# Patient Record
Sex: Male | Born: 1965 | Race: White | Hispanic: No | Marital: Married | Smoking: Never smoker
Health system: Southern US, Community
[De-identification: ages and names within clinical notes are randomized; demographics above are authoritative.]

## PROBLEM LIST (undated history)

## (undated) DIAGNOSIS — I82409 Acute embolism and thrombosis of unspecified deep veins of unspecified lower extremity: Secondary | ICD-10-CM

## (undated) DIAGNOSIS — T7840XA Allergy, unspecified, initial encounter: Secondary | ICD-10-CM

## (undated) DIAGNOSIS — C801 Malignant (primary) neoplasm, unspecified: Secondary | ICD-10-CM

## (undated) HISTORY — DX: Malignant (primary) neoplasm, unspecified: C80.1

## (undated) HISTORY — DX: Allergy, unspecified, initial encounter: T78.40XA

## (undated) HISTORY — PX: THORACOTOMY: SUR1349

---

## 2011-08-06 ENCOUNTER — Ambulatory Visit: Payer: Self-pay | Admitting: Otolaryngology

## 2011-08-08 ENCOUNTER — Ambulatory Visit: Payer: Self-pay | Admitting: Otolaryngology

## 2011-08-13 ENCOUNTER — Inpatient Hospital Stay: Payer: Self-pay | Admitting: Otolaryngology

## 2011-08-13 HISTORY — PX: EXCISION OF TONGUE LESION WITH LASER: SHX5823

## 2011-08-13 HISTORY — PX: NECK DISSECTION: SUR422

## 2011-08-15 LAB — CBC WITH DIFFERENTIAL/PLATELET
Eosinophil #: 0 10*3/uL (ref 0.0–0.7)
HCT: 45.5 % (ref 40.0–52.0)
Lymphocyte #: 1.2 10*3/uL (ref 1.0–3.6)
Lymphocyte %: 14 %
MCH: 33.7 pg (ref 26.0–34.0)
MCHC: 34 g/dL (ref 32.0–36.0)
MCV: 99 fL (ref 80–100)
Monocyte #: 0.6 10*3/uL (ref 0.0–0.7)
Neutrophil #: 6.6 10*3/uL — ABNORMAL HIGH (ref 1.4–6.5)
Neutrophil %: 78 %
Platelet: 188 10*3/uL (ref 150–440)
RBC: 4.59 10*6/uL (ref 4.40–5.90)
RDW: 12.9 % (ref 11.5–14.5)

## 2011-08-15 LAB — BASIC METABOLIC PANEL
Anion Gap: 10 (ref 7–16)
BUN: 7 mg/dL (ref 7–18)
Calcium, Total: 8.6 mg/dL (ref 8.5–10.1)
EGFR (Non-African Amer.): >60
Glucose: 117 mg/dL — ABNORMAL HIGH (ref 65–99)
Osmolality: 273 (ref 275–301)
Potassium: 3.6 mmol/L (ref 3.5–5.1)
Sodium: 137 mmol/L (ref 136–145)

## 2011-08-23 ENCOUNTER — Ambulatory Visit: Payer: Self-pay | Admitting: Cardiothoracic Surgery

## 2011-08-31 DIAGNOSIS — Z85810 Personal history of malignant neoplasm of tongue: Secondary | ICD-10-CM | POA: Insufficient documentation

## 2011-08-31 DIAGNOSIS — R590 Localized enlarged lymph nodes: Secondary | ICD-10-CM | POA: Insufficient documentation

## 2011-09-12 ENCOUNTER — Ambulatory Visit: Payer: Self-pay | Admitting: Cardiothoracic Surgery

## 2011-10-10 LAB — CBC CANCER CENTER
Basophil #: 0 x10 3/mm (ref 0.0–0.1)
Basophil %: 0.6 %
Eosinophil #: 0.1 x10 3/mm (ref 0.0–0.7)
Lymphocyte #: 0.7 x10 3/mm — ABNORMAL LOW (ref 1.0–3.6)
Lymphocyte %: 13.4 %
MCH: 33.2 pg (ref 26.0–34.0)
MCHC: 33.7 g/dL (ref 32.0–36.0)
Monocyte #: 0.4 x10 3/mm (ref 0.2–1.0)
Neutrophil #: 3.8 x10 3/mm (ref 1.4–6.5)
Neutrophil %: 75.8 %
Platelet: 249 x10 3/mm (ref 150–440)
RBC: 4.38 10*6/uL — ABNORMAL LOW (ref 4.40–5.90)

## 2011-10-13 ENCOUNTER — Ambulatory Visit: Payer: Self-pay | Admitting: Cardiothoracic Surgery

## 2011-10-17 LAB — CBC CANCER CENTER
HGB: 15.1 g/dL (ref 13.0–18.0)
Lymphocyte #: 0.4 x10 3/mm — ABNORMAL LOW (ref 1.0–3.6)
Lymphocyte %: 8.4 %
MCH: 33.1 pg (ref 26.0–34.0)
Monocyte %: 6.6 %
Neutrophil #: 3.9 x10 3/mm (ref 1.4–6.5)
Neutrophil %: 82.8 %
RDW: 12.9 % (ref 11.5–14.5)
WBC: 4.7 x10 3/mm (ref 3.8–10.6)

## 2011-10-24 LAB — CBC CANCER CENTER
Eosinophil #: 0.1 x10 3/mm (ref 0.0–0.7)
Eosinophil %: 2.7 %
HGB: 15.7 g/dL (ref 13.0–18.0)
Lymphocyte #: 0.4 x10 3/mm — ABNORMAL LOW (ref 1.0–3.6)
Lymphocyte %: 10.8 %
MCV: 98 fL (ref 80–100)
Monocyte %: 9 %
RDW: 13 % (ref 11.5–14.5)
WBC: 3.8 x10 3/mm (ref 3.8–10.6)

## 2011-10-31 LAB — CBC CANCER CENTER
Basophil #: 0.1 x10 3/mm (ref 0.0–0.1)
Eosinophil #: 0.1 x10 3/mm (ref 0.0–0.7)
Eosinophil %: 2.7 %
HCT: 45.4 % (ref 40.0–52.0)
Lymphocyte #: 0.4 x10 3/mm — ABNORMAL LOW (ref 1.0–3.6)
Lymphocyte %: 9.1 %
MCV: 99 fL (ref 80–100)
Platelet: 207 x10 3/mm (ref 150–440)
RBC: 4.61 10*6/uL (ref 4.40–5.90)
RDW: 12.7 % (ref 11.5–14.5)

## 2011-11-07 LAB — CBC CANCER CENTER
Basophil #: 0 x10 3/mm (ref 0.0–0.1)
Eosinophil #: 0.1 x10 3/mm (ref 0.0–0.7)
Eosinophil %: 3.1 %
HCT: 44 % (ref 40.0–52.0)
Lymphocyte #: 0.2 x10 3/mm — ABNORMAL LOW (ref 1.0–3.6)
Lymphocyte %: 5.4 %
MCH: 33.2 pg (ref 26.0–34.0)
MCHC: 34 g/dL (ref 32.0–36.0)
MCV: 98 fL (ref 80–100)
Monocyte #: 0.4 x10 3/mm (ref 0.2–1.0)
Monocyte %: 7.9 %
Neutrophil #: 3.7 x10 3/mm (ref 1.4–6.5)
RBC: 4.5 10*6/uL (ref 4.40–5.90)
RDW: 12.8 % (ref 11.5–14.5)
WBC: 4.5 x10 3/mm (ref 3.8–10.6)

## 2011-11-12 ENCOUNTER — Ambulatory Visit: Payer: Self-pay | Admitting: Cardiothoracic Surgery

## 2011-11-14 LAB — CBC CANCER CENTER
Eosinophil #: 0 x10 3/mm (ref 0.0–0.7)
HCT: 46.8 % (ref 40.0–52.0)
Lymphocyte #: 0.3 x10 3/mm — ABNORMAL LOW (ref 1.0–3.6)
Lymphocyte %: 6.1 %
MCH: 32.8 pg (ref 26.0–34.0)
MCHC: 33.4 g/dL (ref 32.0–36.0)
Monocyte #: 0.3 x10 3/mm (ref 0.2–1.0)
Neutrophil #: 3.8 x10 3/mm (ref 1.4–6.5)
RDW: 12.8 % (ref 11.5–14.5)

## 2011-12-13 ENCOUNTER — Ambulatory Visit: Payer: Self-pay | Admitting: Cardiothoracic Surgery

## 2012-01-13 ENCOUNTER — Ambulatory Visit: Payer: Self-pay | Admitting: Cardiothoracic Surgery

## 2012-04-24 ENCOUNTER — Ambulatory Visit: Payer: Self-pay | Admitting: Radiation Oncology

## 2012-05-14 ENCOUNTER — Ambulatory Visit: Payer: Self-pay | Admitting: Radiation Oncology

## 2012-05-21 LAB — CBC CANCER CENTER
Basophil %: 0.3 %
Eosinophil %: 0.1 %
HCT: 41.9 % (ref 40.0–52.0)
HGB: 14.9 g/dL (ref 13.0–18.0)
MCH: 34 pg (ref 26.0–34.0)
MCHC: 35.5 g/dL (ref 32.0–36.0)
MCV: 96 fL (ref 80–100)
Monocyte #: 0.6 x10 3/mm (ref 0.2–1.0)
Monocyte %: 10.1 %
Neutrophil #: 4.4 x10 3/mm (ref 1.4–6.5)
RBC: 4.37 10*6/uL — ABNORMAL LOW (ref 4.40–5.90)
RDW: 12.7 % (ref 11.5–14.5)
WBC: 5.5 x10 3/mm (ref 3.8–10.6)

## 2012-06-04 ENCOUNTER — Ambulatory Visit: Payer: Self-pay | Admitting: Radiation Oncology

## 2012-06-14 ENCOUNTER — Ambulatory Visit: Payer: Self-pay | Admitting: Radiation Oncology

## 2012-07-12 ENCOUNTER — Ambulatory Visit: Payer: Self-pay | Admitting: Radiation Oncology

## 2012-08-07 ENCOUNTER — Ambulatory Visit: Payer: Self-pay | Admitting: Otolaryngology

## 2012-08-13 ENCOUNTER — Ambulatory Visit: Payer: Self-pay | Admitting: Otolaryngology

## 2012-08-26 DIAGNOSIS — C029 Malignant neoplasm of tongue, unspecified: Secondary | ICD-10-CM | POA: Insufficient documentation

## 2012-08-27 ENCOUNTER — Ambulatory Visit (INDEPENDENT_AMBULATORY_CARE_PROVIDER_SITE_OTHER): Payer: BC Managed Care – PPO | Admitting: Internal Medicine

## 2012-08-27 ENCOUNTER — Encounter: Payer: Self-pay | Admitting: Internal Medicine

## 2012-08-27 VITALS — BP 132/90 | HR 80 | Temp 97.8°F | Resp 16 | Ht 73.0 in | Wt 201.5 lb

## 2012-08-27 DIAGNOSIS — C021 Malignant neoplasm of border of tongue: Secondary | ICD-10-CM

## 2012-08-27 MED ORDER — ALPRAZOLAM 0.25 MG PO TABS: 0.2500 mg | ORAL_TABLET | Freq: Two times a day (BID) | ORAL | Status: DC | PRN

## 2012-08-27 NOTE — Progress Notes (Signed)
Patient ID: Corey Sims, male   DOB: [birth year], 47 y.o.   MRN: [id number]   Patient Active Problem List  Diagnosis  . Squamous cell carcinoma of lateral tongue    Subjective:  CC:   Chief Complaint  Patient presents with  . Establish Care    HPI:   Corey Sims is a 47 y.o. male who presents as a new patient to establish primary care with the chief complaint of   Past Medical History  Diagnosis Date  . Allergy   . Cancer     tongue and neck    Past Surgical History  Procedure Laterality Date  . Excision of tongue lesion with laser  08/13/11  . Neck dissection Bilateral 08/13/11    Family History  Problem Relation Age of Onset  . Cancer Father 47    prostate     History   Social History  . Marital Status: Married    Spouse Name: N/A    Number of Children: N/A  . Years of Education: N/A   Occupational History  . Not on file.   Social History Main Topics  . Smoking status: Never Smoker   . Smokeless tobacco: Not on file  . Alcohol Use: Yes     Comment: very limited  . Drug Use: No  . Sexually Active: Not on file   Other Topics Concern  . Not on file   Social History Narrative  . No narrative on file       @ALLHX @    Review of Systems:   The remainder of the review of systems was negative except those addressed in the HPI.       Objective:  BP 132/90  Pulse 80  Temp(Src) 97.8 F (36.6 C) (Oral)  Resp 16  Ht 6\' 1"  (1.854 m)  Wt 201 lb 8 oz (91.4 kg)  BMI 26.59 kg/m2  SpO2 98%  General appearance: alert, cooperative and appears stated age Ears: normal TM's and external ear canals both ears Throat: lips, mucosa, normal,  Partial glossectomy noted,  teeth and gums normal Neck: left cervical and suprclavicular swelling. trachea midline, thyroid not enlarged Back: symmetric, no curvature. ROM normal. No CVA tenderness. Lungs: clear to auscultation bilaterally Heart: regular rate and rhythm, S1, S2 normal, no murmur,  click, rub or gallop Abdomen: soft, non-tender; bowel sounds normal; no masses,  no organomegaly Pulses: 2+ and symmetric Skin: Skin color, texture, turgor normal. No rashes or lesions Lymph nodes: Cervical, supraclavicular, and axillary nodes normal.  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 For surgery and intraoperative XRT at [facility] next week followed by chemotherapy .  Alprazolam rx given to help patient rest given his understandible anxiety and anticipated insomnia in the days leading yp to his next surgery .   A total of 45 minutes was spent with patient more than half of which was spent in counseling, reviewing records from other providers and coordination of care.  Updated Medication List Outpatient Encounter Prescriptions as of 08/27/2012  Medication Sig Dispense Refill  . ALPRAZolam (XANAX) 0.25 MG tablet Take 1 tablet (0.25 mg total) by mouth 2 (two) times daily as needed for sleep or anxiety.  30 tablet  2  . chlorhexidine (PERIDEX) 0.12 % solution Use as directed 15 mLs in the mouth or throat 2 (two) times daily.  No facility-administered encounter medications on file as of 08/27/2012.     No orders of the defined types were placed in this encounter.    No Follow-up on file.

## 2012-08-27 NOTE — Patient Instructions (Addendum)
I am giving you a prescription for a mild anxiolytic called xanax,  To have just in case you have a sleepless night or can't get  Your anxiety under control.

## 2012-08-29 ENCOUNTER — Encounter: Payer: Self-pay | Admitting: Internal Medicine

## 2012-08-29 DIAGNOSIS — C021 Malignant neoplasm of border of tongue: Secondary | ICD-10-CM | POA: Insufficient documentation

## 2012-08-29 NOTE — Assessment & Plan Note (Signed)
Now metastatic with left neck swelling, bilateral hilary lymphadenopathy and pulmonary nodules noted on CT and supported by positive PET scans. For surgery and intraoperative XRT at [facility] next week followed by chemotherapy .  Alprazolam rx given to help patient rest given his understandible anxiety and anticipated insomnia in the days leading yp to his next surgery .

## 2012-09-12 ENCOUNTER — Ambulatory Visit: Payer: Self-pay | Admitting: Oncology

## 2012-10-01 ENCOUNTER — Ambulatory Visit: Payer: Self-pay | Admitting: Internal Medicine

## 2012-10-02 ENCOUNTER — Telehealth: Payer: Self-pay | Admitting: Internal Medicine

## 2012-10-02 LAB — PATHOLOGY REPORT

## 2012-10-02 NOTE — Telephone Encounter (Signed)
Patient scheduled for a hospital follow up on 6.6.14

## 2012-10-02 NOTE — Telephone Encounter (Signed)
Called patient to verify hospital followup.

## 2012-10-09 LAB — COMPREHENSIVE METABOLIC PANEL
Albumin: 3.1 g/dL — ABNORMAL LOW (ref 3.4–5.0)
Alkaline Phosphatase: 167 U/L — ABNORMAL HIGH (ref 50–136)
BUN: 11 mg/dL (ref 7–18)
Bilirubin,Total: 0.3 mg/dL (ref 0.2–1.0)
Co2: 31 mmol/L (ref 21–32)
Creatinine: 1.25 mg/dL (ref 0.60–1.30)
EGFR (Non-African Amer.): >60
Osmolality: 275 (ref 275–301)
Potassium: 3.9 mmol/L (ref 3.5–5.1)
Sodium: 138 mmol/L (ref 136–145)
Total Protein: 7.5 g/dL (ref 6.4–8.2)

## 2012-10-09 LAB — CBC CANCER CENTER
Basophil %: 0.9 %
Eosinophil #: 0 x10 3/mm (ref 0.0–0.7)
HGB: 14.9 g/dL (ref 13.0–18.0)
MCH: 32.5 pg (ref 26.0–34.0)
MCHC: 34.3 g/dL (ref 32.0–36.0)
Monocyte #: 0.3 x10 3/mm (ref 0.2–1.0)
Monocyte %: 10.3 %
Neutrophil #: 2.5 x10 3/mm (ref 1.4–6.5)
Neutrophil %: 73.5 %
Platelet: 293 x10 3/mm (ref 150–440)
RBC: 4.58 10*6/uL (ref 4.40–5.90)
RDW: 12.5 % (ref 11.5–14.5)
WBC: 3.3 x10 3/mm — ABNORMAL LOW (ref 3.8–10.6)

## 2012-10-09 LAB — PROTIME-INR
INR: 0.9
Prothrombin Time: 12.6 secs (ref 11.5–14.7)

## 2012-10-09 LAB — APTT: Activated PTT: 28.8 secs (ref 23.6–35.9)

## 2012-10-12 ENCOUNTER — Ambulatory Visit: Payer: Self-pay | Admitting: Oncology

## 2012-10-16 ENCOUNTER — Ambulatory Visit: Payer: Self-pay

## 2012-10-17 ENCOUNTER — Ambulatory Visit: Payer: BC Managed Care – PPO | Admitting: Internal Medicine

## 2012-10-23 ENCOUNTER — Inpatient Hospital Stay: Payer: Self-pay | Admitting: Cardiothoracic Surgery

## 2012-10-24 LAB — BASIC METABOLIC PANEL
Anion Gap: 5 — ABNORMAL LOW (ref 7–16)
BUN: 7 mg/dL (ref 7–18)
Calcium, Total: 8.5 mg/dL (ref 8.5–10.1)
Chloride: 100 mmol/L (ref 98–107)
Co2: 28 mmol/L (ref 21–32)
EGFR (African American): >60
EGFR (Non-African Amer.): >60
Glucose: 138 mg/dL — ABNORMAL HIGH (ref 65–99)
Potassium: 4.1 mmol/L (ref 3.5–5.1)
Sodium: 133 mmol/L — ABNORMAL LOW (ref 136–145)

## 2012-10-24 LAB — CBC WITH DIFFERENTIAL/PLATELET
Basophil %: 0.1 %
Eosinophil %: 0 %
HCT: 41.5 % (ref 40.0–52.0)
HGB: 14.2 g/dL (ref 13.0–18.0)
MCH: 32.5 pg (ref 26.0–34.0)
Monocyte #: 0.4 x10 3/mm (ref 0.2–1.0)
Monocyte %: 5.2 %
Neutrophil #: 7.2 10*3/uL — ABNORMAL HIGH (ref 1.4–6.5)
Neutrophil %: 90.7 %
RDW: 13.2 % (ref 11.5–14.5)
WBC: 7.9 10*3/uL (ref 3.8–10.6)

## 2012-11-11 ENCOUNTER — Ambulatory Visit: Payer: Self-pay | Admitting: Oncology

## 2012-11-17 LAB — PATHOLOGY REPORT

## 2012-11-25 LAB — CBC CANCER CENTER
Basophil #: 0 x10 3/mm (ref 0.0–0.1)
Basophil %: 0.7 %
Eosinophil #: 0.1 x10 3/mm (ref 0.0–0.7)
HCT: 43.1 % (ref 40.0–52.0)
Lymphocyte #: 0.6 x10 3/mm — ABNORMAL LOW (ref 1.0–3.6)
MCHC: 34.8 g/dL (ref 32.0–36.0)
MCV: 94 fL (ref 80–100)
Monocyte #: 0.4 x10 3/mm (ref 0.2–1.0)
Monocyte %: 7.6 %
Platelet: 234 x10 3/mm (ref 150–440)
RDW: 13.1 % (ref 11.5–14.5)
WBC: 4.9 x10 3/mm (ref 3.8–10.6)

## 2012-11-25 LAB — COMPREHENSIVE METABOLIC PANEL
Alkaline Phosphatase: 192 U/L — ABNORMAL HIGH (ref 50–136)
BUN: 12 mg/dL (ref 7–18)
Calcium, Total: 9.2 mg/dL (ref 8.5–10.1)
Chloride: 101 mmol/L (ref 98–107)
Osmolality: 280 (ref 275–301)
Potassium: 4 mmol/L (ref 3.5–5.1)
SGOT(AST): 15 U/L (ref 15–37)
Sodium: 139 mmol/L (ref 136–145)
Total Protein: 7.3 g/dL (ref 6.4–8.2)

## 2012-11-27 LAB — CBC CANCER CENTER
Basophil #: 0 x10 3/mm (ref 0.0–0.1)
Eosinophil %: 2.9 %
HGB: 14.8 g/dL (ref 13.0–18.0)
Lymphocyte #: 0.5 x10 3/mm — ABNORMAL LOW (ref 1.0–3.6)
Lymphocyte %: 8.7 %
MCH: 32.5 pg (ref 26.0–34.0)
MCHC: 34.7 g/dL (ref 32.0–36.0)
MCV: 94 fL (ref 80–100)
Monocyte #: 0.3 x10 3/mm (ref 0.2–1.0)
Monocyte %: 5.5 %
Platelet: 257 x10 3/mm (ref 150–440)
RBC: 4.55 10*6/uL (ref 4.40–5.90)
RDW: 13.2 % (ref 11.5–14.5)

## 2012-12-06 ENCOUNTER — Emergency Department: Payer: Self-pay | Admitting: Emergency Medicine

## 2012-12-06 LAB — MAGNESIUM: Magnesium: 2.4 mg/dL

## 2012-12-06 LAB — BASIC METABOLIC PANEL
Anion Gap: 2 — ABNORMAL LOW (ref 7–16)
Co2: 32 mmol/L (ref 21–32)
EGFR (Non-African Amer.): >60
Glucose: 100 mg/dL — ABNORMAL HIGH (ref 65–99)
Osmolality: 268 (ref 275–301)
Sodium: 134 mmol/L — ABNORMAL LOW (ref 136–145)

## 2012-12-06 LAB — CBC
HCT: 42.7 % (ref 40.0–52.0)
MCH: 32.2 pg (ref 26.0–34.0)
MCHC: 34.7 g/dL (ref 32.0–36.0)
MCV: 93 fL (ref 80–100)
RBC: 4.61 10*6/uL (ref 4.40–5.90)
RDW: 12.8 % (ref 11.5–14.5)

## 2012-12-09 LAB — CBC CANCER CENTER
Basophil #: 0 x10 3/mm (ref 0.0–0.1)
Basophil %: 0.6 %
Eosinophil #: 0 x10 3/mm (ref 0.0–0.7)
Eosinophil %: 1 %
Lymphocyte #: 0.3 x10 3/mm — ABNORMAL LOW (ref 1.0–3.6)
Lymphocyte %: 8 %
MCH: 32.9 pg (ref 26.0–34.0)
MCHC: 35.3 g/dL (ref 32.0–36.0)
Neutrophil %: 82.9 %

## 2012-12-09 LAB — COMPREHENSIVE METABOLIC PANEL
Albumin: 3 g/dL — ABNORMAL LOW (ref 3.4–5.0)
Alkaline Phosphatase: 203 U/L — ABNORMAL HIGH (ref 50–136)
BUN: 11 mg/dL (ref 7–18)
Calcium, Total: 8.8 mg/dL (ref 8.5–10.1)
Chloride: 100 mmol/L (ref 98–107)
Creatinine: 1.01 mg/dL (ref 0.60–1.30)
EGFR (African American): >60
EGFR (Non-African Amer.): >60
SGPT (ALT): 46 U/L (ref 12–78)
Total Protein: 7.3 g/dL (ref 6.4–8.2)

## 2012-12-09 LAB — MAGNESIUM: Magnesium: 2.3 mg/dL

## 2012-12-12 ENCOUNTER — Ambulatory Visit: Payer: Self-pay | Admitting: Oncology

## 2012-12-16 LAB — COMPREHENSIVE METABOLIC PANEL
Albumin: 3.3 g/dL — ABNORMAL LOW (ref 3.4–5.0)
Bilirubin,Total: 0.4 mg/dL (ref 0.2–1.0)
Chloride: 101 mmol/L (ref 98–107)
Creatinine: 1 mg/dL (ref 0.60–1.30)
EGFR (African American): >60
EGFR (Non-African Amer.): >60
Glucose: 123 mg/dL — ABNORMAL HIGH (ref 65–99)
Osmolality: 275 (ref 275–301)

## 2012-12-16 LAB — CBC CANCER CENTER
Lymphocyte #: 0.3 x10 3/mm — ABNORMAL LOW (ref 1.0–3.6)
Lymphocyte %: 10.3 %
MCH: 33 pg (ref 26.0–34.0)
MCHC: 35.1 g/dL (ref 32.0–36.0)
Monocyte %: 8.2 %
Platelet: 305 x10 3/mm (ref 150–440)
RBC: 4.67 10*6/uL (ref 4.40–5.90)

## 2012-12-16 LAB — MAGNESIUM: Magnesium: 2.2 mg/dL

## 2012-12-23 LAB — COMPREHENSIVE METABOLIC PANEL
Calcium, Total: 9.3 mg/dL (ref 8.5–10.1)
Chloride: 101 mmol/L (ref 98–107)
Co2: 33 mmol/L — ABNORMAL HIGH (ref 21–32)
Creatinine: 0.98 mg/dL (ref 0.60–1.30)
EGFR (Non-African Amer.): >60
Glucose: 106 mg/dL — ABNORMAL HIGH (ref 65–99)
SGOT(AST): 19 U/L (ref 15–37)
SGPT (ALT): 38 U/L (ref 12–78)
Sodium: 140 mmol/L (ref 136–145)

## 2012-12-23 LAB — CBC CANCER CENTER
Basophil #: 0 x10 3/mm (ref 0.0–0.1)
Eosinophil #: 0.1 x10 3/mm (ref 0.0–0.7)
HGB: 14.8 g/dL (ref 13.0–18.0)
Lymphocyte %: 13.2 %
MCHC: 34.8 g/dL (ref 32.0–36.0)
MCV: 95 fL (ref 80–100)
Monocyte #: 0.2 x10 3/mm (ref 0.2–1.0)
Monocyte %: 7.4 %
Neutrophil %: 76.1 %
Platelet: 180 x10 3/mm (ref 150–440)
RBC: 4.47 10*6/uL (ref 4.40–5.90)
WBC: 2.3 x10 3/mm — ABNORMAL LOW (ref 3.8–10.6)

## 2012-12-23 LAB — MAGNESIUM: Magnesium: 2.1 mg/dL

## 2012-12-30 LAB — CBC CANCER CENTER
Basophil #: 0 x10 3/mm (ref 0.0–0.1)
Eosinophil #: 0 x10 3/mm (ref 0.0–0.7)
Eosinophil %: 0.4 %
HCT: 41.2 % (ref 40.0–52.0)
Lymphocyte %: 11.5 %
MCH: 33.4 pg (ref 26.0–34.0)
MCHC: 35.4 g/dL (ref 32.0–36.0)
Monocyte %: 7.5 %
Neutrophil #: 2.1 x10 3/mm (ref 1.4–6.5)
Neutrophil %: 79.8 %
Platelet: 167 x10 3/mm (ref 150–440)
RBC: 4.37 10*6/uL — ABNORMAL LOW (ref 4.40–5.90)

## 2012-12-30 LAB — COMPREHENSIVE METABOLIC PANEL
Albumin: 3.2 g/dL — ABNORMAL LOW (ref 3.4–5.0)
Creatinine: 1.1 mg/dL (ref 0.60–1.30)
EGFR (African American): >60
Glucose: 104 mg/dL — ABNORMAL HIGH (ref 65–99)
Osmolality: 274 (ref 275–301)
SGOT(AST): 22 U/L (ref 15–37)
Sodium: 137 mmol/L (ref 136–145)

## 2013-01-12 ENCOUNTER — Ambulatory Visit: Payer: Self-pay | Admitting: Oncology

## 2013-02-03 ENCOUNTER — Ambulatory Visit: Payer: Self-pay | Admitting: Otolaryngology

## 2013-02-03 LAB — COMPREHENSIVE METABOLIC PANEL
Albumin: 3.1 g/dL — ABNORMAL LOW (ref 3.4–5.0)
Alkaline Phosphatase: 175 U/L — ABNORMAL HIGH (ref 50–136)
Anion Gap: 7 (ref 7–16)
Bilirubin,Total: 0.3 mg/dL (ref 0.2–1.0)
Chloride: 100 mmol/L (ref 98–107)
Creatinine: 0.96 mg/dL (ref 0.60–1.30)
EGFR (African American): >60
Glucose: 81 mg/dL (ref 65–99)
Potassium: 3.7 mmol/L (ref 3.5–5.1)
SGOT(AST): 16 U/L (ref 15–37)
Sodium: 139 mmol/L (ref 136–145)

## 2013-02-03 LAB — CBC CANCER CENTER
Eosinophil %: 1 %
HGB: 13.6 g/dL (ref 13.0–18.0)
Lymphocyte #: 0.3 x10 3/mm — ABNORMAL LOW (ref 1.0–3.6)
Lymphocyte %: 7.5 %
MCH: 33.6 pg (ref 26.0–34.0)
MCV: 99 fL (ref 80–100)
Monocyte %: 8.7 %
Neutrophil #: 3.8 x10 3/mm (ref 1.4–6.5)
Neutrophil %: 82.2 %
RBC: 4.05 10*6/uL — ABNORMAL LOW (ref 4.40–5.90)
RDW: 17.1 % — ABNORMAL HIGH (ref 11.5–14.5)

## 2013-02-03 LAB — MAGNESIUM: Magnesium: 2.3 mg/dL

## 2013-02-09 ENCOUNTER — Ambulatory Visit: Payer: Self-pay | Admitting: Oncology

## 2013-02-10 ENCOUNTER — Ambulatory Visit: Payer: Self-pay | Admitting: Otolaryngology

## 2013-02-10 LAB — PROTIME-INR
INR: 1
Prothrombin Time: 13.5 secs (ref 11.5–14.7)

## 2013-02-11 ENCOUNTER — Ambulatory Visit: Payer: Self-pay | Admitting: Oncology

## 2013-02-17 ENCOUNTER — Ambulatory Visit: Payer: Self-pay | Admitting: Vascular Surgery

## 2013-02-17 LAB — MISC AER/ANAEROBIC CULT.

## 2013-02-18 LAB — CBC CANCER CENTER
Basophil #: 0 x10 3/mm (ref 0.0–0.1)
Eosinophil #: 0 x10 3/mm (ref 0.0–0.7)
HGB: 13.7 g/dL (ref 13.0–18.0)
Lymphocyte #: 0.2 x10 3/mm — ABNORMAL LOW (ref 1.0–3.6)
Lymphocyte %: 2.9 %
MCHC: 34.4 g/dL (ref 32.0–36.0)
MCV: 98 fL (ref 80–100)
Monocyte #: 0.2 x10 3/mm (ref 0.2–1.0)
Monocyte %: 3 %
Platelet: 368 x10 3/mm (ref 150–440)
RBC: 4.05 10*6/uL — ABNORMAL LOW (ref 4.40–5.90)
RDW: 15.6 % — ABNORMAL HIGH (ref 11.5–14.5)
WBC: 6.3 x10 3/mm (ref 3.8–10.6)

## 2013-02-18 LAB — BASIC METABOLIC PANEL
Anion Gap: 11 (ref 7–16)
BUN: 13 mg/dL (ref 7–18)
Chloride: 98 mmol/L (ref 98–107)
Co2: 26 mmol/L (ref 21–32)
EGFR (African American): >60
EGFR (Non-African Amer.): >60
Glucose: 200 mg/dL — ABNORMAL HIGH (ref 65–99)
Osmolality: 276 (ref 275–301)
Sodium: 135 mmol/L — ABNORMAL LOW (ref 136–145)

## 2013-02-25 LAB — CBC CANCER CENTER
Eosinophil #: 0 x10 3/mm (ref 0.0–0.7)
Eosinophil %: 5.1 %
HCT: 40 % (ref 40.0–52.0)
HGB: 13.8 g/dL (ref 13.0–18.0)
Lymphocyte %: 31.6 %
MCH: 33.7 pg (ref 26.0–34.0)
MCV: 98 fL (ref 80–100)
Monocyte %: 21.1 %
Neutrophil #: 0.3 x10 3/mm — ABNORMAL LOW (ref 1.4–6.5)
Neutrophil %: 39.2 %
WBC: 0.8 x10 3/mm — CL (ref 3.8–10.6)

## 2013-02-25 LAB — MAGNESIUM: Magnesium: 1.8 mg/dL

## 2013-03-04 LAB — CBC CANCER CENTER
Eosinophil #: 0 x10 3/mm (ref 0.0–0.7)
HCT: 37.4 % — ABNORMAL LOW (ref 40.0–52.0)
HGB: 12.7 g/dL — ABNORMAL LOW (ref 13.0–18.0)
Lymphocyte #: 0.5 x10 3/mm — ABNORMAL LOW (ref 1.0–3.6)
MCH: 33.5 pg (ref 26.0–34.0)
MCHC: 34 g/dL (ref 32.0–36.0)
Monocyte #: 0.6 x10 3/mm (ref 0.2–1.0)
Neutrophil #: 11 x10 3/mm — ABNORMAL HIGH (ref 1.4–6.5)
RDW: 15.5 % — ABNORMAL HIGH (ref 11.5–14.5)
WBC: 12.2 x10 3/mm — ABNORMAL HIGH (ref 3.8–10.6)

## 2013-03-06 LAB — PATHOLOGY REPORT

## 2013-03-12 LAB — CBC CANCER CENTER
Basophil %: 0.3 %
Eosinophil %: 0 %
HGB: 12.5 g/dL — ABNORMAL LOW (ref 13.0–18.0)
Lymphocyte #: 0.2 x10 3/mm — ABNORMAL LOW (ref 1.0–3.6)
Lymphocyte %: 4.2 %
MCH: 33.5 pg (ref 26.0–34.0)
MCHC: 33.7 g/dL (ref 32.0–36.0)
MCV: 99 fL (ref 80–100)
Monocyte #: 0.3 x10 3/mm (ref 0.2–1.0)
Monocyte %: 4.7 %
Neutrophil #: 4.9 x10 3/mm (ref 1.4–6.5)
Neutrophil %: 90.8 %
Platelet: 319 x10 3/mm (ref 150–440)

## 2013-03-12 LAB — COMPREHENSIVE METABOLIC PANEL
Alkaline Phosphatase: 174 U/L — ABNORMAL HIGH (ref 50–136)
Anion Gap: 10 (ref 7–16)
Calcium, Total: 8.9 mg/dL (ref 8.5–10.1)
Creatinine: 1.04 mg/dL (ref 0.60–1.30)
EGFR (Non-African Amer.): >60
Osmolality: 279 (ref 275–301)
SGOT(AST): 13 U/L — ABNORMAL LOW (ref 15–37)
Sodium: 139 mmol/L (ref 136–145)
Total Protein: 7.4 g/dL (ref 6.4–8.2)

## 2013-03-12 LAB — MAGNESIUM: Magnesium: 2.2 mg/dL

## 2013-03-14 ENCOUNTER — Ambulatory Visit: Payer: Self-pay | Admitting: Oncology

## 2013-03-19 LAB — CBC CANCER CENTER
Basophil #: 0 x10 3/mm (ref 0.0–0.1)
Basophil %: 1.8 %
Eosinophil #: 0 x10 3/mm (ref 0.0–0.7)
Eosinophil %: 2.6 %
HCT: 38.1 % — ABNORMAL LOW (ref 40.0–52.0)
MCH: 33 pg (ref 26.0–34.0)
Monocyte #: 0.2 x10 3/mm (ref 0.2–1.0)
Neutrophil #: 0.8 x10 3/mm — ABNORMAL LOW (ref 1.4–6.5)
RDW: 14.5 % (ref 11.5–14.5)
WBC: 1.5 x10 3/mm — CL (ref 3.8–10.6)

## 2013-03-26 LAB — CBC CANCER CENTER
Basophil #: 0.1 x10 3/mm (ref 0.0–0.1)
Basophil %: 0.8 %
Eosinophil #: 0 x10 3/mm (ref 0.0–0.7)
Eosinophil %: 0 %
HCT: 35.3 % — ABNORMAL LOW (ref 40.0–52.0)
HGB: 11.9 g/dL — ABNORMAL LOW (ref 13.0–18.0)
Lymphocyte #: 0.3 x10 3/mm — ABNORMAL LOW (ref 1.0–3.6)
MCH: 33.6 pg (ref 26.0–34.0)
MCHC: 33.7 g/dL (ref 32.0–36.0)
Monocyte #: 0.5 x10 3/mm (ref 0.2–1.0)
Neutrophil %: 91.1 %
RBC: 3.53 10*6/uL — ABNORMAL LOW (ref 4.40–5.90)
RDW: 15.5 % — ABNORMAL HIGH (ref 11.5–14.5)

## 2013-04-02 LAB — COMPREHENSIVE METABOLIC PANEL
BUN: 8 mg/dL (ref 7–18)
Bilirubin,Total: 0.3 mg/dL (ref 0.2–1.0)
Calcium, Total: 8.8 mg/dL (ref 8.5–10.1)
Chloride: 103 mmol/L (ref 98–107)
Co2: 29 mmol/L (ref 21–32)
Creatinine: 0.98 mg/dL (ref 0.60–1.30)
EGFR (African American): >60
EGFR (Non-African Amer.): >60
Glucose: 117 mg/dL — ABNORMAL HIGH (ref 65–99)
Osmolality: 281 (ref 275–301)
SGPT (ALT): 16 U/L (ref 12–78)
Sodium: 141 mmol/L (ref 136–145)
Total Protein: 6.5 g/dL (ref 6.4–8.2)

## 2013-04-02 LAB — CBC CANCER CENTER
Basophil %: 0.8 %
Eosinophil #: 0 x10 3/mm (ref 0.0–0.7)
Eosinophil %: 0.1 %
HGB: 11.5 g/dL — ABNORMAL LOW (ref 13.0–18.0)
MCV: 102 fL — ABNORMAL HIGH (ref 80–100)
Monocyte %: 8.5 %
Neutrophil #: 3 x10 3/mm (ref 1.4–6.5)
Neutrophil %: 82.2 %
Platelet: 255 x10 3/mm (ref 150–440)
RBC: 3.4 10*6/uL — ABNORMAL LOW (ref 4.40–5.90)
RDW: 15.6 % — ABNORMAL HIGH (ref 11.5–14.5)
WBC: 3.6 x10 3/mm — ABNORMAL LOW (ref 3.8–10.6)

## 2013-04-08 LAB — CBC CANCER CENTER
Basophil %: 1 %
HCT: 35.8 % — ABNORMAL LOW (ref 40.0–52.0)
HGB: 12 g/dL — ABNORMAL LOW (ref 13.0–18.0)
Lymphocyte #: 0.3 x10 3/mm — ABNORMAL LOW (ref 1.0–3.6)
Lymphocyte %: 7.4 %
MCH: 33.8 pg (ref 26.0–34.0)
MCHC: 33.4 g/dL (ref 32.0–36.0)
MCV: 101 fL — ABNORMAL HIGH (ref 80–100)
Monocyte #: 0.1 x10 3/mm — ABNORMAL LOW (ref 0.2–1.0)
Monocyte %: 1.5 %
Neutrophil #: 3.2 x10 3/mm (ref 1.4–6.5)
Neutrophil %: 89.4 %
Platelet: 236 x10 3/mm (ref 150–440)
RDW: 15.8 % — ABNORMAL HIGH (ref 11.5–14.5)
WBC: 3.6 x10 3/mm — ABNORMAL LOW (ref 3.8–10.6)

## 2013-04-13 ENCOUNTER — Ambulatory Visit: Payer: Self-pay | Admitting: Oncology

## 2013-04-16 LAB — CBC CANCER CENTER
Basophil %: 0.5 %
Eosinophil %: 0.2 %
HGB: 11.3 g/dL — ABNORMAL LOW (ref 13.0–18.0)
Lymphocyte #: 0.4 x10 3/mm — ABNORMAL LOW (ref 1.0–3.6)
Lymphocyte %: 3.9 %
MCH: 34.4 pg — ABNORMAL HIGH (ref 26.0–34.0)
MCHC: 33.8 g/dL (ref 32.0–36.0)
Monocyte #: 0.5 x10 3/mm (ref 0.2–1.0)
Monocyte %: 4.5 %
Neutrophil #: 9.7 x10 3/mm — ABNORMAL HIGH (ref 1.4–6.5)
Neutrophil %: 90.9 %
Platelet: 196 x10 3/mm (ref 150–440)
RBC: 3.28 10*6/uL — ABNORMAL LOW (ref 4.40–5.90)
RDW: 16.8 % — ABNORMAL HIGH (ref 11.5–14.5)

## 2013-04-22 ENCOUNTER — Ambulatory Visit: Payer: Self-pay | Admitting: Oncology

## 2013-04-23 LAB — COMPREHENSIVE METABOLIC PANEL
BUN: 13 mg/dL (ref 7–18)
Bilirubin,Total: 0.3 mg/dL (ref 0.2–1.0)
Calcium, Total: 8.7 mg/dL (ref 8.5–10.1)
Chloride: 104 mmol/L (ref 98–107)
Co2: 26 mmol/L (ref 21–32)
EGFR (African American): >60
EGFR (Non-African Amer.): >60
Glucose: 163 mg/dL — ABNORMAL HIGH (ref 65–99)
SGOT(AST): 9 U/L — ABNORMAL LOW (ref 15–37)
SGPT (ALT): 14 U/L (ref 12–78)
Sodium: 140 mmol/L (ref 136–145)
Total Protein: 6.7 g/dL (ref 6.4–8.2)

## 2013-04-23 LAB — CBC CANCER CENTER
Basophil #: 0 x10 3/mm (ref 0.0–0.1)
Basophil %: 0.5 %
Eosinophil %: 0.1 %
HCT: 33.7 % — ABNORMAL LOW (ref 40.0–52.0)
HGB: 11.2 g/dL — ABNORMAL LOW (ref 13.0–18.0)
MCH: 34.5 pg — ABNORMAL HIGH (ref 26.0–34.0)
MCV: 104 fL — ABNORMAL HIGH (ref 80–100)
Monocyte #: 0.2 x10 3/mm (ref 0.2–1.0)
Neutrophil #: 3.8 x10 3/mm (ref 1.4–6.5)
Neutrophil %: 89.2 %
Platelet: 308 x10 3/mm (ref 150–440)
RBC: 3.25 10*6/uL — ABNORMAL LOW (ref 4.40–5.90)
RDW: 17.1 % — ABNORMAL HIGH (ref 11.5–14.5)
WBC: 4.2 x10 3/mm (ref 3.8–10.6)

## 2013-04-30 LAB — CBC CANCER CENTER
Basophil #: 0 x10 3/mm (ref 0.0–0.1)
Basophil %: 1.7 %
Eosinophil #: 0 x10 3/mm (ref 0.0–0.7)
Eosinophil %: 1.3 %
HCT: 32 % — ABNORMAL LOW (ref 40.0–52.0)
Lymphocyte #: 0.4 x10 3/mm — ABNORMAL LOW (ref 1.0–3.6)
Lymphocyte %: 16.2 %
MCH: 34.3 pg — ABNORMAL HIGH (ref 26.0–34.0)
MCHC: 33.5 g/dL (ref 32.0–36.0)
MCV: 103 fL — ABNORMAL HIGH (ref 80–100)
Monocyte %: 9.9 %
Neutrophil #: 1.7 x10 3/mm (ref 1.4–6.5)
Neutrophil %: 70.9 %
RBC: 3.13 10*6/uL — ABNORMAL LOW (ref 4.40–5.90)
RDW: 16.8 % — ABNORMAL HIGH (ref 11.5–14.5)
WBC: 2.3 x10 3/mm — ABNORMAL LOW (ref 3.8–10.6)

## 2013-05-04 LAB — COMPREHENSIVE METABOLIC PANEL
Albumin: 3.1 g/dL — ABNORMAL LOW (ref 3.4–5.0)
Alkaline Phosphatase: 173 U/L — ABNORMAL HIGH
Anion Gap: 8 (ref 7–16)
Bilirubin,Total: 0.4 mg/dL (ref 0.2–1.0)
Chloride: 99 mmol/L (ref 98–107)
EGFR (African American): >60
Potassium: 3.7 mmol/L (ref 3.5–5.1)
SGOT(AST): 15 U/L (ref 15–37)
SGPT (ALT): 21 U/L (ref 12–78)
Sodium: 136 mmol/L (ref 136–145)
Total Protein: 6.7 g/dL (ref 6.4–8.2)

## 2013-05-04 LAB — CBC CANCER CENTER
Basophil #: 0 x10 3/mm (ref 0.0–0.1)
Basophil %: 0.4 %
Eosinophil #: 0 x10 3/mm (ref 0.0–0.7)
Eosinophil %: 0.1 %
HCT: 35.9 % — ABNORMAL LOW (ref 40.0–52.0)
HGB: 11.6 g/dL — ABNORMAL LOW (ref 13.0–18.0)
Lymphocyte %: 4 %
MCV: 104 fL — ABNORMAL HIGH (ref 80–100)
Monocyte #: 0.9 x10 3/mm (ref 0.2–1.0)
Neutrophil #: 10.1 x10 3/mm — ABNORMAL HIGH (ref 1.4–6.5)
Neutrophil %: 87.5 %
RBC: 3.44 10*6/uL — ABNORMAL LOW (ref 4.40–5.90)
RDW: 17.7 % — ABNORMAL HIGH (ref 11.5–14.5)
WBC: 11.5 x10 3/mm — ABNORMAL HIGH (ref 3.8–10.6)

## 2013-05-09 LAB — CULTURE, BLOOD (SINGLE)

## 2013-05-13 LAB — CBC CANCER CENTER
Basophil #: 0 x10 3/mm (ref 0.0–0.1)
Basophil %: 0.6 %
Eosinophil #: 0 x10 3/mm (ref 0.0–0.7)
Eosinophil %: 0.1 %
HCT: 33.4 % — ABNORMAL LOW (ref 40.0–52.0)
Lymphocyte #: 0.3 x10 3/mm — ABNORMAL LOW (ref 1.0–3.6)
MCH: 34.6 pg — ABNORMAL HIGH (ref 26.0–34.0)
MCHC: 33.3 g/dL (ref 32.0–36.0)
MCV: 104 fL — ABNORMAL HIGH (ref 80–100)
Monocyte #: 0.2 x10 3/mm (ref 0.2–1.0)
Monocyte %: 6.5 %
Neutrophil %: 84 %
RDW: 17.5 % — ABNORMAL HIGH (ref 11.5–14.5)

## 2013-05-14 ENCOUNTER — Ambulatory Visit: Payer: Self-pay | Admitting: Oncology

## 2013-05-21 LAB — COMPREHENSIVE METABOLIC PANEL
ALBUMIN: 3.1 g/dL — AB (ref 3.4–5.0)
ALK PHOS: 138 U/L — AB
ALT: 15 U/L (ref 12–78)
ANION GAP: 11 (ref 7–16)
BUN: 9 mg/dL (ref 7–18)
Bilirubin,Total: 0.2 mg/dL (ref 0.2–1.0)
CO2: 25 mmol/L (ref 21–32)
CREATININE: 1 mg/dL (ref 0.60–1.30)
Calcium, Total: 9.1 mg/dL (ref 8.5–10.1)
Chloride: 103 mmol/L (ref 98–107)
Glucose: 168 mg/dL — ABNORMAL HIGH (ref 65–99)
OSMOLALITY: 280 (ref 275–301)
Potassium: 4.1 mmol/L (ref 3.5–5.1)
SGOT(AST): 13 U/L — ABNORMAL LOW (ref 15–37)
Sodium: 139 mmol/L (ref 136–145)
Total Protein: 6.7 g/dL (ref 6.4–8.2)

## 2013-05-21 LAB — CBC CANCER CENTER
Basophil #: 0 x10 3/mm (ref 0.0–0.1)
Basophil %: 0.2 %
EOS ABS: 0 x10 3/mm (ref 0.0–0.7)
Eosinophil %: 0 %
HCT: 34.2 % — ABNORMAL LOW (ref 40.0–52.0)
HGB: 11.1 g/dL — ABNORMAL LOW (ref 13.0–18.0)
Lymphocyte #: 0.3 x10 3/mm — ABNORMAL LOW (ref 1.0–3.6)
Lymphocyte %: 5.8 %
MCH: 34.4 pg — ABNORMAL HIGH (ref 26.0–34.0)
MCHC: 32.4 g/dL (ref 32.0–36.0)
MCV: 106 fL — ABNORMAL HIGH (ref 80–100)
MONO ABS: 0.3 x10 3/mm (ref 0.2–1.0)
MONOS PCT: 5.9 %
Neutrophil #: 4.1 x10 3/mm (ref 1.4–6.5)
Neutrophil %: 88.1 %
Platelet: 314 x10 3/mm (ref 150–440)
RBC: 3.22 10*6/uL — ABNORMAL LOW (ref 4.40–5.90)
RDW: 17.7 % — ABNORMAL HIGH (ref 11.5–14.5)
WBC: 4.7 x10 3/mm (ref 3.8–10.6)

## 2013-05-21 LAB — MAGNESIUM: Magnesium: 1.9 mg/dL

## 2013-05-27 LAB — CBC CANCER CENTER
BASOS ABS: 0 x10 3/mm (ref 0.0–0.1)
Basophil %: 0.1 %
EOS PCT: 2.2 %
Eosinophil #: 0.1 x10 3/mm (ref 0.0–0.7)
HCT: 33 % — ABNORMAL LOW (ref 40.0–52.0)
HGB: 10.8 g/dL — AB (ref 13.0–18.0)
LYMPHS ABS: 0.3 x10 3/mm — AB (ref 1.0–3.6)
Lymphocyte %: 9 %
MCH: 34.2 pg — ABNORMAL HIGH (ref 26.0–34.0)
MCHC: 32.9 g/dL (ref 32.0–36.0)
MCV: 104 fL — AB (ref 80–100)
MONOS PCT: 1.3 %
Monocyte #: 0 x10 3/mm — ABNORMAL LOW (ref 0.2–1.0)
Neutrophil #: 2.9 x10 3/mm (ref 1.4–6.5)
Neutrophil %: 87.4 %
PLATELETS: 213 x10 3/mm (ref 150–440)
RBC: 3.17 10*6/uL — AB (ref 4.40–5.90)
RDW: 16.5 % — AB (ref 11.5–14.5)
WBC: 3.4 x10 3/mm — ABNORMAL LOW (ref 3.8–10.6)

## 2013-06-04 LAB — CBC CANCER CENTER
BASOS ABS: 0 x10 3/mm (ref 0.0–0.1)
BASOS PCT: 0.6 %
EOS PCT: 0.1 %
Eosinophil #: 0 x10 3/mm (ref 0.0–0.7)
HCT: 33.1 % — ABNORMAL LOW (ref 40.0–52.0)
HGB: 11.1 g/dL — AB (ref 13.0–18.0)
LYMPHS PCT: 5.4 %
Lymphocyte #: 0.4 x10 3/mm — ABNORMAL LOW (ref 1.0–3.6)
MCH: 35.5 pg — ABNORMAL HIGH (ref 26.0–34.0)
MCHC: 33.5 g/dL (ref 32.0–36.0)
MCV: 106 fL — AB (ref 80–100)
MONOS PCT: 4.8 %
Monocyte #: 0.4 x10 3/mm (ref 0.2–1.0)
NEUTROS PCT: 89.1 %
Neutrophil #: 6.8 x10 3/mm — ABNORMAL HIGH (ref 1.4–6.5)
PLATELETS: 144 x10 3/mm — AB (ref 150–440)
RBC: 3.12 10*6/uL — ABNORMAL LOW (ref 4.40–5.90)
RDW: 16.7 % — AB (ref 11.5–14.5)
WBC: 7.6 x10 3/mm (ref 3.8–10.6)

## 2013-06-08 ENCOUNTER — Ambulatory Visit: Payer: Self-pay | Admitting: Oncology

## 2013-06-11 LAB — CBC CANCER CENTER
BASOS PCT: 0.3 %
Basophil #: 0 x10 3/mm (ref 0.0–0.1)
EOS PCT: 0 %
Eosinophil #: 0 x10 3/mm (ref 0.0–0.7)
HCT: 32.4 % — ABNORMAL LOW (ref 40.0–52.0)
HGB: 10.8 g/dL — ABNORMAL LOW (ref 13.0–18.0)
LYMPHS PCT: 4.5 %
Lymphocyte #: 0.2 x10 3/mm — ABNORMAL LOW (ref 1.0–3.6)
MCH: 35 pg — ABNORMAL HIGH (ref 26.0–34.0)
MCHC: 33.4 g/dL (ref 32.0–36.0)
MCV: 105 fL — ABNORMAL HIGH (ref 80–100)
MONOS PCT: 3 %
Monocyte #: 0.1 x10 3/mm — ABNORMAL LOW (ref 0.2–1.0)
NEUTROS ABS: 3.1 x10 3/mm (ref 1.4–6.5)
Neutrophil %: 92.2 %
PLATELETS: 296 x10 3/mm (ref 150–440)
RBC: 3.09 10*6/uL — AB (ref 4.40–5.90)
RDW: 15.9 % — AB (ref 11.5–14.5)
WBC: 3.4 x10 3/mm — ABNORMAL LOW (ref 3.8–10.6)

## 2013-06-11 LAB — COMPREHENSIVE METABOLIC PANEL
ALBUMIN: 2.9 g/dL — AB (ref 3.4–5.0)
ALK PHOS: 124 U/L — AB
ALT: 13 U/L (ref 12–78)
Anion Gap: 11 (ref 7–16)
BUN: 10 mg/dL (ref 7–18)
Bilirubin,Total: 0.3 mg/dL (ref 0.2–1.0)
CHLORIDE: 103 mmol/L (ref 98–107)
CREATININE: 1.06 mg/dL (ref 0.60–1.30)
Calcium, Total: 9 mg/dL (ref 8.5–10.1)
Co2: 25 mmol/L (ref 21–32)
EGFR (African American): >60
EGFR (Non-African Amer.): >60
GLUCOSE: 203 mg/dL — AB (ref 65–99)
Osmolality: 282 (ref 275–301)
POTASSIUM: 4.1 mmol/L (ref 3.5–5.1)
SGOT(AST): 9 U/L — ABNORMAL LOW (ref 15–37)
SODIUM: 139 mmol/L (ref 136–145)
Total Protein: 6.9 g/dL (ref 6.4–8.2)

## 2013-06-11 LAB — MAGNESIUM: Magnesium: 2.1 mg/dL

## 2013-06-14 ENCOUNTER — Ambulatory Visit: Payer: Self-pay | Admitting: Oncology

## 2013-06-29 LAB — COMPREHENSIVE METABOLIC PANEL
ALK PHOS: 148 U/L — AB
ANION GAP: 8 (ref 7–16)
Albumin: 2.9 g/dL — ABNORMAL LOW (ref 3.4–5.0)
BUN: 13 mg/dL (ref 7–18)
Bilirubin,Total: 0.4 mg/dL (ref 0.2–1.0)
CALCIUM: 9.4 mg/dL (ref 8.5–10.1)
Chloride: 96 mmol/L — ABNORMAL LOW (ref 98–107)
Co2: 30 mmol/L (ref 21–32)
Creatinine: 1 mg/dL (ref 0.60–1.30)
EGFR (African American): >60
EGFR (Non-African Amer.): >60
GLUCOSE: 130 mg/dL — AB (ref 65–99)
Osmolality: 270 (ref 275–301)
POTASSIUM: 4 mmol/L (ref 3.5–5.1)
SGOT(AST): 14 U/L — ABNORMAL LOW (ref 15–37)
SGPT (ALT): 15 U/L (ref 12–78)
Sodium: 134 mmol/L — ABNORMAL LOW (ref 136–145)
Total Protein: 7.2 g/dL (ref 6.4–8.2)

## 2013-06-29 LAB — CBC CANCER CENTER
Basophil #: 0 x10 3/mm (ref 0.0–0.1)
Basophil %: 0.5 %
EOS ABS: 0 x10 3/mm (ref 0.0–0.7)
Eosinophil %: 0.7 %
HCT: 35.1 % — AB (ref 40.0–52.0)
HGB: 11.7 g/dL — ABNORMAL LOW (ref 13.0–18.0)
Lymphocyte #: 0.3 x10 3/mm — ABNORMAL LOW (ref 1.0–3.6)
Lymphocyte %: 3.9 %
MCH: 33.6 pg (ref 26.0–34.0)
MCHC: 33.3 g/dL (ref 32.0–36.0)
MCV: 101 fL — ABNORMAL HIGH (ref 80–100)
Monocyte #: 0.3 x10 3/mm (ref 0.2–1.0)
Monocyte %: 4 %
NEUTROS ABS: 5.9 x10 3/mm (ref 1.4–6.5)
Neutrophil %: 90.9 %
Platelet: 359 x10 3/mm (ref 150–440)
RBC: 3.48 10*6/uL — ABNORMAL LOW (ref 4.40–5.90)
RDW: 14.5 % (ref 11.5–14.5)
WBC: 6.5 x10 3/mm (ref 3.8–10.6)

## 2013-07-06 LAB — COMPREHENSIVE METABOLIC PANEL
ALK PHOS: 141 U/L — AB
ALT: 27 U/L (ref 12–78)
Albumin: 2.9 g/dL — ABNORMAL LOW (ref 3.4–5.0)
Anion Gap: 4 — ABNORMAL LOW (ref 7–16)
BILIRUBIN TOTAL: 0.2 mg/dL (ref 0.2–1.0)
BUN: 13 mg/dL (ref 7–18)
CALCIUM: 8.2 mg/dL — AB (ref 8.5–10.1)
CHLORIDE: 98 mmol/L (ref 98–107)
Co2: 32 mmol/L (ref 21–32)
Creatinine: 0.99 mg/dL (ref 0.60–1.30)
EGFR (African American): >60
EGFR (Non-African Amer.): >60
Glucose: 101 mg/dL — ABNORMAL HIGH (ref 65–99)
OSMOLALITY: 268 (ref 275–301)
POTASSIUM: 3.7 mmol/L (ref 3.5–5.1)
SGOT(AST): 18 U/L (ref 15–37)
Sodium: 134 mmol/L — ABNORMAL LOW (ref 136–145)
Total Protein: 6.9 g/dL (ref 6.4–8.2)

## 2013-07-06 LAB — MAGNESIUM: Magnesium: 2.2 mg/dL

## 2013-07-06 LAB — CBC CANCER CENTER
Basophil #: 0 x10 3/mm (ref 0.0–0.1)
Basophil %: 0.8 %
EOS ABS: 0 x10 3/mm (ref 0.0–0.7)
EOS PCT: 0.8 %
HCT: 33.6 % — ABNORMAL LOW (ref 40.0–52.0)
HGB: 11.1 g/dL — AB (ref 13.0–18.0)
Lymphocyte #: 0.2 x10 3/mm — ABNORMAL LOW (ref 1.0–3.6)
Lymphocyte %: 4.5 %
MCH: 33 pg (ref 26.0–34.0)
MCHC: 32.9 g/dL (ref 32.0–36.0)
MCV: 100 fL (ref 80–100)
MONO ABS: 0.3 x10 3/mm (ref 0.2–1.0)
MONOS PCT: 6.6 %
NEUTROS ABS: 3.9 x10 3/mm (ref 1.4–6.5)
Neutrophil %: 87.3 %
Platelet: 438 x10 3/mm (ref 150–440)
RBC: 3.35 10*6/uL — ABNORMAL LOW (ref 4.40–5.90)
RDW: 14.8 % — ABNORMAL HIGH (ref 11.5–14.5)
WBC: 4.4 x10 3/mm (ref 3.8–10.6)

## 2013-07-12 ENCOUNTER — Ambulatory Visit: Payer: Self-pay | Admitting: Oncology

## 2013-07-13 LAB — COMPREHENSIVE METABOLIC PANEL
ALT: 23 U/L (ref 12–78)
ANION GAP: 8 (ref 7–16)
Albumin: 3 g/dL — ABNORMAL LOW (ref 3.4–5.0)
Alkaline Phosphatase: 140 U/L — ABNORMAL HIGH
BUN: 10 mg/dL (ref 7–18)
Bilirubin,Total: 0.2 mg/dL (ref 0.2–1.0)
CO2: 28 mmol/L (ref 21–32)
Calcium, Total: 8.1 mg/dL — ABNORMAL LOW (ref 8.5–10.1)
Chloride: 101 mmol/L (ref 98–107)
Creatinine: 0.86 mg/dL (ref 0.60–1.30)
EGFR (Non-African Amer.): >60
GLUCOSE: 123 mg/dL — AB (ref 65–99)
OSMOLALITY: 274 (ref 275–301)
Potassium: 3.6 mmol/L (ref 3.5–5.1)
SGOT(AST): 16 U/L (ref 15–37)
Sodium: 137 mmol/L (ref 136–145)
Total Protein: 6.7 g/dL (ref 6.4–8.2)

## 2013-07-13 LAB — CBC CANCER CENTER
BASOS PCT: 0.4 %
Basophil #: 0 x10 3/mm (ref 0.0–0.1)
EOS ABS: 0 x10 3/mm (ref 0.0–0.7)
EOS PCT: 1.3 %
HCT: 34 % — ABNORMAL LOW (ref 40.0–52.0)
HGB: 11.2 g/dL — AB (ref 13.0–18.0)
Lymphocyte #: 0.2 x10 3/mm — ABNORMAL LOW (ref 1.0–3.6)
Lymphocyte %: 5.4 %
MCH: 32.6 pg (ref 26.0–34.0)
MCHC: 32.9 g/dL (ref 32.0–36.0)
MCV: 99 fL (ref 80–100)
Monocyte #: 0.3 x10 3/mm (ref 0.2–1.0)
Monocyte %: 6.9 %
NEUTROS ABS: 3.2 x10 3/mm (ref 1.4–6.5)
Neutrophil %: 86 %
Platelet: 345 x10 3/mm (ref 150–440)
RBC: 3.43 10*6/uL — ABNORMAL LOW (ref 4.40–5.90)
RDW: 14.9 % — ABNORMAL HIGH (ref 11.5–14.5)
WBC: 3.8 x10 3/mm (ref 3.8–10.6)

## 2013-07-13 LAB — MAGNESIUM: MAGNESIUM: 2.1 mg/dL

## 2013-07-20 LAB — CBC CANCER CENTER
Basophil #: 0 x10 3/mm (ref 0.0–0.1)
Basophil %: 0.6 %
EOS PCT: 1.7 %
Eosinophil #: 0.1 x10 3/mm (ref 0.0–0.7)
HCT: 37.3 % — ABNORMAL LOW (ref 40.0–52.0)
HGB: 12.1 g/dL — ABNORMAL LOW (ref 13.0–18.0)
LYMPHS PCT: 5.9 %
Lymphocyte #: 0.2 x10 3/mm — ABNORMAL LOW (ref 1.0–3.6)
MCH: 32.1 pg (ref 26.0–34.0)
MCHC: 32.5 g/dL (ref 32.0–36.0)
MCV: 99 fL (ref 80–100)
MONO ABS: 0.2 x10 3/mm (ref 0.2–1.0)
Monocyte %: 6.6 %
NEUTROS PCT: 85.2 %
Neutrophil #: 3.1 x10 3/mm (ref 1.4–6.5)
Platelet: 274 x10 3/mm (ref 150–440)
RBC: 3.78 10*6/uL — AB (ref 4.40–5.90)
RDW: 15 % — ABNORMAL HIGH (ref 11.5–14.5)
WBC: 3.7 x10 3/mm — ABNORMAL LOW (ref 3.8–10.6)

## 2013-07-27 LAB — COMPREHENSIVE METABOLIC PANEL
ANION GAP: 5 — AB (ref 7–16)
AST: 16 U/L (ref 15–37)
Albumin: 3.1 g/dL — ABNORMAL LOW (ref 3.4–5.0)
Alkaline Phosphatase: 156 U/L — ABNORMAL HIGH
BUN: 10 mg/dL (ref 7–18)
Bilirubin,Total: 0.2 mg/dL (ref 0.2–1.0)
CALCIUM: 9.1 mg/dL (ref 8.5–10.1)
CHLORIDE: 103 mmol/L (ref 98–107)
CO2: 32 mmol/L (ref 21–32)
CREATININE: 0.88 mg/dL (ref 0.60–1.30)
EGFR (Non-African Amer.): >60
Glucose: 115 mg/dL — ABNORMAL HIGH (ref 65–99)
OSMOLALITY: 279 (ref 275–301)
POTASSIUM: 3.6 mmol/L (ref 3.5–5.1)
SGPT (ALT): 19 U/L (ref 12–78)
Sodium: 140 mmol/L (ref 136–145)
Total Protein: 7 g/dL (ref 6.4–8.2)

## 2013-07-27 LAB — MAGNESIUM: Magnesium: 1.8 mg/dL

## 2013-07-27 LAB — CBC CANCER CENTER
Basophil #: 0 x10 3/mm (ref 0.0–0.1)
Basophil %: 0.6 %
Eosinophil #: 0.1 x10 3/mm (ref 0.0–0.7)
Eosinophil %: 2 %
HCT: 38.7 % — AB (ref 40.0–52.0)
HGB: 13 g/dL (ref 13.0–18.0)
Lymphocyte #: 0.2 x10 3/mm — ABNORMAL LOW (ref 1.0–3.6)
Lymphocyte %: 6.1 %
MCH: 33.1 pg (ref 26.0–34.0)
MCHC: 33.5 g/dL (ref 32.0–36.0)
MCV: 99 fL (ref 80–100)
MONO ABS: 0.2 x10 3/mm (ref 0.2–1.0)
MONOS PCT: 6 %
Neutrophil #: 2.7 x10 3/mm (ref 1.4–6.5)
Neutrophil %: 85.3 %
Platelet: 236 x10 3/mm (ref 150–440)
RBC: 3.92 10*6/uL — ABNORMAL LOW (ref 4.40–5.90)
RDW: 14.4 % (ref 11.5–14.5)
WBC: 3.1 x10 3/mm — ABNORMAL LOW (ref 3.8–10.6)

## 2013-08-03 LAB — CBC CANCER CENTER
Basophil #: 0 x10 3/mm (ref 0.0–0.1)
Basophil %: 0.6 %
Eosinophil #: 0 x10 3/mm (ref 0.0–0.7)
Eosinophil %: 1.3 %
HCT: 38.2 % — AB (ref 40.0–52.0)
HGB: 12.4 g/dL — AB (ref 13.0–18.0)
LYMPHS ABS: 0.2 x10 3/mm — AB (ref 1.0–3.6)
Lymphocyte %: 4.2 %
MCH: 31.8 pg (ref 26.0–34.0)
MCHC: 32.4 g/dL (ref 32.0–36.0)
MCV: 98 fL (ref 80–100)
MONOS PCT: 6.5 %
Monocyte #: 0.2 x10 3/mm (ref 0.2–1.0)
NEUTROS ABS: 3.4 x10 3/mm (ref 1.4–6.5)
Neutrophil %: 87.4 %
Platelet: 263 x10 3/mm (ref 150–440)
RBC: 3.89 10*6/uL — AB (ref 4.40–5.90)
RDW: 15 % — AB (ref 11.5–14.5)
WBC: 3.8 x10 3/mm (ref 3.8–10.6)

## 2013-08-03 LAB — MAGNESIUM: MAGNESIUM: 1.7 mg/dL — AB

## 2013-08-10 LAB — CBC CANCER CENTER
Basophil #: 0 x10 3/mm (ref 0.0–0.1)
Basophil %: 0.6 %
EOS PCT: 1.6 %
Eosinophil #: 0.1 x10 3/mm (ref 0.0–0.7)
HCT: 38.5 % — ABNORMAL LOW (ref 40.0–52.0)
HGB: 12.5 g/dL — AB (ref 13.0–18.0)
Lymphocyte #: 0.1 x10 3/mm — ABNORMAL LOW (ref 1.0–3.6)
Lymphocyte %: 3.8 %
MCH: 31.6 pg (ref 26.0–34.0)
MCHC: 32.6 g/dL (ref 32.0–36.0)
MCV: 97 fL (ref 80–100)
Monocyte #: 0.3 x10 3/mm (ref 0.2–1.0)
Monocyte %: 8.6 %
NEUTROS PCT: 85.4 %
Neutrophil #: 3.3 x10 3/mm (ref 1.4–6.5)
PLATELETS: 275 x10 3/mm (ref 150–440)
RBC: 3.97 10*6/uL — AB (ref 4.40–5.90)
RDW: 14.5 % (ref 11.5–14.5)
WBC: 3.8 x10 3/mm (ref 3.8–10.6)

## 2013-08-10 LAB — COMPREHENSIVE METABOLIC PANEL
ALK PHOS: 139 U/L — AB
ALT: 16 U/L (ref 12–78)
AST: 17 U/L (ref 15–37)
Albumin: 2.9 g/dL — ABNORMAL LOW (ref 3.4–5.0)
Anion Gap: 9 (ref 7–16)
BUN: 8 mg/dL (ref 7–18)
Bilirubin,Total: 0.3 mg/dL (ref 0.2–1.0)
CO2: 29 mmol/L (ref 21–32)
CREATININE: 0.91 mg/dL (ref 0.60–1.30)
Calcium, Total: 8.8 mg/dL (ref 8.5–10.1)
Chloride: 101 mmol/L (ref 98–107)
EGFR (African American): >60
GLUCOSE: 110 mg/dL — AB (ref 65–99)
Osmolality: 277 (ref 275–301)
POTASSIUM: 3.2 mmol/L — AB (ref 3.5–5.1)
Sodium: 139 mmol/L (ref 136–145)
TOTAL PROTEIN: 6.8 g/dL (ref 6.4–8.2)

## 2013-08-10 LAB — MAGNESIUM: Magnesium: 1.8 mg/dL

## 2013-08-12 ENCOUNTER — Ambulatory Visit: Payer: Self-pay | Admitting: Oncology

## 2013-08-24 LAB — COMPREHENSIVE METABOLIC PANEL
ALBUMIN: 3 g/dL — AB (ref 3.4–5.0)
Alkaline Phosphatase: 139 U/L — ABNORMAL HIGH
Anion Gap: 6 — ABNORMAL LOW (ref 7–16)
BUN: 9 mg/dL (ref 7–18)
Bilirubin,Total: 0.3 mg/dL (ref 0.2–1.0)
Calcium, Total: 8.9 mg/dL (ref 8.5–10.1)
Chloride: 101 mmol/L (ref 98–107)
Co2: 32 mmol/L (ref 21–32)
Creatinine: 0.84 mg/dL (ref 0.60–1.30)
EGFR (African American): >60
EGFR (Non-African Amer.): >60
Glucose: 128 mg/dL — ABNORMAL HIGH (ref 65–99)
OSMOLALITY: 278 (ref 275–301)
POTASSIUM: 3.2 mmol/L — AB (ref 3.5–5.1)
SGOT(AST): 17 U/L (ref 15–37)
SGPT (ALT): 19 U/L (ref 12–78)
Sodium: 139 mmol/L (ref 136–145)
TOTAL PROTEIN: 6.9 g/dL (ref 6.4–8.2)

## 2013-08-24 LAB — CBC CANCER CENTER
BASOS ABS: 0 x10 3/mm (ref 0.0–0.1)
Basophil %: 0.7 %
EOS PCT: 2.5 %
Eosinophil #: 0.1 x10 3/mm (ref 0.0–0.7)
HCT: 39.5 % — ABNORMAL LOW (ref 40.0–52.0)
HGB: 12.9 g/dL — ABNORMAL LOW (ref 13.0–18.0)
LYMPHS PCT: 4.9 %
Lymphocyte #: 0.2 x10 3/mm — ABNORMAL LOW (ref 1.0–3.6)
MCH: 31.8 pg (ref 26.0–34.0)
MCHC: 32.7 g/dL (ref 32.0–36.0)
MCV: 97 fL (ref 80–100)
MONO ABS: 0.3 x10 3/mm (ref 0.2–1.0)
Monocyte %: 8.8 %
NEUTROS PCT: 83.1 %
Neutrophil #: 2.7 x10 3/mm (ref 1.4–6.5)
PLATELETS: 269 x10 3/mm (ref 150–440)
RBC: 4.07 10*6/uL — ABNORMAL LOW (ref 4.40–5.90)
RDW: 15 % — ABNORMAL HIGH (ref 11.5–14.5)
WBC: 3.2 x10 3/mm — ABNORMAL LOW (ref 3.8–10.6)

## 2013-08-24 LAB — MAGNESIUM: Magnesium: 1.8 mg/dL

## 2013-08-31 LAB — CBC CANCER CENTER
BASOS ABS: 0 x10 3/mm (ref 0.0–0.1)
BASOS PCT: 1 %
EOS PCT: 2.8 %
Eosinophil #: 0.1 x10 3/mm (ref 0.0–0.7)
HCT: 37.9 % — ABNORMAL LOW (ref 40.0–52.0)
HGB: 12.4 g/dL — AB (ref 13.0–18.0)
LYMPHS ABS: 0.2 x10 3/mm — AB (ref 1.0–3.6)
Lymphocyte %: 6.3 %
MCH: 31.3 pg (ref 26.0–34.0)
MCHC: 32.8 g/dL (ref 32.0–36.0)
MCV: 96 fL (ref 80–100)
MONOS PCT: 13.2 %
Monocyte #: 0.4 x10 3/mm (ref 0.2–1.0)
NEUTROS ABS: 2.1 x10 3/mm (ref 1.4–6.5)
Neutrophil %: 76.7 %
Platelet: 247 x10 3/mm (ref 150–440)
RBC: 3.97 10*6/uL — ABNORMAL LOW (ref 4.40–5.90)
RDW: 14.6 % — ABNORMAL HIGH (ref 11.5–14.5)
WBC: 2.7 x10 3/mm — ABNORMAL LOW (ref 3.8–10.6)

## 2013-09-03 ENCOUNTER — Ambulatory Visit: Payer: Self-pay | Admitting: Oncology

## 2013-09-07 LAB — CBC CANCER CENTER
BASOS PCT: 0.9 %
Basophil #: 0 x10 3/mm (ref 0.0–0.1)
EOS ABS: 0.1 x10 3/mm (ref 0.0–0.7)
EOS PCT: 2.2 %
HCT: 39.5 % — ABNORMAL LOW (ref 40.0–52.0)
HGB: 13.2 g/dL (ref 13.0–18.0)
Lymphocyte #: 0.1 x10 3/mm — ABNORMAL LOW (ref 1.0–3.6)
Lymphocyte %: 3.8 %
MCH: 31.6 pg (ref 26.0–34.0)
MCHC: 33.4 g/dL (ref 32.0–36.0)
MCV: 95 fL (ref 80–100)
MONO ABS: 0.3 x10 3/mm (ref 0.2–1.0)
Monocyte %: 8.3 %
Neutrophil #: 2.9 x10 3/mm (ref 1.4–6.5)
Neutrophil %: 84.8 %
Platelet: 251 x10 3/mm (ref 150–440)
RBC: 4.17 10*6/uL — ABNORMAL LOW (ref 4.40–5.90)
RDW: 14.8 % — ABNORMAL HIGH (ref 11.5–14.5)
WBC: 3.4 x10 3/mm — AB (ref 3.8–10.6)

## 2013-09-07 LAB — COMPREHENSIVE METABOLIC PANEL
ALBUMIN: 3 g/dL — AB (ref 3.4–5.0)
ANION GAP: 8 (ref 7–16)
Alkaline Phosphatase: 142 U/L — ABNORMAL HIGH
BUN: 7 mg/dL (ref 7–18)
Bilirubin,Total: 0.3 mg/dL (ref 0.2–1.0)
CHLORIDE: 102 mmol/L (ref 98–107)
Calcium, Total: 9.1 mg/dL (ref 8.5–10.1)
Co2: 31 mmol/L (ref 21–32)
Creatinine: 0.8 mg/dL (ref 0.60–1.30)
EGFR (African American): >60
GLUCOSE: 80 mg/dL (ref 65–99)
Osmolality: 278 (ref 275–301)
POTASSIUM: 3.6 mmol/L (ref 3.5–5.1)
SGOT(AST): 14 U/L — ABNORMAL LOW (ref 15–37)
SGPT (ALT): 22 U/L (ref 12–78)
Sodium: 141 mmol/L (ref 136–145)
TOTAL PROTEIN: 6.9 g/dL (ref 6.4–8.2)

## 2013-09-07 LAB — MAGNESIUM: Magnesium: 1.9 mg/dL

## 2013-09-11 ENCOUNTER — Ambulatory Visit: Payer: Self-pay | Admitting: Oncology

## 2013-09-15 ENCOUNTER — Ambulatory Visit: Payer: Self-pay | Admitting: Oncology

## 2013-09-16 LAB — COMPREHENSIVE METABOLIC PANEL
ALK PHOS: 159 U/L — AB
ANION GAP: 7 (ref 7–16)
Albumin: 3 g/dL — ABNORMAL LOW (ref 3.4–5.0)
BUN: 7 mg/dL (ref 7–18)
Bilirubin,Total: 0.5 mg/dL (ref 0.2–1.0)
CALCIUM: 8.9 mg/dL (ref 8.5–10.1)
CREATININE: 0.88 mg/dL (ref 0.60–1.30)
Chloride: 101 mmol/L (ref 98–107)
Co2: 31 mmol/L (ref 21–32)
GLUCOSE: 117 mg/dL — AB (ref 65–99)
Osmolality: 277 (ref 275–301)
POTASSIUM: 3.9 mmol/L (ref 3.5–5.1)
SGOT(AST): 16 U/L (ref 15–37)
SGPT (ALT): 22 U/L (ref 12–78)
Sodium: 139 mmol/L (ref 136–145)
TOTAL PROTEIN: 7.1 g/dL (ref 6.4–8.2)

## 2013-09-16 LAB — CBC CANCER CENTER
BASOS ABS: 0 x10 3/mm (ref 0.0–0.1)
Basophil %: 1.1 %
EOS PCT: 2.9 %
Eosinophil #: 0.1 x10 3/mm (ref 0.0–0.7)
HCT: 40.3 % (ref 40.0–52.0)
HGB: 13.5 g/dL (ref 13.0–18.0)
Lymphocyte #: 0.2 x10 3/mm — ABNORMAL LOW (ref 1.0–3.6)
Lymphocyte %: 5 %
MCH: 31.8 pg (ref 26.0–34.0)
MCHC: 33.4 g/dL (ref 32.0–36.0)
MCV: 95 fL (ref 80–100)
Monocyte #: 0.3 x10 3/mm (ref 0.2–1.0)
Monocyte %: 8.1 %
NEUTROS PCT: 82.9 %
Neutrophil #: 2.9 x10 3/mm (ref 1.4–6.5)
PLATELETS: 276 x10 3/mm (ref 150–440)
RBC: 4.23 10*6/uL — ABNORMAL LOW (ref 4.40–5.90)
RDW: 14.4 % (ref 11.5–14.5)
WBC: 3.5 x10 3/mm — AB (ref 3.8–10.6)

## 2013-09-23 LAB — CBC CANCER CENTER
BASOS ABS: 0 x10 3/mm (ref 0.0–0.1)
BASOS PCT: 0.9 %
EOS ABS: 0.1 x10 3/mm (ref 0.0–0.7)
Eosinophil %: 2.9 %
HCT: 34.9 % — AB (ref 40.0–52.0)
HGB: 11.8 g/dL — ABNORMAL LOW (ref 13.0–18.0)
Lymphocyte #: 0.2 x10 3/mm — ABNORMAL LOW (ref 1.0–3.6)
Lymphocyte %: 6.5 %
MCH: 31.5 pg (ref 26.0–34.0)
MCHC: 33.8 g/dL (ref 32.0–36.0)
MCV: 93 fL (ref 80–100)
Monocyte #: 0.1 x10 3/mm — ABNORMAL LOW (ref 0.2–1.0)
Monocyte %: 6 %
NEUTROS ABS: 2 x10 3/mm (ref 1.4–6.5)
Neutrophil %: 83.7 %
Platelet: 258 x10 3/mm (ref 150–440)
RBC: 3.75 10*6/uL — ABNORMAL LOW (ref 4.40–5.90)
RDW: 14.2 % (ref 11.5–14.5)
WBC: 2.4 x10 3/mm — ABNORMAL LOW (ref 3.8–10.6)

## 2013-09-30 LAB — CBC CANCER CENTER
BASOS ABS: 0 x10 3/mm (ref 0.0–0.1)
Basophil %: 2.2 %
EOS ABS: 0 x10 3/mm (ref 0.0–0.7)
Eosinophil %: 1.8 %
HCT: 33.9 % — ABNORMAL LOW (ref 40.0–52.0)
HGB: 11.5 g/dL — AB (ref 13.0–18.0)
LYMPHS ABS: 0.1 x10 3/mm — AB (ref 1.0–3.6)
Lymphocyte %: 11.7 %
MCH: 31.5 pg (ref 26.0–34.0)
MCHC: 34 g/dL (ref 32.0–36.0)
MCV: 92 fL (ref 80–100)
MONO ABS: 0.2 x10 3/mm (ref 0.2–1.0)
MONOS PCT: 16 %
NEUTROS ABS: 0.8 x10 3/mm — AB (ref 1.4–6.5)
Neutrophil %: 68.3 %
PLATELETS: 229 x10 3/mm (ref 150–440)
RBC: 3.67 10*6/uL — AB (ref 4.40–5.90)
RDW: 14.2 % (ref 11.5–14.5)
WBC: 1.2 x10 3/mm — CL (ref 3.8–10.6)

## 2013-10-07 LAB — COMPREHENSIVE METABOLIC PANEL
ALK PHOS: 171 U/L — AB
Albumin: 3 g/dL — ABNORMAL LOW (ref 3.4–5.0)
Anion Gap: 5 — ABNORMAL LOW (ref 7–16)
BILIRUBIN TOTAL: 0.4 mg/dL (ref 0.2–1.0)
BUN: 6 mg/dL — ABNORMAL LOW (ref 7–18)
CALCIUM: 9.5 mg/dL (ref 8.5–10.1)
CO2: 30 mmol/L (ref 21–32)
Chloride: 100 mmol/L (ref 98–107)
Creatinine: 0.88 mg/dL (ref 0.60–1.30)
EGFR (Non-African Amer.): >60
GLUCOSE: 149 mg/dL — AB (ref 65–99)
Osmolality: 271 (ref 275–301)
Potassium: 4.3 mmol/L (ref 3.5–5.1)
SGOT(AST): 14 U/L — ABNORMAL LOW (ref 15–37)
SGPT (ALT): 17 U/L (ref 12–78)
Sodium: 135 mmol/L — ABNORMAL LOW (ref 136–145)
Total Protein: 7.1 g/dL (ref 6.4–8.2)

## 2013-10-07 LAB — CBC CANCER CENTER
BASOS PCT: 0.6 %
Basophil #: 0 x10 3/mm (ref 0.0–0.1)
EOS ABS: 0 x10 3/mm (ref 0.0–0.7)
EOS PCT: 0.4 %
HCT: 39.2 % — AB (ref 40.0–52.0)
HGB: 12.9 g/dL — ABNORMAL LOW (ref 13.0–18.0)
Lymphocyte #: 0.2 x10 3/mm — ABNORMAL LOW (ref 1.0–3.6)
Lymphocyte %: 6 %
MCH: 31.6 pg (ref 26.0–34.0)
MCHC: 32.8 g/dL (ref 32.0–36.0)
MCV: 96 fL (ref 80–100)
Monocyte #: 0.3 x10 3/mm (ref 0.2–1.0)
Monocyte %: 9.7 %
NEUTROS ABS: 2.5 x10 3/mm (ref 1.4–6.5)
Neutrophil %: 83.3 %
Platelet: 214 x10 3/mm (ref 150–440)
RBC: 4.07 10*6/uL — AB (ref 4.40–5.90)
RDW: 14.7 % — ABNORMAL HIGH (ref 11.5–14.5)
WBC: 3.1 x10 3/mm — ABNORMAL LOW (ref 3.8–10.6)

## 2013-10-07 LAB — MAGNESIUM: MAGNESIUM: 2.2 mg/dL

## 2013-10-10 ENCOUNTER — Emergency Department: Payer: Self-pay | Admitting: Emergency Medicine

## 2013-10-10 LAB — TROPONIN I: Troponin-I: <0.02 ng/mL

## 2013-10-10 LAB — CBC
HCT: 39.9 % — ABNORMAL LOW (ref 40.0–52.0)
HGB: 13 g/dL (ref 13.0–18.0)
MCH: 31 pg (ref 26.0–34.0)
MCHC: 32.5 g/dL (ref 32.0–36.0)
MCV: 95 fL (ref 80–100)
PLATELETS: 200 10*3/uL (ref 150–440)
RBC: 4.19 10*6/uL — ABNORMAL LOW (ref 4.40–5.90)
RDW: 15.1 % — ABNORMAL HIGH (ref 11.5–14.5)
WBC: 3.8 10*3/uL (ref 3.8–10.6)

## 2013-10-10 LAB — COMPREHENSIVE METABOLIC PANEL
Albumin: 3 g/dL — ABNORMAL LOW (ref 3.4–5.0)
Alkaline Phosphatase: 155 U/L — ABNORMAL HIGH
Anion Gap: 5 — ABNORMAL LOW (ref 7–16)
BUN: 10 mg/dL (ref 7–18)
Bilirubin,Total: 0.6 mg/dL (ref 0.2–1.0)
CO2: 29 mmol/L (ref 21–32)
CREATININE: 0.92 mg/dL (ref 0.60–1.30)
Calcium, Total: 9.2 mg/dL (ref 8.5–10.1)
Chloride: 100 mmol/L (ref 98–107)
EGFR (Non-African Amer.): >60
Glucose: 117 mg/dL — ABNORMAL HIGH (ref 65–99)
Osmolality: 268 (ref 275–301)
Potassium: 4.2 mmol/L (ref 3.5–5.1)
SGOT(AST): 22 U/L (ref 15–37)
SGPT (ALT): 15 U/L (ref 12–78)
Sodium: 134 mmol/L — ABNORMAL LOW (ref 136–145)
TOTAL PROTEIN: 7.2 g/dL (ref 6.4–8.2)

## 2013-10-10 LAB — URINALYSIS, COMPLETE
BILIRUBIN, UR: NEGATIVE
Bacteria: NONE SEEN
Glucose,UR: NEGATIVE mg/dL (ref 0–75)
KETONE: NEGATIVE
Leukocyte Esterase: NEGATIVE
NITRITE: NEGATIVE
PH: 6 (ref 4.5–8.0)
RBC,UR: 7 /HPF (ref 0–5)
SQUAMOUS EPITHELIAL: NONE SEEN
Specific Gravity: 1.054 (ref 1.003–1.030)
WBC UR: 2 /HPF (ref 0–5)

## 2013-10-10 LAB — CK TOTAL AND CKMB (NOT AT ARMC): CK, TOTAL: 15 U/L — AB

## 2013-10-12 ENCOUNTER — Ambulatory Visit: Payer: Self-pay | Admitting: Oncology

## 2013-10-13 LAB — CBC CANCER CENTER
Basophil #: 0 x10 3/mm (ref 0.0–0.1)
Basophil %: 0.6 %
EOS ABS: 0 x10 3/mm (ref 0.0–0.7)
EOS PCT: 0.7 %
HCT: 37.9 % — ABNORMAL LOW (ref 40.0–52.0)
HGB: 12.7 g/dL — ABNORMAL LOW (ref 13.0–18.0)
LYMPHS ABS: 0.1 x10 3/mm — AB (ref 1.0–3.6)
Lymphocyte %: 4.9 %
MCH: 31.7 pg (ref 26.0–34.0)
MCHC: 33.6 g/dL (ref 32.0–36.0)
MCV: 94 fL (ref 80–100)
Monocyte #: 0.1 x10 3/mm — ABNORMAL LOW (ref 0.2–1.0)
Monocyte %: 2.8 %
Neutrophil #: 2.6 x10 3/mm (ref 1.4–6.5)
Neutrophil %: 91 %
PLATELETS: 203 x10 3/mm (ref 150–440)
RBC: 4.02 10*6/uL — ABNORMAL LOW (ref 4.40–5.90)
RDW: 14.8 % — AB (ref 11.5–14.5)
WBC: 2.9 x10 3/mm — ABNORMAL LOW (ref 3.8–10.6)

## 2013-10-13 LAB — COMPREHENSIVE METABOLIC PANEL
ALBUMIN: 2.9 g/dL — AB (ref 3.4–5.0)
ALK PHOS: 154 U/L — AB
Anion Gap: 7 (ref 7–16)
BILIRUBIN TOTAL: 0.4 mg/dL (ref 0.2–1.0)
BUN: 9 mg/dL (ref 7–18)
Calcium, Total: 9.6 mg/dL (ref 8.5–10.1)
Chloride: 97 mmol/L — ABNORMAL LOW (ref 98–107)
Co2: 31 mmol/L (ref 21–32)
Creatinine: 0.91 mg/dL (ref 0.60–1.30)
Glucose: 159 mg/dL — ABNORMAL HIGH (ref 65–99)
OSMOLALITY: 272 (ref 275–301)
Potassium: 4.5 mmol/L (ref 3.5–5.1)
SGOT(AST): 10 U/L — ABNORMAL LOW (ref 15–37)
SGPT (ALT): 18 U/L (ref 12–78)
SODIUM: 135 mmol/L — AB (ref 136–145)
Total Protein: 7.1 g/dL (ref 6.4–8.2)

## 2013-10-14 LAB — CBC CANCER CENTER
BASOS ABS: 0 x10 3/mm (ref 0.0–0.1)
Basophil %: 0.4 %
Eosinophil #: 0 x10 3/mm (ref 0.0–0.7)
Eosinophil %: 0.6 %
HCT: 34.1 % — AB (ref 40.0–52.0)
HGB: 11.6 g/dL — ABNORMAL LOW (ref 13.0–18.0)
LYMPHS ABS: 0.2 x10 3/mm — AB (ref 1.0–3.6)
LYMPHS PCT: 5.3 %
MCH: 31.7 pg (ref 26.0–34.0)
MCHC: 34 g/dL (ref 32.0–36.0)
MCV: 94 fL (ref 80–100)
Monocyte #: 0.2 x10 3/mm (ref 0.2–1.0)
Monocyte %: 4.8 %
Neutrophil #: 3.4 x10 3/mm (ref 1.4–6.5)
Neutrophil %: 88.9 %
Platelet: 216 x10 3/mm (ref 150–440)
RBC: 3.65 10*6/uL — AB (ref 4.40–5.90)
RDW: 15.1 % — AB (ref 11.5–14.5)
WBC: 3.8 x10 3/mm (ref 3.8–10.6)

## 2013-10-14 LAB — COMPREHENSIVE METABOLIC PANEL
ALK PHOS: 142 U/L — AB
ANION GAP: 8 (ref 7–16)
Albumin: 2.9 g/dL — ABNORMAL LOW (ref 3.4–5.0)
BUN: 14 mg/dL (ref 7–18)
Bilirubin,Total: 0.2 mg/dL (ref 0.2–1.0)
CHLORIDE: 102 mmol/L (ref 98–107)
Calcium, Total: 9.3 mg/dL (ref 8.5–10.1)
Co2: 29 mmol/L (ref 21–32)
Creatinine: 0.81 mg/dL (ref 0.60–1.30)
EGFR (African American): >60
EGFR (Non-African Amer.): >60
GLUCOSE: 108 mg/dL — AB (ref 65–99)
Osmolality: 279 (ref 275–301)
Potassium: 3.2 mmol/L — ABNORMAL LOW (ref 3.5–5.1)
SGOT(AST): 18 U/L (ref 15–37)
SGPT (ALT): 23 U/L (ref 12–78)
Sodium: 139 mmol/L (ref 136–145)
Total Protein: 6.9 g/dL (ref 6.4–8.2)

## 2013-10-20 ENCOUNTER — Ambulatory Visit: Payer: BC Managed Care – PPO | Admitting: Internal Medicine

## 2013-10-21 LAB — CBC CANCER CENTER
Basophil #: 0 x10 3/mm (ref 0.0–0.1)
Basophil %: 0.8 %
EOS ABS: 0 x10 3/mm (ref 0.0–0.7)
EOS PCT: 1.2 %
HCT: 36.3 % — AB (ref 40.0–52.0)
HGB: 12 g/dL — ABNORMAL LOW (ref 13.0–18.0)
Lymphocyte #: 0.2 x10 3/mm — ABNORMAL LOW (ref 1.0–3.6)
Lymphocyte %: 9.7 %
MCH: 32.3 pg (ref 26.0–34.0)
MCHC: 33.1 g/dL (ref 32.0–36.0)
MCV: 98 fL (ref 80–100)
MONOS PCT: 10 %
Monocyte #: 0.2 x10 3/mm (ref 0.2–1.0)
Neutrophil #: 1.8 x10 3/mm (ref 1.4–6.5)
Neutrophil %: 78.3 %
Platelet: 214 x10 3/mm (ref 150–440)
RBC: 3.72 10*6/uL — ABNORMAL LOW (ref 4.40–5.90)
RDW: 16 % — ABNORMAL HIGH (ref 11.5–14.5)
WBC: 2.2 x10 3/mm — AB (ref 3.8–10.6)

## 2013-10-29 LAB — CBC CANCER CENTER
BASOS ABS: 0 x10 3/mm (ref 0.0–0.1)
Basophil %: 0.8 %
EOS ABS: 0 x10 3/mm (ref 0.0–0.7)
Eosinophil %: 0.6 %
HCT: 36.7 % — AB (ref 40.0–52.0)
HGB: 12.2 g/dL — AB (ref 13.0–18.0)
LYMPHS ABS: 0.1 x10 3/mm — AB (ref 1.0–3.6)
Lymphocyte %: 6.5 %
MCH: 32 pg (ref 26.0–34.0)
MCHC: 33.2 g/dL (ref 32.0–36.0)
MCV: 96 fL (ref 80–100)
MONO ABS: 0.1 x10 3/mm — AB (ref 0.2–1.0)
MONOS PCT: 3.4 %
NEUTROS PCT: 88.7 %
Neutrophil #: 1.7 x10 3/mm (ref 1.4–6.5)
Platelet: 177 x10 3/mm (ref 150–440)
RBC: 3.81 10*6/uL — ABNORMAL LOW (ref 4.40–5.90)
RDW: 15.9 % — AB (ref 11.5–14.5)
WBC: 1.9 x10 3/mm — CL (ref 3.8–10.6)

## 2013-10-29 LAB — COMPREHENSIVE METABOLIC PANEL
ALBUMIN: 3.1 g/dL — AB (ref 3.4–5.0)
ALK PHOS: 125 U/L — AB
ANION GAP: 7 (ref 7–16)
BUN: 13 mg/dL (ref 7–18)
Bilirubin,Total: 0.5 mg/dL (ref 0.2–1.0)
CHLORIDE: 101 mmol/L (ref 98–107)
Calcium, Total: 9.3 mg/dL (ref 8.5–10.1)
Co2: 31 mmol/L (ref 21–32)
Creatinine: 0.76 mg/dL (ref 0.60–1.30)
EGFR (African American): >60
EGFR (Non-African Amer.): >60
Glucose: 108 mg/dL — ABNORMAL HIGH (ref 65–99)
Osmolality: 278 (ref 275–301)
Potassium: 4.6 mmol/L (ref 3.5–5.1)
SGOT(AST): 23 U/L (ref 15–37)
SGPT (ALT): 46 U/L (ref 12–78)
Sodium: 139 mmol/L (ref 136–145)
Total Protein: 7 g/dL (ref 6.4–8.2)

## 2013-10-29 LAB — MAGNESIUM: MAGNESIUM: 1.9 mg/dL

## 2013-10-31 ENCOUNTER — Emergency Department: Payer: Self-pay | Admitting: Emergency Medicine

## 2013-11-05 LAB — CBC CANCER CENTER
BASOS ABS: 0 x10 3/mm (ref 0.0–0.1)
Basophil %: 1.1 %
EOS ABS: 0 x10 3/mm (ref 0.0–0.7)
EOS PCT: 1.8 %
HCT: 31.7 % — AB (ref 40.0–52.0)
HGB: 11 g/dL — ABNORMAL LOW (ref 13.0–18.0)
Lymphocyte #: 0.1 x10 3/mm — ABNORMAL LOW (ref 1.0–3.6)
Lymphocyte %: 14.6 %
MCH: 32.6 pg (ref 26.0–34.0)
MCHC: 34.7 g/dL (ref 32.0–36.0)
MCV: 94 fL (ref 80–100)
MONO ABS: 0.1 x10 3/mm — AB (ref 0.2–1.0)
Monocyte %: 12.9 %
NEUTROS PCT: 69.6 %
Neutrophil #: 0.5 x10 3/mm — ABNORMAL LOW (ref 1.4–6.5)
PLATELETS: 177 x10 3/mm (ref 150–440)
RBC: 3.37 10*6/uL — ABNORMAL LOW (ref 4.40–5.90)
RDW: 16.5 % — ABNORMAL HIGH (ref 11.5–14.5)
WBC: 0.7 x10 3/mm — CL (ref 3.8–10.6)

## 2013-11-05 LAB — COMPREHENSIVE METABOLIC PANEL
ANION GAP: 6 — AB (ref 7–16)
AST: 18 U/L (ref 15–37)
Albumin: 2.9 g/dL — ABNORMAL LOW (ref 3.4–5.0)
Alkaline Phosphatase: 131 U/L — ABNORMAL HIGH
BUN: 8 mg/dL (ref 7–18)
Bilirubin,Total: 0.4 mg/dL (ref 0.2–1.0)
CHLORIDE: 101 mmol/L (ref 98–107)
Calcium, Total: 8.6 mg/dL (ref 8.5–10.1)
Co2: 31 mmol/L (ref 21–32)
Creatinine: 0.84 mg/dL (ref 0.60–1.30)
EGFR (African American): >60
Glucose: 132 mg/dL — ABNORMAL HIGH (ref 65–99)
Osmolality: 276 (ref 275–301)
Potassium: 3.4 mmol/L — ABNORMAL LOW (ref 3.5–5.1)
SGPT (ALT): 32 U/L (ref 12–78)
SODIUM: 138 mmol/L (ref 136–145)
Total Protein: 6.9 g/dL (ref 6.4–8.2)

## 2013-11-05 LAB — MAGNESIUM: Magnesium: 1.6 mg/dL — ABNORMAL LOW

## 2013-11-11 ENCOUNTER — Ambulatory Visit: Payer: Self-pay | Admitting: Oncology

## 2013-11-12 LAB — CBC CANCER CENTER
Basophil #: 0 x10 3/mm (ref 0.0–0.1)
Basophil %: 0.6 %
Eosinophil #: 0 x10 3/mm (ref 0.0–0.7)
Eosinophil %: 1 %
HCT: 32.8 % — ABNORMAL LOW (ref 40.0–52.0)
HGB: 11.1 g/dL — ABNORMAL LOW (ref 13.0–18.0)
LYMPHS ABS: 0.2 x10 3/mm — AB (ref 1.0–3.6)
LYMPHS PCT: 9.6 %
MCH: 33.2 pg (ref 26.0–34.0)
MCHC: 33.8 g/dL (ref 32.0–36.0)
MCV: 98 fL (ref 80–100)
MONO ABS: 0.3 x10 3/mm (ref 0.2–1.0)
Monocyte %: 15.3 %
NEUTROS ABS: 1.3 x10 3/mm — AB (ref 1.4–6.5)
NEUTROS PCT: 73.5 %
PLATELETS: 239 x10 3/mm (ref 150–440)
RBC: 3.34 10*6/uL — ABNORMAL LOW (ref 4.40–5.90)
RDW: 18.2 % — ABNORMAL HIGH (ref 11.5–14.5)
WBC: 1.7 x10 3/mm — CL (ref 3.8–10.6)

## 2013-11-19 LAB — COMPREHENSIVE METABOLIC PANEL
ALBUMIN: 2.9 g/dL — AB (ref 3.4–5.0)
Alkaline Phosphatase: 150 U/L — ABNORMAL HIGH
Anion Gap: 6 — ABNORMAL LOW (ref 7–16)
BILIRUBIN TOTAL: 0.3 mg/dL (ref 0.2–1.0)
BUN: 6 mg/dL — AB (ref 7–18)
Calcium, Total: 9 mg/dL (ref 8.5–10.1)
Chloride: 106 mmol/L (ref 98–107)
Co2: 32 mmol/L (ref 21–32)
Creatinine: 0.74 mg/dL (ref 0.60–1.30)
EGFR (African American): >60
EGFR (Non-African Amer.): >60
Glucose: 118 mg/dL — ABNORMAL HIGH (ref 65–99)
OSMOLALITY: 286 (ref 275–301)
Potassium: 4.1 mmol/L (ref 3.5–5.1)
SGOT(AST): 22 U/L (ref 15–37)
SGPT (ALT): 32 U/L (ref 12–78)
Sodium: 144 mmol/L (ref 136–145)
Total Protein: 6.8 g/dL (ref 6.4–8.2)

## 2013-11-19 LAB — CBC CANCER CENTER
BASOS ABS: 0 x10 3/mm (ref 0.0–0.1)
BASOS PCT: 0.5 %
EOS ABS: 0 x10 3/mm (ref 0.0–0.7)
Eosinophil %: 0.5 %
HCT: 34 % — AB (ref 40.0–52.0)
HGB: 11.2 g/dL — ABNORMAL LOW (ref 13.0–18.0)
Lymphocyte #: 0.2 x10 3/mm — ABNORMAL LOW (ref 1.0–3.6)
Lymphocyte %: 8.8 %
MCH: 33.2 pg (ref 26.0–34.0)
MCHC: 33.1 g/dL (ref 32.0–36.0)
MCV: 101 fL — AB (ref 80–100)
MONOS PCT: 12.1 %
Monocyte #: 0.2 x10 3/mm (ref 0.2–1.0)
Neutrophil #: 1.4 x10 3/mm (ref 1.4–6.5)
Neutrophil %: 78.1 %
Platelet: 294 x10 3/mm (ref 150–440)
RBC: 3.38 10*6/uL — AB (ref 4.40–5.90)
RDW: 20.6 % — AB (ref 11.5–14.5)
WBC: 1.9 x10 3/mm — CL (ref 3.8–10.6)

## 2013-11-19 LAB — MAGNESIUM: Magnesium: 2.1 mg/dL

## 2013-11-25 LAB — BASIC METABOLIC PANEL
ANION GAP: 8 (ref 7–16)
BUN: 9 mg/dL (ref 7–18)
CALCIUM: 9.1 mg/dL (ref 8.5–10.1)
CO2: 30 mmol/L (ref 21–32)
Chloride: 103 mmol/L (ref 98–107)
Creatinine: 0.95 mg/dL (ref 0.60–1.30)
EGFR (African American): >60
GLUCOSE: 149 mg/dL — AB (ref 65–99)
OSMOLALITY: 283 (ref 275–301)
POTASSIUM: 4.3 mmol/L (ref 3.5–5.1)
Sodium: 141 mmol/L (ref 136–145)

## 2013-11-25 LAB — CBC CANCER CENTER
BASOS ABS: 0 x10 3/mm (ref 0.0–0.1)
Basophil %: 0.7 %
EOS ABS: 0 x10 3/mm (ref 0.0–0.7)
Eosinophil %: 0.9 %
HCT: 36 % — ABNORMAL LOW (ref 40.0–52.0)
HGB: 12.3 g/dL — ABNORMAL LOW (ref 13.0–18.0)
Lymphocyte #: 0.2 x10 3/mm — ABNORMAL LOW (ref 1.0–3.6)
Lymphocyte %: 14.2 %
MCH: 33.9 pg (ref 26.0–34.0)
MCHC: 34.1 g/dL (ref 32.0–36.0)
MCV: 100 fL (ref 80–100)
MONOS PCT: 5.5 %
Monocyte #: 0.1 x10 3/mm — ABNORMAL LOW (ref 0.2–1.0)
NEUTROS ABS: 1.3 x10 3/mm — AB (ref 1.4–6.5)
Neutrophil %: 78.7 %
Platelet: 349 x10 3/mm (ref 150–440)
RBC: 3.62 10*6/uL — AB (ref 4.40–5.90)
RDW: 20.9 % — AB (ref 11.5–14.5)
WBC: 1.6 x10 3/mm — CL (ref 3.8–10.6)

## 2013-11-26 LAB — COMPREHENSIVE METABOLIC PANEL
ALK PHOS: 141 U/L — AB
ALT: 29 U/L (ref 12–78)
Albumin: 2.9 g/dL — ABNORMAL LOW (ref 3.4–5.0)
Anion Gap: 7 (ref 7–16)
BUN: 11 mg/dL (ref 7–18)
Bilirubin,Total: 0.3 mg/dL (ref 0.2–1.0)
Calcium, Total: 8.7 mg/dL (ref 8.5–10.1)
Chloride: 102 mmol/L (ref 98–107)
Co2: 30 mmol/L (ref 21–32)
Creatinine: 0.67 mg/dL (ref 0.60–1.30)
EGFR (African American): >60
EGFR (Non-African Amer.): >60
GLUCOSE: 109 mg/dL — AB (ref 65–99)
OSMOLALITY: 278 (ref 275–301)
Potassium: 3.5 mmol/L (ref 3.5–5.1)
SGOT(AST): 19 U/L (ref 15–37)
SODIUM: 139 mmol/L (ref 136–145)
Total Protein: 6.5 g/dL (ref 6.4–8.2)

## 2013-11-26 LAB — CBC CANCER CENTER
BASOS ABS: 0 x10 3/mm (ref 0.0–0.1)
Basophil %: 0.8 %
EOS PCT: 1.5 %
Eosinophil #: 0 x10 3/mm (ref 0.0–0.7)
HCT: 31.4 % — ABNORMAL LOW (ref 40.0–52.0)
HGB: 10.8 g/dL — ABNORMAL LOW (ref 13.0–18.0)
LYMPHS ABS: 0.2 x10 3/mm — AB (ref 1.0–3.6)
Lymphocyte %: 6.9 %
MCH: 34.1 pg — ABNORMAL HIGH (ref 26.0–34.0)
MCHC: 34.3 g/dL (ref 32.0–36.0)
MCV: 99 fL (ref 80–100)
MONO ABS: 0.2 x10 3/mm (ref 0.2–1.0)
Monocyte %: 7.2 %
NEUTROS ABS: 1.9 x10 3/mm (ref 1.4–6.5)
NEUTROS PCT: 83.6 %
Platelet: 326 x10 3/mm (ref 150–440)
RBC: 3.16 10*6/uL — ABNORMAL LOW (ref 4.40–5.90)
RDW: 20.6 % — AB (ref 11.5–14.5)
WBC: 2.3 x10 3/mm — AB (ref 3.8–10.6)

## 2013-12-10 LAB — CBC CANCER CENTER
BASOS ABS: 0 x10 3/mm (ref 0.0–0.1)
BASOS PCT: 1.3 %
EOS PCT: 1.3 %
Eosinophil #: 0 x10 3/mm (ref 0.0–0.7)
HCT: 32.2 % — AB (ref 40.0–52.0)
HGB: 10.6 g/dL — ABNORMAL LOW (ref 13.0–18.0)
LYMPHS ABS: 0.2 x10 3/mm — AB (ref 1.0–3.6)
LYMPHS PCT: 8.8 %
MCH: 34.3 pg — AB (ref 26.0–34.0)
MCHC: 32.8 g/dL (ref 32.0–36.0)
MCV: 105 fL — ABNORMAL HIGH (ref 80–100)
Monocyte #: 0.4 x10 3/mm (ref 0.2–1.0)
Monocyte %: 17.3 %
Neutrophil #: 1.5 x10 3/mm (ref 1.4–6.5)
Neutrophil %: 71.3 %
Platelet: 246 x10 3/mm (ref 150–440)
RBC: 3.08 10*6/uL — ABNORMAL LOW (ref 4.40–5.90)
RDW: 21.9 % — ABNORMAL HIGH (ref 11.5–14.5)
WBC: 2.1 x10 3/mm — ABNORMAL LOW (ref 3.8–10.6)

## 2013-12-10 LAB — COMPREHENSIVE METABOLIC PANEL
ALBUMIN: 2.9 g/dL — AB (ref 3.4–5.0)
Alkaline Phosphatase: 134 U/L — ABNORMAL HIGH
Anion Gap: 7 (ref 7–16)
BUN: 8 mg/dL (ref 7–18)
Bilirubin,Total: 0.4 mg/dL (ref 0.2–1.0)
CALCIUM: 8.2 mg/dL — AB (ref 8.5–10.1)
CO2: 28 mmol/L (ref 21–32)
CREATININE: 0.75 mg/dL (ref 0.60–1.30)
Chloride: 105 mmol/L (ref 98–107)
EGFR (Non-African Amer.): >60
GLUCOSE: 88 mg/dL (ref 65–99)
OSMOLALITY: 277 (ref 275–301)
Potassium: 3.6 mmol/L (ref 3.5–5.1)
SGOT(AST): 28 U/L (ref 15–37)
SGPT (ALT): 27 U/L
Sodium: 140 mmol/L (ref 136–145)
TOTAL PROTEIN: 6.4 g/dL (ref 6.4–8.2)

## 2013-12-10 LAB — MAGNESIUM: Magnesium: 1.7 mg/dL — ABNORMAL LOW

## 2013-12-12 ENCOUNTER — Ambulatory Visit: Payer: Self-pay | Admitting: Oncology

## 2013-12-17 LAB — CBC CANCER CENTER
BASOS PCT: 0.7 %
Basophil #: 0 x10 3/mm (ref 0.0–0.1)
Eosinophil #: 0 x10 3/mm (ref 0.0–0.7)
Eosinophil %: 1 %
HCT: 30.7 % — AB (ref 40.0–52.0)
HGB: 10.3 g/dL — ABNORMAL LOW (ref 13.0–18.0)
LYMPHS ABS: 0.1 x10 3/mm — AB (ref 1.0–3.6)
Lymphocyte %: 4.7 %
MCH: 34.8 pg — ABNORMAL HIGH (ref 26.0–34.0)
MCHC: 33.7 g/dL (ref 32.0–36.0)
MCV: 103 fL — ABNORMAL HIGH (ref 80–100)
MONOS PCT: 4.3 %
Monocyte #: 0.1 x10 3/mm — ABNORMAL LOW (ref 0.2–1.0)
Neutrophil #: 2.5 x10 3/mm (ref 1.4–6.5)
Neutrophil %: 89.3 %
PLATELETS: 164 x10 3/mm (ref 150–440)
RBC: 2.98 10*6/uL — ABNORMAL LOW (ref 4.40–5.90)
RDW: 20.8 % — ABNORMAL HIGH (ref 11.5–14.5)
WBC: 2.8 x10 3/mm — AB (ref 3.8–10.6)

## 2013-12-24 LAB — CBC CANCER CENTER
Basophil #: 0 x10 3/mm (ref 0.0–0.1)
Basophil %: 1.1 %
EOS ABS: 0 x10 3/mm (ref 0.0–0.7)
EOS PCT: 1.9 %
HCT: 30.5 % — AB (ref 40.0–52.0)
HGB: 10.2 g/dL — ABNORMAL LOW (ref 13.0–18.0)
Lymphocyte #: 0.2 x10 3/mm — ABNORMAL LOW (ref 1.0–3.6)
Lymphocyte %: 9.4 %
MCH: 34.9 pg — ABNORMAL HIGH (ref 26.0–34.0)
MCHC: 33.3 g/dL (ref 32.0–36.0)
MCV: 105 fL — AB (ref 80–100)
MONOS PCT: 7.9 %
Monocyte #: 0.2 x10 3/mm (ref 0.2–1.0)
NEUTROS PCT: 79.7 %
Neutrophil #: 1.6 x10 3/mm (ref 1.4–6.5)
Platelet: 193 x10 3/mm (ref 150–440)
RBC: 2.91 10*6/uL — ABNORMAL LOW (ref 4.40–5.90)
RDW: 19.8 % — ABNORMAL HIGH (ref 11.5–14.5)
WBC: 2 x10 3/mm — AB (ref 3.8–10.6)

## 2013-12-31 LAB — CBC CANCER CENTER
BASOS ABS: 0 x10 3/mm (ref 0.0–0.1)
BASOS PCT: 1.3 %
Eosinophil #: 0 x10 3/mm (ref 0.0–0.7)
Eosinophil %: 2.1 %
HCT: 30.4 % — AB (ref 40.0–52.0)
HGB: 10.2 g/dL — ABNORMAL LOW (ref 13.0–18.0)
Lymphocyte #: 0.2 x10 3/mm — ABNORMAL LOW (ref 1.0–3.6)
Lymphocyte %: 10.1 %
MCH: 35.5 pg — ABNORMAL HIGH (ref 26.0–34.0)
MCHC: 33.6 g/dL (ref 32.0–36.0)
MCV: 106 fL — ABNORMAL HIGH (ref 80–100)
Monocyte #: 0.2 x10 3/mm (ref 0.2–1.0)
Monocyte %: 14.1 %
Neutrophil #: 1.2 x10 3/mm — ABNORMAL LOW (ref 1.4–6.5)
Neutrophil %: 72.4 %
Platelet: 256 x10 3/mm (ref 150–440)
RBC: 2.88 10*6/uL — ABNORMAL LOW (ref 4.40–5.90)
RDW: 20.5 % — ABNORMAL HIGH (ref 11.5–14.5)
WBC: 1.6 x10 3/mm — AB (ref 3.8–10.6)

## 2013-12-31 LAB — COMPREHENSIVE METABOLIC PANEL
ALBUMIN: 2.8 g/dL — AB (ref 3.4–5.0)
ALT: 23 U/L
ANION GAP: 9 (ref 7–16)
Alkaline Phosphatase: 134 U/L — ABNORMAL HIGH
BILIRUBIN TOTAL: 0.4 mg/dL (ref 0.2–1.0)
BUN: 7 mg/dL (ref 7–18)
CHLORIDE: 104 mmol/L (ref 98–107)
CO2: 29 mmol/L (ref 21–32)
Calcium, Total: 8.1 mg/dL — ABNORMAL LOW (ref 8.5–10.1)
Creatinine: 0.93 mg/dL (ref 0.60–1.30)
EGFR (African American): >60
EGFR (Non-African Amer.): >60
Glucose: 122 mg/dL — ABNORMAL HIGH (ref 65–99)
OSMOLALITY: 282 (ref 275–301)
Potassium: 3.4 mmol/L — ABNORMAL LOW (ref 3.5–5.1)
SGOT(AST): 21 U/L (ref 15–37)
Sodium: 142 mmol/L (ref 136–145)
Total Protein: 6.1 g/dL — ABNORMAL LOW (ref 6.4–8.2)

## 2014-01-07 LAB — CBC CANCER CENTER
Basophil #: 0 x10 3/mm (ref 0.0–0.1)
Basophil %: 0.6 %
EOS PCT: 0.6 %
Eosinophil #: 0 x10 3/mm (ref 0.0–0.7)
HCT: 31.5 % — ABNORMAL LOW (ref 40.0–52.0)
HGB: 10.6 g/dL — ABNORMAL LOW (ref 13.0–18.0)
LYMPHS ABS: 0.2 x10 3/mm — AB (ref 1.0–3.6)
LYMPHS PCT: 8.1 %
MCH: 35.6 pg — ABNORMAL HIGH (ref 26.0–34.0)
MCHC: 33.7 g/dL (ref 32.0–36.0)
MCV: 106 fL — AB (ref 80–100)
MONO ABS: 0.1 x10 3/mm — AB (ref 0.2–1.0)
MONOS PCT: 5.3 %
Neutrophil #: 1.7 x10 3/mm (ref 1.4–6.5)
Neutrophil %: 85.4 %
PLATELETS: 304 x10 3/mm (ref 150–440)
RBC: 2.98 10*6/uL — AB (ref 4.40–5.90)
RDW: 19.4 % — ABNORMAL HIGH (ref 11.5–14.5)
WBC: 2 x10 3/mm — CL (ref 3.8–10.6)

## 2014-01-12 ENCOUNTER — Ambulatory Visit: Payer: Self-pay | Admitting: Oncology

## 2014-01-15 ENCOUNTER — Emergency Department: Payer: Self-pay | Admitting: Emergency Medicine

## 2014-01-20 ENCOUNTER — Ambulatory Visit: Payer: Self-pay | Admitting: Oncology

## 2014-01-21 LAB — COMPREHENSIVE METABOLIC PANEL
Albumin: 2.7 g/dL — ABNORMAL LOW (ref 3.4–5.0)
Alkaline Phosphatase: 132 U/L — ABNORMAL HIGH
Anion Gap: 10 (ref 7–16)
BUN: 10 mg/dL (ref 7–18)
Bilirubin,Total: 0.4 mg/dL (ref 0.2–1.0)
CALCIUM: 8.2 mg/dL — AB (ref 8.5–10.1)
CHLORIDE: 105 mmol/L (ref 98–107)
Co2: 27 mmol/L (ref 21–32)
Creatinine: 0.9 mg/dL (ref 0.60–1.30)
EGFR (African American): >60
GLUCOSE: 145 mg/dL — AB (ref 65–99)
Osmolality: 285 (ref 275–301)
Potassium: 3.4 mmol/L — ABNORMAL LOW (ref 3.5–5.1)
SGOT(AST): 19 U/L (ref 15–37)
SGPT (ALT): 22 U/L
Sodium: 142 mmol/L (ref 136–145)
Total Protein: 6.1 g/dL — ABNORMAL LOW (ref 6.4–8.2)

## 2014-01-21 LAB — CBC CANCER CENTER
Basophil #: 0 x10 3/mm (ref 0.0–0.1)
Basophil %: 0.7 %
Eosinophil #: 0 x10 3/mm (ref 0.0–0.7)
Eosinophil %: 0.8 %
HCT: 31.8 % — ABNORMAL LOW (ref 40.0–52.0)
HGB: 10.6 g/dL — ABNORMAL LOW (ref 13.0–18.0)
Lymphocyte #: 0.2 x10 3/mm — ABNORMAL LOW (ref 1.0–3.6)
Lymphocyte %: 6.9 %
MCH: 36.1 pg — ABNORMAL HIGH (ref 26.0–34.0)
MCHC: 33.4 g/dL (ref 32.0–36.0)
MCV: 108 fL — ABNORMAL HIGH (ref 80–100)
Monocyte #: 0.3 x10 3/mm (ref 0.2–1.0)
Monocyte %: 10.9 %
Neutrophil #: 1.9 x10 3/mm (ref 1.4–6.5)
Neutrophil %: 80.7 %
Platelet: 255 x10 3/mm (ref 150–440)
RBC: 2.94 10*6/uL — ABNORMAL LOW (ref 4.40–5.90)
RDW: 18.3 % — ABNORMAL HIGH (ref 11.5–14.5)
WBC: 2.3 x10 3/mm — ABNORMAL LOW (ref 3.8–10.6)

## 2014-01-21 LAB — MAGNESIUM: MAGNESIUM: 1.6 mg/dL — AB

## 2014-01-28 LAB — CBC CANCER CENTER
Basophil #: 0 x10 3/mm (ref 0.0–0.1)
Basophil %: 0.6 %
EOS ABS: 0 x10 3/mm (ref 0.0–0.7)
Eosinophil %: 0.7 %
HCT: 34.6 % — AB (ref 40.0–52.0)
HGB: 11.4 g/dL — AB (ref 13.0–18.0)
LYMPHS ABS: 0.2 x10 3/mm — AB (ref 1.0–3.6)
Lymphocyte %: 6.4 %
MCH: 35.9 pg — AB (ref 26.0–34.0)
MCHC: 32.9 g/dL (ref 32.0–36.0)
MCV: 109 fL — ABNORMAL HIGH (ref 80–100)
MONOS PCT: 5 %
Monocyte #: 0.2 x10 3/mm (ref 0.2–1.0)
Neutrophil #: 2.8 x10 3/mm (ref 1.4–6.5)
Neutrophil %: 87.3 %
Platelet: 232 x10 3/mm (ref 150–440)
RBC: 3.18 10*6/uL — ABNORMAL LOW (ref 4.40–5.90)
RDW: 16.8 % — ABNORMAL HIGH (ref 11.5–14.5)
WBC: 3.2 x10 3/mm — ABNORMAL LOW (ref 3.8–10.6)

## 2014-02-04 LAB — CBC CANCER CENTER
BASOS ABS: 0 x10 3/mm (ref 0.0–0.1)
BASOS PCT: 0.7 %
Eosinophil #: 0.1 x10 3/mm (ref 0.0–0.7)
Eosinophil %: 2.5 %
HCT: 33.4 % — ABNORMAL LOW (ref 40.0–52.0)
HGB: 11.1 g/dL — ABNORMAL LOW (ref 13.0–18.0)
LYMPHS ABS: 0.2 x10 3/mm — AB (ref 1.0–3.6)
Lymphocyte %: 5.5 %
MCH: 36.2 pg — ABNORMAL HIGH (ref 26.0–34.0)
MCHC: 33.3 g/dL (ref 32.0–36.0)
MCV: 109 fL — AB (ref 80–100)
MONO ABS: 0.3 x10 3/mm (ref 0.2–1.0)
Monocyte %: 6.9 %
NEUTROS PCT: 84.4 %
Neutrophil #: 3.8 x10 3/mm (ref 1.4–6.5)
Platelet: 229 x10 3/mm (ref 150–440)
RBC: 3.07 10*6/uL — AB (ref 4.40–5.90)
RDW: 16.4 % — ABNORMAL HIGH (ref 11.5–14.5)
WBC: 4.5 x10 3/mm (ref 3.8–10.6)

## 2014-02-04 LAB — MAGNESIUM: MAGNESIUM: 1.5 mg/dL — AB

## 2014-02-04 LAB — POTASSIUM: Potassium: 3.2 mmol/L — ABNORMAL LOW (ref 3.5–5.1)

## 2014-02-11 ENCOUNTER — Ambulatory Visit: Payer: Self-pay | Admitting: Oncology

## 2014-02-15 ENCOUNTER — Ambulatory Visit: Payer: Self-pay | Admitting: Otolaryngology

## 2014-02-18 LAB — COMPREHENSIVE METABOLIC PANEL
AST: 17 U/L (ref 15–37)
Albumin: 2.8 g/dL — ABNORMAL LOW (ref 3.4–5.0)
Alkaline Phosphatase: 136 U/L — ABNORMAL HIGH
Anion Gap: 8 (ref 7–16)
BUN: 5 mg/dL — AB (ref 7–18)
Bilirubin,Total: 0.3 mg/dL (ref 0.2–1.0)
CALCIUM: 8.6 mg/dL (ref 8.5–10.1)
CO2: 28 mmol/L (ref 21–32)
Chloride: 105 mmol/L (ref 98–107)
Creatinine: 0.8 mg/dL (ref 0.60–1.30)
EGFR (African American): >60
GLUCOSE: 132 mg/dL — AB (ref 65–99)
OSMOLALITY: 280 (ref 275–301)
POTASSIUM: 3.3 mmol/L — AB (ref 3.5–5.1)
SGPT (ALT): 25 U/L
Sodium: 141 mmol/L (ref 136–145)
Total Protein: 6.3 g/dL — ABNORMAL LOW (ref 6.4–8.2)

## 2014-02-18 LAB — CBC CANCER CENTER
BASOS ABS: 0 x10 3/mm (ref 0.0–0.1)
Basophil %: 0.4 %
EOS PCT: 2.6 %
Eosinophil #: 0.1 x10 3/mm (ref 0.0–0.7)
HCT: 36.9 % — ABNORMAL LOW (ref 40.0–52.0)
HGB: 12 g/dL — ABNORMAL LOW (ref 13.0–18.0)
LYMPHS ABS: 0.2 x10 3/mm — AB (ref 1.0–3.6)
Lymphocyte %: 5.9 %
MCH: 35 pg — ABNORMAL HIGH (ref 26.0–34.0)
MCHC: 32.5 g/dL (ref 32.0–36.0)
MCV: 108 fL — ABNORMAL HIGH (ref 80–100)
MONO ABS: 0.2 x10 3/mm (ref 0.2–1.0)
Monocyte %: 5.4 %
Neutrophil #: 2.8 x10 3/mm (ref 1.4–6.5)
Neutrophil %: 85.7 %
PLATELETS: 253 x10 3/mm (ref 150–440)
RBC: 3.43 10*6/uL — AB (ref 4.40–5.90)
RDW: 14.5 % (ref 11.5–14.5)
WBC: 3.3 x10 3/mm — AB (ref 3.8–10.6)

## 2014-02-18 LAB — MAGNESIUM: Magnesium: 1.5 mg/dL — ABNORMAL LOW

## 2014-02-25 LAB — MAGNESIUM: MAGNESIUM: 1.6 mg/dL — AB

## 2014-02-25 LAB — CBC CANCER CENTER
BASOS PCT: 0.7 %
Basophil #: 0 x10 3/mm (ref 0.0–0.1)
Eosinophil #: 0.1 x10 3/mm (ref 0.0–0.7)
Eosinophil %: 2.2 %
HCT: 37 % — AB (ref 40.0–52.0)
HGB: 12.3 g/dL — ABNORMAL LOW (ref 13.0–18.0)
LYMPHS PCT: 6.7 %
Lymphocyte #: 0.3 x10 3/mm — ABNORMAL LOW (ref 1.0–3.6)
MCH: 35.4 pg — AB (ref 26.0–34.0)
MCHC: 33.2 g/dL (ref 32.0–36.0)
MCV: 107 fL — AB (ref 80–100)
Monocyte #: 0.3 x10 3/mm (ref 0.2–1.0)
Monocyte %: 8.5 %
NEUTROS ABS: 3.1 x10 3/mm (ref 1.4–6.5)
Neutrophil %: 81.9 %
Platelet: 256 x10 3/mm (ref 150–440)
RBC: 3.47 10*6/uL — ABNORMAL LOW (ref 4.40–5.90)
RDW: 14.1 % (ref 11.5–14.5)
WBC: 3.8 x10 3/mm (ref 3.8–10.6)

## 2014-02-25 LAB — POTASSIUM: Potassium: 3.2 mmol/L — ABNORMAL LOW (ref 3.5–5.1)

## 2014-03-14 ENCOUNTER — Ambulatory Visit: Payer: Self-pay | Admitting: Oncology

## 2014-03-15 LAB — CBC CANCER CENTER
Basophil #: 0 x10 3/mm (ref 0.0–0.1)
Basophil %: 0.7 %
EOS ABS: 0.1 x10 3/mm (ref 0.0–0.7)
EOS PCT: 2.2 %
HCT: 38.6 % — ABNORMAL LOW (ref 40.0–52.0)
HGB: 12.8 g/dL — ABNORMAL LOW (ref 13.0–18.0)
LYMPHS PCT: 5.5 %
Lymphocyte #: 0.3 x10 3/mm — ABNORMAL LOW (ref 1.0–3.6)
MCH: 34.3 pg — ABNORMAL HIGH (ref 26.0–34.0)
MCHC: 33.1 g/dL (ref 32.0–36.0)
MCV: 104 fL — ABNORMAL HIGH (ref 80–100)
Monocyte #: 0.4 x10 3/mm (ref 0.2–1.0)
Monocyte %: 7.7 %
NEUTROS ABS: 4 x10 3/mm (ref 1.4–6.5)
Neutrophil %: 83.9 %
Platelet: 291 x10 3/mm (ref 150–440)
RBC: 3.72 10*6/uL — ABNORMAL LOW (ref 4.40–5.90)
RDW: 13.5 % (ref 11.5–14.5)
WBC: 4.8 x10 3/mm (ref 3.8–10.6)

## 2014-03-15 LAB — COMPREHENSIVE METABOLIC PANEL
ALBUMIN: 3.1 g/dL — AB (ref 3.4–5.0)
ANION GAP: 11 (ref 7–16)
Alkaline Phosphatase: 149 U/L — ABNORMAL HIGH
BUN: 6 mg/dL — ABNORMAL LOW (ref 7–18)
Bilirubin,Total: 0.4 mg/dL (ref 0.2–1.0)
Calcium, Total: 9 mg/dL (ref 8.5–10.1)
Chloride: 101 mmol/L (ref 98–107)
Co2: 28 mmol/L (ref 21–32)
Creatinine: 0.85 mg/dL (ref 0.60–1.30)
Glucose: 105 mg/dL — ABNORMAL HIGH (ref 65–99)
OSMOLALITY: 277 (ref 275–301)
Potassium: 3.3 mmol/L — ABNORMAL LOW (ref 3.5–5.1)
SGOT(AST): 15 U/L (ref 15–37)
SGPT (ALT): 19 U/L
Sodium: 140 mmol/L (ref 136–145)
TOTAL PROTEIN: 6.9 g/dL (ref 6.4–8.2)

## 2014-04-01 LAB — CBC CANCER CENTER
BASOS PCT: 0.4 %
Basophil #: 0 x10 3/mm (ref 0.0–0.1)
Eosinophil #: 0 x10 3/mm (ref 0.0–0.7)
Eosinophil %: 1.1 %
HCT: 37.3 % — ABNORMAL LOW (ref 40.0–52.0)
HGB: 12 g/dL — ABNORMAL LOW (ref 13.0–18.0)
LYMPHS ABS: 0.2 x10 3/mm — AB (ref 1.0–3.6)
Lymphocyte %: 4.6 %
MCH: 32.9 pg (ref 26.0–34.0)
MCHC: 32.2 g/dL (ref 32.0–36.0)
MCV: 102 fL — ABNORMAL HIGH (ref 80–100)
MONO ABS: 0.3 x10 3/mm (ref 0.2–1.0)
Monocyte %: 6 %
NEUTROS ABS: 3.7 x10 3/mm (ref 1.4–6.5)
Neutrophil %: 87.9 %
Platelet: 273 x10 3/mm (ref 150–440)
RBC: 3.66 10*6/uL — ABNORMAL LOW (ref 4.40–5.90)
RDW: 13.8 % (ref 11.5–14.5)
WBC: 4.2 x10 3/mm (ref 3.8–10.6)

## 2014-04-01 LAB — COMPREHENSIVE METABOLIC PANEL
AST: 13 U/L — AB (ref 15–37)
Albumin: 2.8 g/dL — ABNORMAL LOW (ref 3.4–5.0)
Alkaline Phosphatase: 123 U/L — ABNORMAL HIGH
Anion Gap: 7 (ref 7–16)
BUN: 7 mg/dL (ref 7–18)
Bilirubin,Total: 0.3 mg/dL (ref 0.2–1.0)
CALCIUM: 9.3 mg/dL (ref 8.5–10.1)
CHLORIDE: 103 mmol/L (ref 98–107)
CO2: 30 mmol/L (ref 21–32)
Creatinine: 0.88 mg/dL (ref 0.60–1.30)
EGFR (Non-African Amer.): >60
Glucose: 92 mg/dL (ref 65–99)
Osmolality: 277 (ref 275–301)
POTASSIUM: 3.8 mmol/L (ref 3.5–5.1)
SGPT (ALT): 16 U/L
SODIUM: 140 mmol/L (ref 136–145)
Total Protein: 6.9 g/dL (ref 6.4–8.2)

## 2014-04-01 LAB — MAGNESIUM: Magnesium: 1.9 mg/dL

## 2014-04-13 ENCOUNTER — Ambulatory Visit: Payer: Self-pay | Admitting: Oncology

## 2014-04-26 LAB — MAGNESIUM: MAGNESIUM: 2 mg/dL

## 2014-04-26 LAB — COMPREHENSIVE METABOLIC PANEL
Albumin: 2.8 g/dL — ABNORMAL LOW (ref 3.4–5.0)
Alkaline Phosphatase: 142 U/L — ABNORMAL HIGH
Anion Gap: 7 (ref 7–16)
BUN: 9 mg/dL (ref 7–18)
Bilirubin,Total: 0.3 mg/dL (ref 0.2–1.0)
CALCIUM: 8.5 mg/dL (ref 8.5–10.1)
CHLORIDE: 101 mmol/L (ref 98–107)
CREATININE: 0.95 mg/dL (ref 0.60–1.30)
Co2: 29 mmol/L (ref 21–32)
GLUCOSE: 125 mg/dL — AB (ref 65–99)
OSMOLALITY: 274 (ref 275–301)
Potassium: 3.6 mmol/L (ref 3.5–5.1)
SGOT(AST): 12 U/L — ABNORMAL LOW (ref 15–37)
SGPT (ALT): 16 U/L
Sodium: 137 mmol/L (ref 136–145)
TOTAL PROTEIN: 7 g/dL (ref 6.4–8.2)

## 2014-04-26 LAB — CBC CANCER CENTER
BASOS ABS: 0 x10 3/mm (ref 0.0–0.1)
Basophil %: 0.5 %
Eosinophil #: 0.1 x10 3/mm (ref 0.0–0.7)
Eosinophil %: 2.1 %
HCT: 35.7 % — AB (ref 40.0–52.0)
HGB: 11.7 g/dL — ABNORMAL LOW (ref 13.0–18.0)
LYMPHS ABS: 0.3 x10 3/mm — AB (ref 1.0–3.6)
Lymphocyte %: 6.6 %
MCH: 31.7 pg (ref 26.0–34.0)
MCHC: 32.8 g/dL (ref 32.0–36.0)
MCV: 97 fL (ref 80–100)
Monocyte #: 0.3 x10 3/mm (ref 0.2–1.0)
Monocyte %: 6.4 %
NEUTROS ABS: 4 x10 3/mm (ref 1.4–6.5)
Neutrophil %: 84.4 %
PLATELETS: 278 x10 3/mm (ref 150–440)
RBC: 3.69 10*6/uL — AB (ref 4.40–5.90)
RDW: 14 % (ref 11.5–14.5)
WBC: 4.8 x10 3/mm (ref 3.8–10.6)

## 2014-05-14 ENCOUNTER — Ambulatory Visit: Payer: Self-pay | Admitting: Oncology

## 2014-05-17 LAB — CBC CANCER CENTER
BASOS ABS: 0 x10 3/mm (ref 0.0–0.1)
Basophil %: 0.5 %
Eosinophil #: 0.1 x10 3/mm (ref 0.0–0.7)
Eosinophil %: 2.5 %
HCT: 36.2 % — AB (ref 40.0–52.0)
HGB: 11.7 g/dL — ABNORMAL LOW (ref 13.0–18.0)
LYMPHS PCT: 7.3 %
Lymphocyte #: 0.4 x10 3/mm — ABNORMAL LOW (ref 1.0–3.6)
MCH: 30.3 pg (ref 26.0–34.0)
MCHC: 32.4 g/dL (ref 32.0–36.0)
MCV: 94 fL (ref 80–100)
MONOS PCT: 6.5 %
Monocyte #: 0.3 x10 3/mm (ref 0.2–1.0)
NEUTROS ABS: 4.3 x10 3/mm (ref 1.4–6.5)
Neutrophil %: 83.2 %
PLATELETS: 396 x10 3/mm (ref 150–440)
RBC: 3.87 10*6/uL — ABNORMAL LOW (ref 4.40–5.90)
RDW: 14.6 % — ABNORMAL HIGH (ref 11.5–14.5)
WBC: 5.2 x10 3/mm (ref 3.8–10.6)

## 2014-05-17 LAB — COMPREHENSIVE METABOLIC PANEL
ANION GAP: 10 (ref 7–16)
AST: 17 U/L (ref 15–37)
Albumin: 2.7 g/dL — ABNORMAL LOW (ref 3.4–5.0)
Alkaline Phosphatase: 150 U/L — ABNORMAL HIGH
BUN: 12 mg/dL (ref 7–18)
Bilirubin,Total: 0.3 mg/dL (ref 0.2–1.0)
CO2: 28 mmol/L (ref 21–32)
Calcium, Total: 8.9 mg/dL (ref 8.5–10.1)
Chloride: 97 mmol/L — ABNORMAL LOW (ref 98–107)
Creatinine: 1.03 mg/dL (ref 0.60–1.30)
EGFR (African American): >60
EGFR (Non-African Amer.): >60
Glucose: 135 mg/dL — ABNORMAL HIGH (ref 65–99)
OSMOLALITY: 272 (ref 275–301)
Potassium: 3.9 mmol/L (ref 3.5–5.1)
SGPT (ALT): 26 U/L
SODIUM: 135 mmol/L — AB (ref 136–145)
Total Protein: 7.5 g/dL (ref 6.4–8.2)

## 2014-05-17 LAB — TSH: THYROID STIMULATING HORM: 5.15 u[IU]/mL — AB

## 2014-05-31 ENCOUNTER — Ambulatory Visit: Payer: Self-pay | Admitting: Oncology

## 2014-06-07 LAB — CBC CANCER CENTER
BASOS ABS: 0 x10 3/mm (ref 0.0–0.1)
Basophil %: 0.4 %
Eosinophil #: 0.1 x10 3/mm (ref 0.0–0.7)
Eosinophil %: 2.1 %
HCT: 33 % — ABNORMAL LOW (ref 40.0–52.0)
HGB: 10.7 g/dL — ABNORMAL LOW (ref 13.0–18.0)
LYMPHS PCT: 4.6 %
Lymphocyte #: 0.3 x10 3/mm — ABNORMAL LOW (ref 1.0–3.6)
MCH: 28.8 pg (ref 26.0–34.0)
MCHC: 32.4 g/dL (ref 32.0–36.0)
MCV: 89 fL (ref 80–100)
MONO ABS: 0.5 x10 3/mm (ref 0.2–1.0)
Monocyte %: 8.1 %
NEUTROS PCT: 84.8 %
Neutrophil #: 4.8 x10 3/mm (ref 1.4–6.5)
PLATELETS: 397 x10 3/mm (ref 150–440)
RBC: 3.72 10*6/uL — ABNORMAL LOW (ref 4.40–5.90)
RDW: 14.9 % — AB (ref 11.5–14.5)
WBC: 5.7 x10 3/mm (ref 3.8–10.6)

## 2014-06-07 LAB — COMPREHENSIVE METABOLIC PANEL
Albumin: 2.4 g/dL — ABNORMAL LOW (ref 3.4–5.0)
Alkaline Phosphatase: 168 U/L — ABNORMAL HIGH
Anion Gap: 6 — ABNORMAL LOW (ref 7–16)
BUN: 11 mg/dL (ref 7–18)
Bilirubin,Total: 0.4 mg/dL (ref 0.2–1.0)
Calcium, Total: 8.8 mg/dL (ref 8.5–10.1)
Chloride: 99 mmol/L (ref 98–107)
Co2: 32 mmol/L (ref 21–32)
Creatinine: 0.98 mg/dL (ref 0.60–1.30)
EGFR (African American): >60
EGFR (Non-African Amer.): >60
Glucose: 85 mg/dL (ref 65–99)
Osmolality: 272 (ref 275–301)
Potassium: 4 mmol/L (ref 3.5–5.1)
SGOT(AST): 16 U/L (ref 15–37)
SGPT (ALT): 25 U/L
Sodium: 137 mmol/L (ref 136–145)
Total Protein: 7 g/dL (ref 6.4–8.2)

## 2014-06-07 LAB — TSH: Thyroid Stimulating Horm: 3.77 u[IU]/mL

## 2014-06-07 LAB — MAGNESIUM: Magnesium: 2.1 mg/dL

## 2014-06-09 ENCOUNTER — Ambulatory Visit: Payer: Self-pay | Admitting: Vascular Surgery

## 2014-06-09 LAB — CREATININE, SERUM
Creatinine: 0.94 mg/dL (ref 0.60–1.30)
EGFR (African American): >60
EGFR (Non-African Amer.): >60

## 2014-06-09 LAB — BUN: BUN: 7 mg/dL (ref 7–18)

## 2014-06-11 LAB — AEROBIC CULTURE

## 2014-06-14 ENCOUNTER — Ambulatory Visit: Payer: Self-pay | Admitting: Oncology

## 2014-06-15 ENCOUNTER — Encounter: Payer: Self-pay | Admitting: Internal Medicine

## 2014-06-16 LAB — COMPREHENSIVE METABOLIC PANEL
ALBUMIN: 2.2 g/dL — AB (ref 3.4–5.0)
AST: 20 U/L (ref 15–37)
Alkaline Phosphatase: 227 U/L — ABNORMAL HIGH (ref 46–116)
Anion Gap: 5 — ABNORMAL LOW (ref 7–16)
BUN: 8 mg/dL (ref 7–18)
Bilirubin,Total: 0.2 mg/dL (ref 0.2–1.0)
CALCIUM: 8.8 mg/dL (ref 8.5–10.1)
Chloride: 100 mmol/L (ref 98–107)
Co2: 31 mmol/L (ref 21–32)
Creatinine: 0.95 mg/dL (ref 0.60–1.30)
EGFR (Non-African Amer.): >60
GLUCOSE: 100 mg/dL — AB (ref 65–99)
Osmolality: 270 (ref 275–301)
Potassium: 3.9 mmol/L (ref 3.5–5.1)
SGPT (ALT): 30 U/L (ref 14–63)
SODIUM: 136 mmol/L (ref 136–145)
TOTAL PROTEIN: 7 g/dL (ref 6.4–8.2)

## 2014-06-16 LAB — CBC CANCER CENTER
BASOS PCT: 0.5 %
Basophil #: 0 x10 3/mm (ref 0.0–0.1)
EOS PCT: 1.9 %
Eosinophil #: 0.1 x10 3/mm (ref 0.0–0.7)
HCT: 34.8 % — ABNORMAL LOW (ref 40.0–52.0)
HGB: 11.1 g/dL — AB (ref 13.0–18.0)
Lymphocyte #: 0.3 x10 3/mm — ABNORMAL LOW (ref 1.0–3.6)
Lymphocyte %: 5.1 %
MCH: 28.1 pg (ref 26.0–34.0)
MCHC: 32 g/dL (ref 32.0–36.0)
MCV: 88 fL (ref 80–100)
Monocyte #: 0.3 x10 3/mm (ref 0.2–1.0)
Monocyte %: 6.1 %
NEUTROS PCT: 86.4 %
Neutrophil #: 4.6 x10 3/mm (ref 1.4–6.5)
PLATELETS: 527 x10 3/mm — AB (ref 150–440)
RBC: 3.96 10*6/uL — AB (ref 4.40–5.90)
RDW: 15.9 % — ABNORMAL HIGH (ref 11.5–14.5)
WBC: 5.3 x10 3/mm (ref 3.8–10.6)

## 2014-07-13 ENCOUNTER — Ambulatory Visit
Admit: 2014-07-13 | Disposition: A | Discharge status: Home / Self Care | Payer: Self-pay | Attending: Oncology | Admitting: Oncology

## 2014-08-09 LAB — CREATININE, SERUM
Creatinine: 0.9 mg/dL
EGFR (African American): >60
EGFR (Non-African Amer.): >60

## 2014-08-09 LAB — CBC CANCER CENTER
Basophil #: 0 x10 3/mm (ref 0.0–0.1)
Basophil %: 0.1 %
EOS PCT: 0.6 %
Eosinophil #: 0.1 x10 3/mm (ref 0.0–0.7)
HCT: 37.2 % — ABNORMAL LOW (ref 40.0–52.0)
HGB: 11.7 g/dL — AB (ref 13.0–18.0)
LYMPHS ABS: 0.2 x10 3/mm — AB (ref 1.0–3.6)
Lymphocyte %: 2.1 %
MCH: 26.8 pg (ref 26.0–34.0)
MCHC: 31.5 g/dL — AB (ref 32.0–36.0)
MCV: 85 fL (ref 80–100)
Monocyte #: 0.2 x10 3/mm (ref 0.2–1.0)
Monocyte %: 2.1 %
Neutrophil #: 8.7 x10 3/mm — ABNORMAL HIGH (ref 1.4–6.5)
Neutrophil %: 95.1 %
Platelet: 273 x10 3/mm (ref 150–440)
RBC: 4.37 10*6/uL — ABNORMAL LOW (ref 4.40–5.90)
RDW: 17.3 % — AB (ref 11.5–14.5)
WBC: 9.1 x10 3/mm (ref 3.8–10.6)

## 2014-08-13 ENCOUNTER — Ambulatory Visit
Admit: 2014-08-13 | Disposition: A | Discharge status: Home / Self Care | Payer: Self-pay | Attending: Oncology | Admitting: Oncology

## 2014-08-16 LAB — CREATININE, SERUM
Creatinine: 0.73 mg/dL
EGFR (Non-African Amer.): >60

## 2014-08-16 LAB — CBC CANCER CENTER
BASOS ABS: 0 x10 3/mm (ref 0.0–0.1)
BASOS PCT: 0.5 %
Eosinophil #: 0.2 x10 3/mm (ref 0.0–0.7)
Eosinophil %: 3.3 %
HCT: 33 % — ABNORMAL LOW (ref 40.0–52.0)
HGB: 10.5 g/dL — AB (ref 13.0–18.0)
LYMPHS PCT: 6.7 %
Lymphocyte #: 0.3 x10 3/mm — ABNORMAL LOW (ref 1.0–3.6)
MCH: 27 pg (ref 26.0–34.0)
MCHC: 31.9 g/dL — AB (ref 32.0–36.0)
MCV: 85 fL (ref 80–100)
MONOS PCT: 5.2 %
Monocyte #: 0.3 x10 3/mm (ref 0.2–1.0)
NEUTROS PCT: 84.3 %
Neutrophil #: 4.2 x10 3/mm (ref 1.4–6.5)
Platelet: 265 x10 3/mm (ref 150–440)
RBC: 3.89 10*6/uL — ABNORMAL LOW (ref 4.40–5.90)
RDW: 17.7 % — ABNORMAL HIGH (ref 11.5–14.5)
WBC: 5 x10 3/mm (ref 3.8–10.6)

## 2014-08-30 LAB — COMPREHENSIVE METABOLIC PANEL
ALBUMIN: 2.7 g/dL — AB
ALK PHOS: 145 U/L — AB
ALT: 33 U/L
AST: 22 U/L
Anion Gap: 6 — ABNORMAL LOW (ref 7–16)
BUN: 10 mg/dL
Bilirubin,Total: 0.5 mg/dL
Calcium, Total: 9 mg/dL
Chloride: 97 mmol/L — ABNORMAL LOW
Co2: 29 mmol/L
Creatinine: 0.8 mg/dL
EGFR (African American): >60
EGFR (Non-African Amer.): >60
Glucose: 157 mg/dL — ABNORMAL HIGH
Potassium: 3.5 mmol/L
SODIUM: 132 mmol/L — AB
Total Protein: 6 g/dL — ABNORMAL LOW

## 2014-08-30 LAB — CBC CANCER CENTER
BASOS PCT: 0.4 %
Basophil #: 0 x10 3/mm (ref 0.0–0.1)
EOS PCT: 0.4 %
Eosinophil #: 0 x10 3/mm (ref 0.0–0.7)
HCT: 34 % — AB (ref 40.0–52.0)
HGB: 11.1 g/dL — AB (ref 13.0–18.0)
LYMPHS ABS: 0.2 x10 3/mm — AB (ref 1.0–3.6)
Lymphocyte %: 2.2 %
MCH: 28 pg (ref 26.0–34.0)
MCHC: 32.6 g/dL (ref 32.0–36.0)
MCV: 86 fL (ref 80–100)
Monocyte #: 0.3 x10 3/mm (ref 0.2–1.0)
Monocyte %: 3.2 %
NEUTROS ABS: 8.6 x10 3/mm — AB (ref 1.4–6.5)
Neutrophil %: 93.8 %
Platelet: 511 x10 3/mm — ABNORMAL HIGH (ref 150–440)
RBC: 3.96 10*6/uL — AB (ref 4.40–5.90)
RDW: 19.3 % — ABNORMAL HIGH (ref 11.5–14.5)
WBC: 9.2 x10 3/mm (ref 3.8–10.6)

## 2014-09-03 NOTE — Discharge Summary (Signed)
PATIENT NAME:  Corey Sims, Corey Sims MR#:  [id number] DATE OF BIRTH:  Sep 20, 1965  DATE OF ADMISSION:  10/23/2012 DATE OF DISCHARGE:  10/28/2012  ADMITTING DIAGNOSIS: Mediastinal lymphadenopathy, and multiple bilateral pulmonary nodules.   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   OPERATION PERFORMED: Right thoracotomy, with biopsy of subcarinal mass and lung nodules.   HOSPITAL COURSE: Mr. Tremar Wickens is a 49 year old gentleman with a head and neck malignancy who is status post multiple courses of surgery and radiation therapy with chemotherapy. Most recently, he was found to have extensive bilateral mediastinal lymphadenopathy as well as multiple pulmonary nodules. Attempts at biopsy were unsuccessful, and he was offered a thoracotomy for definitive diagnosis. He was brought to the operating room on June 12th where he underwent a muscle-sparing right thoracotomy, with a biopsy of the subcarinal mass and multiple pulmonary nodules. His postoperative course was essentially unremarkable. On the fifth postoperative day his chest tubes were removed and the patient was discharged to home.   He had a very small apical pneumothorax present for several days prior to his discharge. This was stable and in fact was slightly decreased. At the time of discharge, his wounds were healing, as expected. He was given a prescription for Norco 5/325, 1 to 2 tablets every 4 hours as needed. He will continue his Flonase and Peridex as well.   He will come back to see me in 1 week at the [facility]. We will obtain a chest x-ray at that time. All of his discharge instructions were given and the questions were answered.    ____________________________ Lew Dawes Genevive Bi, MD teo:dm D: 10/28/2012 10:19:50 ET T: 10/28/2012 11:50:38 ET JOB#: [id number]  cc: Christia Reading E. Genevive Bi, MD, <Dictator> Jerene Bears, MD Martie Lee. Oliva Bustard, MD Louis Matte MD ELECTRONICALLY SIGNED  11/04/2012 11:01

## 2014-09-03 NOTE — Op Note (Signed)
PATIENT NAME:  Corey Sims, Corey Sims Corey Sims DATE OF BIRTH:  [birth year]  DATE OF PROCEDURE:  10/23/2012  SURGEON:  Christia Reading E. Genevive Bi, M.D.   ASSISTANT:  Jackquline Bosch, M.D.   PREOPERATIVE DIAGNOSIS:  Bilateral hilar adenopathy with multiple lung lesions.    POSTOPERATIVE DIAGNOSIS:  Multiple lung lesions biopsied x 2 and excision of subcarinal mass with frozen section consistent with noncaseating granulomatous inflammation.   OPERATION PERFORMED: 1.  Preoperative bronchoscopy to assess endobronchial anatomy.  2.  Muscle-sparing right thoracotomy with excision of subcarinal mass and lung biopsy x 2, right middle lobe and right lower lobe.   INDICATIONS FOR PROCEDURE:  [name] is a 49 year old gentleman with a history of head and neck cancer and extensive bilateral mediastinal adenopathy with multiple lung lesions.  The patient is being evaluated for extensive chemotherapy and there was a need for accurate diagnosis of his mediastinum and lung.  He was therefore offered the above-named procedure.   DESCRIPTION OF PROCEDURE:  The patient was brought to the operating suite and placed in the supine position.  General endotracheal anesthesia was given with a double-lumen tube.  Preoperative bronchoscopy was carried out.  Within the basilar segments of the right lower lobe there was a polypoid lesion.  This did not appear to be malignant, but was most consistent with a mucosal polyp.  This measured about 2 to 3 mm.  The rest of the lungs were normal.  The patient was then turned for a right thoracotomy.  All pressure points were carefully padded.  The patient was prepped and draped in the usual sterile fashion.  A muscle-sparing thoracotomy was created.  The chest was entered.  We could easily see the extensive hilar and subcarinal adenopathy.  This was resected by incising the pleura overlying this area.  These were sent for frozen section which returned noncaseating granulomas.  Scattered throughout  the lungs were multiple nodules measuring upwards of 4 to 5 mm in size.  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  Two of these were elevated up out of the lung parenchyma and excised using the electrocautery and then closed with 4-0 Prolene.  The frozen section on the lung returned noncaseating granulomas.  A single chest tube was inserted and positioned to the apex.  The chest was then closed with interrupted #2 Vicryl paracostal sutures.  The muscles of the chest wall were allowed to return to their normal anatomic position.  A 15 Blake was placed in the subcutaneous tissues.  The subcutaneous tissues was closed with 2-0 Vicryl and the skin with 4-0 Vicryl.  Benzoin, Steri-Strips and sterile dressings completed the case.  The patient was then rolled in the supine position where he was extubated and taken to the recovery room in stable condition.     ____________________________ Lew Dawes Genevive Bi, MD teo:ea D: 10/23/2012 17:04:47 ET T: 10/23/2012 21:05:53 ET JOB#: [id number]  cc: Christia Reading E. Genevive Bi, MD, <Dictator> Louis Matte MD ELECTRONICALLY SIGNED 10/27/2012 11:25

## 2014-09-03 NOTE — H&P (Signed)
PATIENT NAME:  Corey Sims, Corey Sims MR#:  [id number] DATE OF BIRTH:  1966/03/30  DATE OF ADMISSION:  10/01/2012  PRIMARY CARE PHYSICIAN: Jerene Bears, MD   CHIEF COMPLAINT: Hypotension.   HISTORY OF PRESENT ILLNESS: This is a 49 year old male with a history of head and neck cancer, tongue and left neck mass, who presented to outpatient specialist for an EBUS secondary to diagnosis for persistent mediastinal adenopathy. The patient received Versed, fentanyl, as well as Precedex during the procedure. Post procedure, the patient was hypotensive and had one episode of bradycardia. He actually responded to fluids and was on his way to be discharged, when he got in a wheelchair and was headed out the door when he felt very nauseous and lightheaded. He was brought  back for further evaluation. His blood pressure systolic was 62I. Hospitalist was asked to see the patient for possible admission. The patient is agreeable to observation admission for his hypotension. He is currently receiving a 1 liter normal saline bolus.   REVIEW OF SYSTEMS:   CONSTITUTIONAL: He has not had any fevers. He has fatigue and weakness. EYES: No blurred or double vision, glaucoma or cataracts.  EARS, NOSE, THROAT: He has some difficulty swallowing at times. No postnasal drip or snoring.  RESPIRATORY: No cough, wheezing, hemoptysis, COPD. CARDIOVASCULAR: No chest pain, orthopnea, edema, arrhythmia, dyspnea on exertion, palpitations or syncope.  GASTROINTESTINAL: He had nausea. No vomiting, diarrhea, abdominal pain, melena or ulcers.  GENITOURINARY: No dysuria or hematuria. ENDOCRINE: No polyuria or polydipsia.  HEMATOLOGIC AND LYMPHATIC: No anemia or easy bruising.  SKIN: No rash or lesions.  MUSCULOSKELETAL: No limited activity. No pain in the shoulders or neck.  NEUROLOGIC: No history of CVA, TIA, seizures.  PSYCHIATRIC: No history of anxiety or depression.   PAST MEDICAL HISTORY: The patient has a history of a  squamous cell carcinoma of the tongue and also of the neck.   MEDICATIONS: He is currently taking Peridex.   ALLERGIES: PENICILLIN, ALL MYCINS AND SULFA DRUGS. THEY CAUSE HIVES.   SOCIAL HISTORY: The patient is married. He drinks occasionally. No tobacco use ever.   FAMILY HISTORY: His father has prostate cancer. There is a history of pancreatic and lung as well as kidney cancers in the family.   PAST SURGICAL HISTORY: None.   PHYSICAL EXAMINATION:   VITAL SIGNS: Temperature is 98, pulse 85, respirations 20. Blood pressure is currently 80/61, 99% on room air.  GENERAL: The patient is alert, oriented, not in acute distress.  HEENT: Head is atraumatic. Pupils are round and reactive. Sclerae anicteric. Mucous membranes are dry. Oropharynx is clear.  NECK: Supple. There is no JVD, carotid bruit or enlarged thyroid. Full range of motion without pain.  CARDIOVASCULAR: Regular rate and rhythm. No murmurs, gallops or rubs. PMI is not.  LUNGS: Clear to auscultation without crackles, rales, rhonchi or wheezing. Symmetrical rise and fall. Symmetrical expansion.  ABDOMEN: Bowel sounds are positive. Nontender, nondistended. No hepatosplenomegaly.  EXTREMITIES: No clubbing, cyanosis, edema. BACK: No CVA or vertebral tenderness. SKIN: Without rash or lesions.  NEUROLOGIC: Cranial nerves II through XII are intact. There are no focal deficits.   LABORATORY DATA: There are no laboratories at this time.  ASSESSMENT AND PLAN: This is a 49 year old male who presented for an EBUS secondary to mediastinal adenopathy, who subsequently post procedure had hypotension.  1.  Hypotension persistent secondary to medications given during the procedure. The patient is agreeable for observation. He was being discharging and his symptoms recurred and his  blood pressure is low, so I think it is prudent just to  admit him for observation. We will continue with the  normal saline bolus and 125 an hour. If his blood pressure  does improve and is persistently stable, he may be able to be discharged.  2.  Mediastinal adenopathy, for pathology by Dr. Mortimer Fries today.   CODE STATUS: Full code.  TIME SPENT: Approximately 45 minutes.   ____________________________ Donell Beers. Benjie Karvonen, MD spm:jm D: 10/01/2012 16:51:45 ET T: 10/01/2012 17:24:16 ET JOB#: [id number]  cc: Betta Balla P. Benjie Karvonen, MD, <Dictator> Jerene Bears, MD Donell Beers Akram Kissick MD ELECTRONICALLY SIGNED 10/01/2012 18:10

## 2014-09-03 NOTE — Discharge Summary (Signed)
PATIENT NAME:  Corey Sims, Corey Sims MR#:  [id number] DATE OF BIRTH:  1965-09-14  DATE OF ADMISSION:  10/01/2012 DATE OF DISCHARGE:  10/02/2012  DISCHARGE DIAGNOSES: 1.  Dizziness and hypotension due to medication.  3.  Mass in the neck, under investigation.   CODE STATUS: Full code.   DISCHARGE MEDICATIONS: 1.  Peridex 3 times a day.  2.  Astelin 2 spray once a day as needed for allergy.  3.  Flonase 2 sprays nasal once a day as needed for allergy.   DIET: Regular, diet consistency regular.   ACTIVITY LIMITATION: As tolerated.   TIMEFRAME TO FOLLOWUP: In 1 to 2 weeks with Dr. Oliva Bustard. Advised to continue following with oncology and primary care physician, Dr. Deborra Medina.   HISTORY OF PRESENT ILLNESS:  A 49 year old male with history of head and neck cancer, tongue, left neck mass presented to outpatient speciality for endobronchial ultrasound, second diagnosis of persistent mediastinal adenopathy.  He received Versed, fentanyl as well as Precedex during the procedure.  Post procedure, the patient was hypotensive and had an episode of bradycardia, but actually responded to IV fluid. He got in the wheelchair to head out of the door when he felt very nauseous and lightheaded.  He was brought back for further evaluation. Systolic blood pressure was 70 so he was admitted for observation for his hypotension.   HOSPITAL COURSE AND STAY:  1.  For his dizziness, nausea and hypotension, he received IV fluids and monitored on telemetry floor, remained stable and his blood pressure came up and then stable. He tolerated diet very well.  The next morning walked without any problems. No dizziness so the next day morning he was discharged home.  2.  Mass in the neck. Dr. Oliva Bustard was following for this issue and he was advised to continue following with him.   LAB RESULTS IN THE HOSPITAL: There were no labs done in the admission because his presenting complaint was so obvious and he felt significantly better  after IV fluids.   TOTAL TIME SPENT ON THIS DISCHARGE: 35 minutes.   ____________________________ Ceasar Lund Anselm Jungling, MD vgv:cs D: 10/05/2012 08:14:42 ET T: 10/05/2012 15:45:03 ET JOB#: [id number]  cc: Ceasar Lund. Anselm Jungling, MD, <Dictator> Deborra Medina, MD Martie Lee. Oliva Bustard, MD   Vaughan Basta MD ELECTRONICALLY SIGNED 10/09/2012 14:21

## 2014-09-03 NOTE — Op Note (Signed)
PATIENT NAME:  Corey Sims, Corey Sims MR#:  [id number] DATE OF BIRTH:  1965-11-23  DATE OF PROCEDURE:  02/17/2013  PREOPERATIVE DIAGNOSIS: Tongue carcinoma.   POSTOPERATIVE DIAGNOSIS: Tongue carcinoma.   PROCEDURE PERFORMED: Insertion of right IJ Infuse-a-Port with ultrasound and fluoroscopic guidance.   SURGEON: Hortencia Pilar, M.D.   SEDATION: Versed 4 mg plus fentanyl 150 mcg administered IV. Continuous ECG, pulse oximetry and cardiopulmonary monitoring was performed throughout the entire procedure by the interventional radiology nurse. Total sedation time was 45 minutes.   ACCESS: Right IJ.   FLUOROSCOPY TIME: Less than 1 minute.   CONTRAST USED: None.   INDICATIONS: [name] is a 49 year old gentleman found to have a neck mass. Workup demonstrated carcinoma of the tongue. The risks and benefits for insertion of a port so that the patient can receive his appropriate chemotherapy is reviewed. All questions answered. The patient agrees to proceed.   DESCRIPTION OF PROCEDURE: The patient is taken to special procedures and placed in the supine position. After adequate sedation is achieved, his right neck and chest wall are prepped and draped in a sterile fashion. 1% lidocaine with epinephrine is infiltrated into soft tissues at the base of the neck, as well as in the soft tissues on the chest wall. Ultrasound is placed in a sterile sleeve. Jugular vein is identified. It is echolucent and compressible indicating patency. Image is recorded for the permanent record. Under real-time visualization, Seldinger needle is inserted into the jugular vein percutaneously. J-wire is then advanced and, under fluoroscopic guidance, positioned with the tip in the distal IVC.   An 11 blade scalpel was then used to make a counterincision at the wire insertion site, and a small pocket is created with blunt dissection using a hemostat. Linear incision is then created 2 fingerbreadths below the clavicle, and a small  pocket is created on the chest wall, using both blunt and sharp dissection.   The catheter was then pulled subcutaneously from the pocket to the neck counterincision. Dilator and peel-away sheath is inserted over the wire. The wire and dilator are removed, and the catheter is inserted into the venous system. The peel-away sheath is removed, and under  fluoroscopic guidance, the catheter tip is positioned at the atriocaval junction. The catheter is transected, hub is connected and is slipped into the pocket. The port is then accessed percutaneously with a Huber needle. It aspirates easily, flushes well. It is checked under fluoroscopy and an image is recorded for the permanent record demonstrating smooth contour with good tip position.   The port incision is then closed using interrupted 3-0 Vicryl for the subcutaneous tissues and 4-0 Monocryl for subcuticular. The neck counterincision is closed with 4-0 Monocryl subcuticular, and Dermabond is applied to both incisions. The patient tolerated the procedure well, and there were no immediate complications.     ____________________________ Katha Cabal, MD ggs:dmm D: 02/17/2013 17:51:02 ET T: 02/17/2013 09:55:32 ET JOB#: [id number]  cc: Katha Cabal, MD, <Dictator> Deborra Medina, MD Martie Lee. Oliva Bustard, MD Jerene Bears, MD Katha Cabal MD ELECTRONICALLY SIGNED 02/24/2013 17:53

## 2014-09-05 NOTE — Op Note (Signed)
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  PROCEDURES PERFORMED: 1. Partial glossectomy with multilayered closure.  2. Left selective neck dissection levels 1, 2, and 3.   SURGEON: Jerene Bears, MD   ASSISTANT: Mahlon Gammon, MD   ANESTHESIA: General endotracheal anesthesia.   ESTIMATED BLOOD LOSS: Less than 25 mL.   IV FLUIDS: Please see anesthesia record.   COMPLICATIONS: None.   DRAINS/STENT PLACEMENTS: Left-sided JP drain.   SPECIMENS: Left partial glossectomy, left lateral tongue frozen sections, left neck lymph nodes levels 1A, 1B, 2A, 2B, and 3.   COMPLICATIONS: None.    INDICATIONS FOR PROCEDURE: The patient is a 49 year old male with biopsy proven left lateral tongue squamous cell carcinoma.   OPERATIVE FINDINGS: Approximately 4 cm tumor with a pathologic stage II on frozen section with approximate 0.9 cm depth of invasion. Decision was made to proceed with a left selective neck dissection of levels 1 and 2. The hypoglossal lingual spinal accessory and vagus nerves were identified and preserved.   DESCRIPTION OF PROCEDURE: After the patient was identified in holding, the benefits and risks of the procedure were discussed. Consent was reviewed with the patient as well as the family and decision was discussed to proceed with left selective neck dissection if patient met clinical indications as far as size of tumor as well as depth of invasion. This was discussed at length with the patient and his wife. The patient was taken to the operating room and placed in the supine position. General endotracheal anesthesia was induced. The patient's oral cavity was prepped and draped in a sterile fashion and 8 mL of 1%  lidocaine 1:100,000 epinephrine were injected into his left lateral tongue. A measuring ruler was used to create an approximately 1 cm margin and tacking sutures were placed around this margin and using Bovie electrocautery a partial glossectomy was performed. Care was taken to stay on the outside of these marking sutures for a 1 cm margin. At this time hemostasis was achieved in the tongue bed. At the site of the partial glossectomy palpation was negative for evidence of any residual disease. This was sent for frozen section for evaluation of the size as well as depth of invasion. Frozen section margins were also taken of the deep anterior/superior, anterior/inferior, posterior/superior, and posterior/inferior margins. These were all hand delivered to pathology. All margins were negative, however, there was some close margins of the superior portion and, therefore, additional frozen section was made of the superior margin which was negative for any evidence of tumor. At this time once all the frozen sections were negative and the depth of invasion as well as the size was known, the deep Vicryl sutures were placed for reapproximation of the left lateral tongue deep musculature and the skin was closed in interrupted lateral horizontal mattress sutures as well as interrupted single sutures.   At this time, attention was directed to the patient's left neck. The patient was redraped and prepped in a sterile fashion. All instruments were discarded that were used in the oral cavity. 8 mL of 1% lidocaine 1:100,000 epinephrine was injected in the patient's left neck. A hockey stick incision was created along the anterior border of his SCM as well as anterior neck. Dissection was  carried down through the level of the platysma using Bovie electrocautery and anterior and posterior subplatysmal flaps were created using Bovie electrocautery. Care was taken to avoid injury of the marginal mandibular nerve and nerve stimulator  was used to ensure no damage occurred. At this time the left submandibular gland fascia was identified. Inferior aspect was entered and this was reflected. The facial vein was ligated and this was reflected with the submandibular gland fascia. The submandibular gland was circumferentially dissected using combination of blunt techniques as well as Harmonic scalpel and the facial artery as well as submandibular duct was ligated. The lingual nerve was identified and its ganglion attachment to the duct was ligated at the gland itself. The digastric muscle was identified. The omohyoid was identified and retracted which allowed further dissection and removal of the gland. The hypoglossal nerve was identified and protected. The left submandibular gland was passed off the table for permanent pathological evaluation of level 1B.   At this time, attention was directed to level 1A. A pocket was developed underneath the patient's submental region and an area of lymph tissue and adipose tissue was removed using Bovie electrocautery. Hemostasis was achieved.   Next, attention was directed to the patient's anterior cervical compartment. The cervical fascia overlying the sternocleidomastoid was divided along the anterior border of the sternocleidomastoid using Bovie electrocautery. Dissection was carried down anterior to the SCM using blunt techniques. The spinal accessory nerve was identified. This was traced inferiorly until its insertion into the SCM as well as superiorly until it dove underneath the internal jugular vein. At this time, level 2B was excised and lymph node packet was removed with combination of blunt techniques as well as Bovie electrocautery and Harmonic scalpel. Care was taken to avoid injury of the spinal accessory nerve as well as internal jugular vein.  At this time, attention was directed to levels 2A and 3. Anterior cervical fascia was continued to be peeled off the anterior SCM and the anterior  cervical rootlets were identified and these were preserved. Dissection was carried along the floor of the anterior compartment and the anterior jugular vein, carotid artery, and vagus nerve were identified and the lymph nodes with a combination of blunt techniques and Harmonic scalpel were separated from the anterior jugular vein, the carotid artery, and vagus. One large lymph node in level 2A was identified in this location. Care was taken to avoid any injury to the great vessels.   At this time, meticulous hemostasis was achieved. The patient's neck was copiously irrigated with sterile saline. A JP drain was placed on the posterior aspect of the superior vertical limb and this was placed within the wound bed and the platysma was closed in an interrupted fashion using 3-0 Vicryl sutures and the skin was closed in a running subcuticular fashion. Bacitracin ointment was placed. The patient tolerated the procedure well and was taken to PAC-U in good condition.   ____________________________ Jerene Bears, MD ccv:drc D: 08/13/2011 13:36:44 ET T: 08/13/2011 14:31:18 ET JOB#: [id number]  cc: Jerene Bears, MD, <Dictator> Jerene Bears MD ELECTRONICALLY SIGNED 08/20/2011 17:04

## 2014-09-05 NOTE — Consult Note (Signed)
Reason for Visit: This 49 year old Male patient presents to the clinic for initial evaluation of  Oral tongue cancer .   Referred by Dr.Vaught.  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 He underwent initial biopsy which was positive for squamous cell carcinoma by ENT. Patient underwent partial glossectomy for a 2.5 x 2.0 cm moderately differentiated squamous cell carcinoma. Margins were clear but close at 5 mm. Depth of invasion was 0.9 cm. There was no lymph vascular spread there was positive perineural spread. 22 nodes were at excised all negative for metastatic disease. He underwent a followup PET/CT scan which interestingly enough showed avid uptake in multiple mediastinal lymph nodes. There was also on CT scan some small pulmonary nodules. Patient has done fairly well postoperatively. He is swallowing well. He did lose some weight during his surgical recovery. His case was presented at our weekly tumor conference.  Past Hx:    Lower back problems:    Squamous cell carcinoma of tongue:    partial glossectomy with lymph node disection:   Past, Family and Social History:   Past Medical History noncontributory    Family History noncontributory    Social History noncontributory    Additional Past Medical and Surgical History Accompanied by wife and mother today   Allergies:   Penicillin: Unknown  All Mycins- Pt has as a child  and is unsure of reaction: Unknown  Sulfa drugs: Unknown  Home Meds:  Home Medications: Medication Instructions Status  Lidocaine Viscous 10 milliliter(s) mucous membrane swish and spit-as needed Active  Peridex milliliter(s) mucous membrane 3 times a day Active  bacitracin topical  topically 3 times a day, as instructed by md Active  Colace  orally otc, use as directed by md Active  Flonase 2  nasal once a day Active  Astelin 2 spray(s) nasal once a day (at bedtime) Active  oxycodone 5 mg/5 mL oral solution milliliter(s) orally every 4 hours as needed for pain Active   Review of Systems:   General negative    Performance Status (ECOG) 0    Skin negative    Breast negative    Ophthalmologic negative    ENMT see HPI    Respiratory and Thorax negative    Cardiovascular negative    Gastrointestinal negative    Genitourinary negative    Musculoskeletal negative    Neurological negative    Psychiatric negative    Hematology/Lymphatics negative    Endocrine negative    Allergic/Immunologic negative   Nursing Notes:  Nursing Vital Signs and Chemo Nursing Nursing Notes: *CC Vital Signs Flowsheet:   11-Apr-13 13:30   Pulse Pulse 77   Respirations Respirations 18   SBP SBP 113   DBP DBP 82   Pain Scale (0-10)  0   Current Weight (kg) (kg) 89.1   Height (cm) centimeters 185.4   BSA (m2) 2.1   Physical Exam:  General/Skin/HEENT:  General normal    Eyes normal    Additional PE Well-developed male in NAD. He is status post partial glossectomy. Tongue is healing well. No other oral mucosal lesions are identified. Indirect mirror examination shows upper airway clear base of tongue vallecula within normal limits. He is status post recent left neck dissection incision healing well. No other adenopathy in the neck is detected in the subdigastric cervical or supraclavicular region. Lungs are clear to A&P cardiac examination shows regular rate and rhythm.    Breasts/Resp/CV/GI/GU:   Respiratory and Thorax normal    Cardiovascular normal    Gastrointestinal normal    Genitourinary normal   MS/Neuro/Psych/Lymph:   Musculoskeletal normal    Neurological normal    Lymphatics normal   Other Results:  Radiology Results: CT:    25-Mar-13 11:21, CT Neck With Contrast   CT Neck With Contrast    REASON FOR EXAM:    TONGUE CA  COMMENTS:       PROCEDURE: KCT - KCT NECK WITH CONTRAST  - Aug 06 2011 11:21AM     RESULT: Indication: Tongue cancer    Comparison: None    Technique: Multiple sequential axial images from the apices of the lungs   to the level of the orbits obtained with 75 mL Isovue-370 IV contrast.    Findings:  There is soft tissue prominence at the base of the tongue   which is ill-defined and may reflect known tongue carcinoma. There no   pathologically enlarged cervical lymphnodes. There is no nasopharyngeal     or oropharyngeal mass. There is no focal fluid collection. The airway is   patent. The visualized portions of the carotid arteries and internal   jugular veins are patent.    The visualized portions of the brain demonstrate no focal abnormality.    There is mild right maxillary sinus mucosal thickening.     There is no lytic or blastic osseous lesion.    IMPRESSION:      There is soft tissue prominence at the base of the tongue which is   ill-defined and may reflect known tongue carcinoma. There is no   pathologic lymphadenopathy. There are a few scattered nonpathologically     enlarged cervical lymph nodes.          Verified By: Jennette Banker, M.D., MD  Nuclear Med:    27-Mar-13 10:00, PET/CT Scan Head/Neck CA Initial Staging   PET/CT Scan Head/Neck CA Initial Staging    REASON FOR EXAM:    TONGUE CA  COMMENTS:       PROCEDURE: PET - PET/CT INIT STAG HEAD/NECK CA  - Aug 08 2011 10:00AM     RESULT: Patient is undergoing initial staging of tongue malignancy. The   patient underwent  biopsy on 26 July 2011. The patient's fasting blood   glucose level was 101 mg/dL. The patient received 13.1 mCi of F-18   labeled FDG at 8:00 a.m. with scanning beginning at 9 2:00 a.m. Delayed   imaging was begun at 9:33 a.m. A noncontrast CT scan was performed at the   same sitting for coregistration and attenuation correction.    There is asymmetrically increased uptake of the radiopharmaceutical in   the left aspect of the tongue. This corresponds to fairly prominent soft   tissue density here slightly deforms the posterior aspect of the tongue.     The maximal SUV is 10.1. There is symmetrically increased uptake in the   tonsillar soft tissues. There is  mildly increased uptake within the left   submandibular gland. It exhibits an SUV of 3.7 maximally.    Within the thorax there is abnormal uptake within hilar lymph nodes   bilaterally and within a right paravertebral lymph node below the level   of the carina. The paravertebral lymph node exhibits a maximal SUV a 7.9.   The right hilar node exhibits a maximum SUV of 5 and the left hilar lymph   node maximal SUV of 4.7. Within the abdomen and pelvis normal expected   uptake is seen within the urinary tracts.    On delayed images over the head and neck uptake in the left aspect of the   tongue increases to 13.4 SUV units and uptake in the left submandibular   gland measures 4.1 SUV units.    At lung window settings I see no discrete pulmonary parenchymal masses or   nodules. There is compressive atelectasis in the posterior aspects of   both lungs. There may be asubcentimeter nodule in the anterior aspect of   the right upper lobe on image 186 but this lies adjacent to branching   vessels. Within the abdomen I see no abnormality of the liver or spleen   or gallbladder. No adrenal masses are demonstrated.    IMPRESSION:   1. There is abnormally increased uptake the base of the tongue on the   left, in the left salivary gland, as  well as within mediastinal and hilar   lymph nodes.  2. I do not suspicious see abnormal uptake below the hemidiaphragms.          Verified By: DAVID A. Martinique, M.D., MD   Assessment and Plan:  Impression:   at least stage I squamous cell carcinoma left oral tongue status post resection and lymph node dissection in 49 year old male. Workup for avid uptake on PET scan in the mediastinum is underway.  Plan:   patient's case was discussed our weekly tumor conference. I have also discuss the case personally with Dr. Faith Rogue. He is trying to arrange endoscopic ultrasound for needle biopsy of the avid mediastinal uptake. If this turns out to be a squamous cell carcinoma would treat with systemic chemotherapy plus radiation to his chest. Should this be negative would concentrate on his tongue and treat up to 6400 cGy using IMRT treatment planning and delivery to spare salivary glands as much as possible. Would use radiation to sterilize any microscopic residual disease that might be present. Close margins and perineural invasion have been showed to be poor prognostic indicators in this type of disease and at to the value of adjuvant radiation therapy. All this was explained in detail to the patient and his family. Dr. Faith Rogue will be arranging mediastinal evaluation. We will followup with the patient at that time. Patient also has some several teeth that are causing him pain and I set up a consultation with Edwinna Areola for consultation.  I would like to take this opportunity to thank you for allowing me to continue to participate in this patient's care.  CC Referral:   cc: Dr. Pryor Ochoa   Electronic Signatures: Baruch Gouty, Roda Shutters (MD)  (Signed 06-May-13 09:52)  Authored: HPI, Diagnosis, Past Hx, PFSH, Allergies, Home Meds, ROS, Nursing Notes, Physical Exam, Other Results, Encounter Assessment and Plan, CC Referring Physician   Last Updated: 06-May-13 09:52 by Armstead Peaks (MD)

## 2014-09-06 LAB — CBC CANCER CENTER
BASOS ABS: 0 x10 3/mm (ref 0.0–0.1)
Basophil %: 0 %
EOS ABS: 0 x10 3/mm (ref 0.0–0.7)
Eosinophil %: 0.3 %
HCT: 35.2 % — ABNORMAL LOW (ref 40.0–52.0)
HGB: 11.3 g/dL — ABNORMAL LOW (ref 13.0–18.0)
LYMPHS PCT: 2 %
Lymphocyte #: 0.2 x10 3/mm — ABNORMAL LOW (ref 1.0–3.6)
MCH: 28 pg (ref 26.0–34.0)
MCHC: 32.2 g/dL (ref 32.0–36.0)
MCV: 87 fL (ref 80–100)
MONO ABS: 0.2 x10 3/mm (ref 0.2–1.0)
Monocyte %: 1.9 %
Neutrophil #: 9.3 x10 3/mm — ABNORMAL HIGH (ref 1.4–6.5)
Neutrophil %: 95.8 %
Platelet: 308 x10 3/mm (ref 150–440)
RBC: 4.06 10*6/uL — ABNORMAL LOW (ref 4.40–5.90)
RDW: 21.2 % — ABNORMAL HIGH (ref 11.5–14.5)
WBC: 9.7 x10 3/mm (ref 3.8–10.6)

## 2014-09-06 LAB — BASIC METABOLIC PANEL
Anion Gap: 7 (ref 7–16)
BUN: 12 mg/dL
CHLORIDE: 96 mmol/L — AB
CREATININE: 0.83 mg/dL
Calcium, Total: 8.8 mg/dL — ABNORMAL LOW
Co2: 28 mmol/L
Glucose: 186 mg/dL — ABNORMAL HIGH
Potassium: 3.7 mmol/L
Sodium: 131 mmol/L — ABNORMAL LOW

## 2014-09-12 NOTE — Op Note (Signed)
PATIENT NAME:  Corey Sims, Corey Sims MR#:  [id number] DATE OF BIRTH:  12-Feb-1966  DATE OF PROCEDURE:  06/09/2014  PREOPERATIVE DIAGNOSES: 1.  Possible infection of right jugular Port-A-Cath.  2.  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  3.  Squamous cell carcinoma with nodal metastases.   POSTOPERATIVE DIAGNOSES: 1.  Possible infection of right jugular Port-A-Cath.  2.  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  3.  Squamous cell carcinoma with nodal metastases.   PROCEDURES: 1.  Removal of right jugular Port-A-Cath.  2.  Ultrasound guidance for vascular access to the left basilic vein.  3.  Left upper extremity venogram and central venogram.  4.  Catheter placement into superior vena cava.  5.  Catheter-directed thrombolysis with 8 mg of t-PA to the left basilic vein, axillary vein, subclavian vein, and innominate vein.  6.  Catheter thrombectomy to the left basilic vein, axillary vein, subclavian vein, and innominate vein.  7.  Percutaneous transluminal angioplasty of left basilic and axillary vein with 6 mm diameter angioplasty balloon.  8.  Percutaneous transluminal angioplasty of axillary, subclavian, and innominate veins with 8 mm diameter angioplasty balloon.  9.  Percutaneous transluminal angioplasty of subclavian and innominate vein with 10 and 12 mm diameter angioplasty balloon.   SURGEON:  Algernon Huxley, MD   ANESTHESIA:  Local with moderate conscious sedation.   ESTIMATED BLOOD LOSS:  25 mL.   INDICATION FOR PROCEDURE:  This is a 49 year old male with extensive malignant disease. He has extensive left upper extremity DVT and central venous DVT with marked left arm swelling. There is also redness and erythema overlying his right jugular Port-A-Cath that has been in for over a year. On discussions with his oncologist, removal of the port is requested due to the fear of  infection, and any percutaneous intervention that could be performed to improve his symptoms of the left upper extremity is requested as well. Risks and benefits are discussed. Informed consent is obtained.   DESCRIPTION OF PROCEDURE:  The patient is brought to the vascular suite. I initially started on his left upper extremity. I accessed the basilic vein just below the antecubital fossa with a micropuncture needle under direct ultrasound guidance, and a permanent image was recorded. A micropuncture wire and sheath were placed. Imaging through this showed thrombosis of the basilic vein in the distal upper arm and essentially no good flow was seen due to extensive thrombosis of the cephalic vein near the shoulder and the basilic vein. I was able to advance a wire and catheter, put a 6-French sheath in, and give the patient 3000 units of intravenous heparin. I traversed through the basilic vein and into the axillary vein, where imaging was performed, which showed complete thrombosis of the axillary and subclavian veins and the innominate vein. With a Kumpe catheter and the Advantage wire, I was able to cross this occlusion and confirm intraluminal flow in the superior vena cava. I then replaced the wire, and 8 mg of t-PA were instilled starting in the basilic vein, throughout the upper arm, the axillary vein, the subclavian vein, and into the innominate vein. This was allowed to dwell for approximately 15 minutes. Due to a malfunction with the AngioJet device, we used a typical catheter aspiration with a 6-French multipurpose catheter to macerate the clot and suction aspirate as much as possible. This did result in some improvement but not resolution. 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 The basilic and axillary veins were treated with a 6 mm diameter angioplasty balloon from the distal  upper arm to the axillary vein. The more central veins were treated initially with an 8 mm diameter angioplasty balloon in the innominate vein and subclavian vein and back to the axillary vein. There was still occlusion at this point, and we upsized to a 10 mm diameter angioplasty balloon in the axillary, subclavian, and innominate veins and then ultimately with a 12 mm balloon at the innominate and subclavian junction and into the subclavian vein. This resulted in a channel with only partial residual thrombosis in the subclavian and innominate vein with markedly improved flow. It also appeared that his arm swelling was improved. At this point, the sheath and wire were removed. Pressure was held. I turned my attention to his Port-A-Cath. The area was anesthetized with 1% lidocaine. His previous incision was reopened. The port was then removed without difficulty including the catheter in its entirety, and the wound was then closed with a 3-0 Vicryl and a 4-0 Monocryl and Dermabond was placed as a dressing. The patient was then taken to the recovery room in stable condition having tolerated the procedure well.    ____________________________ Algernon Huxley, MD jsd:nb D: 06/09/2014 15:46:57 ET T: 06/09/2014 23:52:47 ET JOB#: [id number]  cc: Algernon Huxley, MD, <Dictator> Martie Lee. Oliva Bustard, MD Algernon Huxley MD ELECTRONICALLY SIGNED 06/16/2014 11:53

## 2014-09-13 ENCOUNTER — Telehealth: Payer: Self-pay | Admitting: *Deleted

## 2014-09-13 DIAGNOSIS — C760 Malignant neoplasm of head, face and neck: Secondary | ICD-10-CM

## 2014-09-13 MED ORDER — FENTANYL 100 MCG/HR TD PT72: 100.0000 ug | MEDICATED_PATCH | TRANSDERMAL | Status: DC

## 2014-09-13 MED ORDER — OXYCODONE HCL 15 MG PO TABS: 15.0000 mg | ORAL_TABLET | Freq: Four times a day (QID) | ORAL | Status: DC | PRN

## 2014-09-13 MED ORDER — FENTANYL 75 MCG/HR TD PT72: 75.0000 ug | MEDICATED_PATCH | TRANSDERMAL | Status: DC

## 2014-09-13 NOTE — Telephone Encounter (Signed)
Pt would like to pick up prescription at 2pm today when comes for treatment

## 2014-09-14 ENCOUNTER — Other Ambulatory Visit: Payer: BC Managed Care – PPO

## 2014-09-14 ENCOUNTER — Inpatient Hospital Stay: Payer: BC Managed Care – PPO

## 2014-09-14 ENCOUNTER — Inpatient Hospital Stay: Payer: BC Managed Care – PPO | Attending: Oncology | Admitting: *Deleted

## 2014-09-14 ENCOUNTER — Other Ambulatory Visit: Payer: Self-pay | Admitting: Family Medicine

## 2014-09-14 ENCOUNTER — Other Ambulatory Visit: Payer: Self-pay | Admitting: Oncology

## 2014-09-14 VITALS — BP 100/60 | HR 80 | Temp 98.3°F | Resp 16

## 2014-09-14 DIAGNOSIS — R0602 Shortness of breath: Secondary | ICD-10-CM | POA: Insufficient documentation

## 2014-09-14 DIAGNOSIS — Z9221 Personal history of antineoplastic chemotherapy: Secondary | ICD-10-CM | POA: Diagnosis not present

## 2014-09-14 DIAGNOSIS — R531 Weakness: Secondary | ICD-10-CM | POA: Diagnosis not present

## 2014-09-14 DIAGNOSIS — Z86718 Personal history of other venous thrombosis and embolism: Secondary | ICD-10-CM | POA: Insufficient documentation

## 2014-09-14 DIAGNOSIS — C029 Malignant neoplasm of tongue, unspecified: Secondary | ICD-10-CM | POA: Diagnosis present

## 2014-09-14 DIAGNOSIS — D630 Anemia in neoplastic disease: Secondary | ICD-10-CM | POA: Diagnosis not present

## 2014-09-14 DIAGNOSIS — Z85810 Personal history of malignant neoplasm of tongue: Secondary | ICD-10-CM | POA: Insufficient documentation

## 2014-09-14 DIAGNOSIS — Z923 Personal history of irradiation: Secondary | ICD-10-CM | POA: Diagnosis not present

## 2014-09-14 DIAGNOSIS — C021 Malignant neoplasm of border of tongue: Secondary | ICD-10-CM

## 2014-09-14 DIAGNOSIS — C770 Secondary and unspecified malignant neoplasm of lymph nodes of head, face and neck: Secondary | ICD-10-CM | POA: Insufficient documentation

## 2014-09-14 DIAGNOSIS — R5383 Other fatigue: Secondary | ICD-10-CM | POA: Insufficient documentation

## 2014-09-14 DIAGNOSIS — Z7901 Long term (current) use of anticoagulants: Secondary | ICD-10-CM | POA: Insufficient documentation

## 2014-09-14 DIAGNOSIS — G893 Neoplasm related pain (acute) (chronic): Secondary | ICD-10-CM | POA: Diagnosis not present

## 2014-09-14 DIAGNOSIS — Z5111 Encounter for antineoplastic chemotherapy: Secondary | ICD-10-CM | POA: Insufficient documentation

## 2014-09-14 DIAGNOSIS — G629 Polyneuropathy, unspecified: Secondary | ICD-10-CM | POA: Diagnosis not present

## 2014-09-14 DIAGNOSIS — C7951 Secondary malignant neoplasm of bone: Secondary | ICD-10-CM | POA: Diagnosis not present

## 2014-09-14 LAB — COMPREHENSIVE METABOLIC PANEL
ALT: 108 U/L — AB (ref 17–63)
ANION GAP: 6 (ref 5–15)
AST: 38 U/L (ref 15–41)
Albumin: 2.6 g/dL — ABNORMAL LOW (ref 3.5–5.0)
Alkaline Phosphatase: 156 U/L — ABNORMAL HIGH (ref 38–126)
BUN: 9 mg/dL (ref 6–20)
CALCIUM: 8.6 mg/dL — AB (ref 8.9–10.3)
CO2: 27 mmol/L (ref 22–32)
Chloride: 97 mmol/L — ABNORMAL LOW (ref 101–111)
Creatinine, Ser: 0.72 mg/dL (ref 0.61–1.24)
GFR calc non Af Amer: >60 mL/min (ref >60)
Glucose, Bld: 162 mg/dL — ABNORMAL HIGH (ref 65–99)
Potassium: 3.5 mmol/L (ref 3.5–5.1)
SODIUM: 130 mmol/L — AB (ref 135–145)
TOTAL PROTEIN: 5.9 g/dL — AB (ref 6.5–8.1)
Total Bilirubin: 0.3 mg/dL (ref 0.3–1.2)

## 2014-09-14 LAB — CBC WITH DIFFERENTIAL/PLATELET
Basophils Absolute: 0 10*3/uL (ref 0–0.1)
Basophils Relative: 0 %
Eosinophils Absolute: 0 10*3/uL (ref 0–0.7)
HEMATOCRIT: 34.8 % — AB (ref 40.0–52.0)
Hemoglobin: 10.9 g/dL — ABNORMAL LOW (ref 13.0–18.0)
LYMPHS ABS: 0.1 10*3/uL — AB (ref 1.0–3.6)
MCH: 27.7 pg (ref 26.0–34.0)
MCHC: 31.3 g/dL — ABNORMAL LOW (ref 32.0–36.0)
MCV: 88.6 fL (ref 80.0–100.0)
Monocytes Absolute: 0.2 10*3/uL (ref 0.2–1.0)
Monocytes Relative: 2 %
Neutro Abs: 11.2 10*3/uL — ABNORMAL HIGH (ref 1.4–6.5)
Neutrophils Relative %: 97 %
Platelets: 193 10*3/uL (ref 150–440)
RBC: 3.92 MIL/uL — ABNORMAL LOW (ref 4.40–5.90)
RDW: 21.7 % — AB (ref 11.5–14.5)
WBC: 11.6 10*3/uL — ABNORMAL HIGH (ref 3.8–10.6)

## 2014-09-14 MED ORDER — SODIUM CHLORIDE 0.9 % IV SOLN
Freq: Once | INTRAVENOUS | Status: AC
  Administered 2014-09-14: 16:00:00 via INTRAVENOUS
  Filled 2014-09-14: qty 4

## 2014-09-14 MED ORDER — METHOTREXATE SODIUM (PF) CHEMO INJECTION 250 MG/10ML
30.0000 mg/m2 | Freq: Once | INTRAMUSCULAR | Status: AC
  Administered 2014-09-14: 65 mg via INTRAVENOUS
  Filled 2014-09-14: qty 2.6

## 2014-09-14 MED ORDER — PROCHLORPERAZINE MALEATE 10 MG PO TABS: 10.0000 mg | ORAL_TABLET | Freq: Once | ORAL | Status: DC

## 2014-09-15 ENCOUNTER — Other Ambulatory Visit: Payer: Self-pay | Admitting: *Deleted

## 2014-09-15 DIAGNOSIS — C021 Malignant neoplasm of border of tongue: Secondary | ICD-10-CM

## 2014-09-20 ENCOUNTER — Telehealth: Payer: Self-pay | Admitting: *Deleted

## 2014-09-20 ENCOUNTER — Other Ambulatory Visit: Payer: Self-pay | Admitting: *Deleted

## 2014-09-20 MED ORDER — DEXAMETHASONE 4 MG PO TABS: 4.0000 mg | ORAL_TABLET | Freq: Every day | ORAL | Status: DC

## 2014-09-20 NOTE — Telephone Encounter (Signed)
Will continue patient on decadron until next appt. Will reduce dose to 4mg  daily instead of 8mg . Rx to be escribed to pharmacy.

## 2014-09-20 NOTE — Telephone Encounter (Signed)
Called patient back to inform him the medication is to be continued, however, the dose has been reduced and the new prescription has been e-scribed to his pharmacy.

## 2014-09-23 ENCOUNTER — Telehealth: Payer: Self-pay | Admitting: *Deleted

## 2014-09-23 MED ORDER — CITALOPRAM HYDROBROMIDE 20 MG PO TABS: 20.0000 mg | ORAL_TABLET | Freq: Every day | ORAL | Status: DC

## 2014-09-23 NOTE — Telephone Encounter (Signed)
Refill Citalopram

## 2014-09-24 ENCOUNTER — Telehealth: Payer: Self-pay | Admitting: *Deleted

## 2014-09-24 ENCOUNTER — Other Ambulatory Visit: Payer: Self-pay | Admitting: Oncology

## 2014-09-24 ENCOUNTER — Other Ambulatory Visit: Payer: Self-pay | Admitting: *Deleted

## 2014-09-24 ENCOUNTER — Encounter: Payer: Self-pay | Admitting: *Deleted

## 2014-09-24 DIAGNOSIS — C760 Malignant neoplasm of head, face and neck: Secondary | ICD-10-CM

## 2014-09-24 MED ORDER — CITALOPRAM HYDROBROMIDE 20 MG PO TABS: 20.0000 mg | ORAL_TABLET | Freq: Every day | ORAL | Status: DC

## 2014-09-24 NOTE — Telephone Encounter (Signed)
Informed patient that will call in refill for citalopram and that appointment is at [facility] on Monday 5/16 and will see wound care nurse on the same day at [facility] pt to callback if has any further questions.

## 2014-09-25 ENCOUNTER — Encounter: Payer: Self-pay | Admitting: Emergency Medicine

## 2014-09-25 ENCOUNTER — Emergency Department: Payer: BC Managed Care – PPO

## 2014-09-25 ENCOUNTER — Observation Stay
Admission: EM | Admit: 2014-09-25 | Discharge: 2014-09-26 | Disposition: A | Discharge status: Home / Self Care | Payer: BC Managed Care – PPO | Attending: Internal Medicine | Admitting: Internal Medicine

## 2014-09-25 DIAGNOSIS — E871 Hypo-osmolality and hyponatremia: Secondary | ICD-10-CM

## 2014-09-25 DIAGNOSIS — Z9221 Personal history of antineoplastic chemotherapy: Secondary | ICD-10-CM | POA: Diagnosis not present

## 2014-09-25 DIAGNOSIS — M6281 Muscle weakness (generalized): Secondary | ICD-10-CM | POA: Diagnosis not present

## 2014-09-25 DIAGNOSIS — Z8042 Family history of malignant neoplasm of prostate: Secondary | ICD-10-CM | POA: Insufficient documentation

## 2014-09-25 DIAGNOSIS — M79604 Pain in right leg: Secondary | ICD-10-CM | POA: Insufficient documentation

## 2014-09-25 DIAGNOSIS — Z882 Allergy status to sulfonamides status: Secondary | ICD-10-CM | POA: Diagnosis not present

## 2014-09-25 DIAGNOSIS — R29898 Other symptoms and signs involving the musculoskeletal system: Secondary | ICD-10-CM | POA: Diagnosis present

## 2014-09-25 DIAGNOSIS — Z9049 Acquired absence of other specified parts of digestive tract: Secondary | ICD-10-CM | POA: Diagnosis not present

## 2014-09-25 DIAGNOSIS — Z880 Allergy status to penicillin: Secondary | ICD-10-CM | POA: Diagnosis not present

## 2014-09-25 DIAGNOSIS — M79605 Pain in left leg: Secondary | ICD-10-CM | POA: Insufficient documentation

## 2014-09-25 DIAGNOSIS — M25561 Pain in right knee: Secondary | ICD-10-CM | POA: Insufficient documentation

## 2014-09-25 DIAGNOSIS — M25562 Pain in left knee: Secondary | ICD-10-CM | POA: Insufficient documentation

## 2014-09-25 DIAGNOSIS — Z86718 Personal history of other venous thrombosis and embolism: Secondary | ICD-10-CM | POA: Diagnosis not present

## 2014-09-25 DIAGNOSIS — C021 Malignant neoplasm of border of tongue: Secondary | ICD-10-CM | POA: Insufficient documentation

## 2014-09-25 DIAGNOSIS — Z79899 Other long term (current) drug therapy: Secondary | ICD-10-CM | POA: Insufficient documentation

## 2014-09-25 DIAGNOSIS — R52 Pain, unspecified: Secondary | ICD-10-CM | POA: Insufficient documentation

## 2014-09-25 DIAGNOSIS — Z881 Allergy status to other antibiotic agents status: Secondary | ICD-10-CM | POA: Insufficient documentation

## 2014-09-25 DIAGNOSIS — R748 Abnormal levels of other serum enzymes: Secondary | ICD-10-CM | POA: Diagnosis not present

## 2014-09-25 DIAGNOSIS — M79606 Pain in leg, unspecified: Secondary | ICD-10-CM | POA: Diagnosis present

## 2014-09-25 DIAGNOSIS — IMO0002 Reserved for concepts with insufficient information to code with codable children: Secondary | ICD-10-CM

## 2014-09-25 LAB — URINALYSIS COMPLETE WITH MICROSCOPIC (ARMC ONLY)
BACTERIA UA: NONE SEEN
Bilirubin Urine: NEGATIVE
GLUCOSE, UA: NEGATIVE mg/dL
Ketones, ur: NEGATIVE mg/dL
Leukocytes, UA: NEGATIVE
Nitrite: NEGATIVE
Protein, ur: 30 mg/dL — AB
Specific Gravity, Urine: 1.006 (ref 1.005–1.030)
Squamous Epithelial / LPF: NONE SEEN
pH: 8 (ref 5.0–8.0)

## 2014-09-25 LAB — CBC WITH DIFFERENTIAL/PLATELET
Basophils Absolute: 0 10*3/uL (ref 0–0.1)
Basophils Relative: 0 %
Eosinophils Absolute: 0 10*3/uL (ref 0–0.7)
Eosinophils Relative: 0 %
HCT: 36.1 % — ABNORMAL LOW (ref 40.0–52.0)
Hemoglobin: 11.6 g/dL — ABNORMAL LOW (ref 13.0–18.0)
Lymphocytes Relative: 2 %
Lymphs Abs: 0.1 10*3/uL — ABNORMAL LOW (ref 1.0–3.6)
MCH: 29.2 pg (ref 26.0–34.0)
MCHC: 32.3 g/dL (ref 32.0–36.0)
MCV: 90.4 fL (ref 80.0–100.0)
Monocytes Absolute: 0.3 10*3/uL (ref 0.2–1.0)
Monocytes Relative: 3 %
NEUTROS ABS: 7.7 10*3/uL — AB (ref 1.4–6.5)
Neutrophils Relative %: 95 %
Platelets: 302 10*3/uL (ref 150–440)
RBC: 3.99 MIL/uL — ABNORMAL LOW (ref 4.40–5.90)
RDW: 24.4 % — ABNORMAL HIGH (ref 11.5–14.5)
WBC: 8.1 10*3/uL (ref 3.8–10.6)

## 2014-09-25 LAB — TROPONIN I

## 2014-09-25 LAB — COMPREHENSIVE METABOLIC PANEL
ALK PHOS: 165 U/L — AB (ref 38–126)
ALT: 83 U/L — AB (ref 17–63)
AST: 18 U/L (ref 15–41)
Albumin: 2.2 g/dL — ABNORMAL LOW (ref 3.5–5.0)
Anion gap: 7 (ref 5–15)
BILIRUBIN TOTAL: 0.4 mg/dL (ref 0.3–1.2)
BUN: 9 mg/dL (ref 6–20)
CO2: 29 mmol/L (ref 22–32)
Calcium: 8.3 mg/dL — ABNORMAL LOW (ref 8.9–10.3)
Chloride: 92 mmol/L — ABNORMAL LOW (ref 101–111)
Creatinine, Ser: 0.72 mg/dL (ref 0.61–1.24)
GFR calc Af Amer: >60 mL/min (ref >60)
GFR calc non Af Amer: >60 mL/min (ref >60)
Glucose, Bld: 166 mg/dL — ABNORMAL HIGH (ref 65–99)
POTASSIUM: 4.2 mmol/L (ref 3.5–5.1)
Sodium: 128 mmol/L — ABNORMAL LOW (ref 135–145)
TOTAL PROTEIN: 5.7 g/dL — AB (ref 6.5–8.1)

## 2014-09-25 LAB — PROTIME-INR
INR: 1.08
Prothrombin Time: 14.2 seconds (ref 11.4–15.0)

## 2014-09-25 MED ORDER — HYDROMORPHONE HCL 1 MG/ML IJ SOLN
INTRAMUSCULAR | Status: AC
  Administered 2014-09-26: 02:00:00
  Filled 2014-09-25: qty 1

## 2014-09-25 MED ORDER — SODIUM CHLORIDE 0.9 % IV SOLN
Freq: Once | INTRAVENOUS | Status: AC
  Administered 2014-09-25: 20:00:00 via INTRAVENOUS

## 2014-09-25 MED ORDER — HYDROMORPHONE HCL 1 MG/ML IJ SOLN
INTRAMUSCULAR | Status: AC
  Administered 2014-09-25: 1 mg
  Filled 2014-09-25: qty 1

## 2014-09-25 MED ORDER — DEXTROSE-NACL 5-0.9 % IV SOLN: INTRAVENOUS | Status: DC

## 2014-09-25 NOTE — ED Notes (Signed)
Patient brought in by ems from home with complaint of bilateral knee pain that started yesterday. Patient unable to bear weight today. Patient states that the pain became worse today. Patient has head and neck cancer.

## 2014-09-25 NOTE — ED Provider Notes (Addendum)
[facility] Emergency Department Provider Note     Time seen: 1  I have reviewed the triage vital signs and the nursing notes.   HISTORY  Chief Complaint No chief complaint on file.    HPI Corey Sims is a 49 y.o. male urgency ER for increasing pain. Patient also has increasing weakness, to the point where he cannot stand any longer. 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 He is undergoing some type of chemotherapy currently, previous chest for the got infected and was removed, previously had DVT in his left upper extremity. Pain is severe in the chest and also in bilateral knees. He has not normally had pain in his legs. Up until today he can walk normally     Past Medical History  Diagnosis Date  . Allergy   . Cancer     tongue and neck    Patient Active Problem List   Diagnosis Date Noted  . 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 Excision of tongue lesion with laser  08/13/11  . Neck dissection Bilateral 08/13/11    Current Outpatient Rx  Name  Route  Sig  Dispense  Refill  . ALPRAZolam (XANAX) 0.25 MG tablet   Oral   Take 1 tablet (0.25 mg total) by mouth 2 (two) times daily as needed for sleep or anxiety.   30 tablet   2   . chlorhexidine (PERIDEX) 0.12 % solution   Mouth/Throat   Use as directed 15 mLs in the mouth or throat 2 (two) times daily.         . citalopram (CELEXA) 20 MG tablet   Oral   Take 1 tablet (20 mg total) by mouth at bedtime.   30 tablet   2   . dexamethasone (DECADRON) 4 MG tablet   Oral   Take 1 tablet (4 mg total) by mouth daily.   30 tablet   0   . fentaNYL (DURAGESIC - DOSED MCG/HR) 100 MCG/HR   Transdermal   Place 1 patch (100 mcg total) onto the skin every other day.   15 patch   0   . fentaNYL (DURAGESIC - DOSED MCG/HR) 75 MCG/HR   Transdermal   Place 1 patch (75 mcg total) onto the skin every other  day.   15 patch   0   . oxyCODONE (ROXICODONE) 15 MG immediate release tablet   Oral   Take 1-2 tablets (15-30 mg total) by mouth every 6 (six) hours as needed for pain.   120 tablet   0     Allergies Azithromycin; Penicillins; and Sulfa antibiotics  Family History  Problem Relation Age of Onset  . Cancer Father 23    prostate     Social History History  Substance Use Topics  . Smoking status: Never Smoker   . Smokeless tobacco: Not on file  . Alcohol Use: Yes     Comment: very limited    Review of Systems Constitutional: Negative for fever, decreased by mouth intake Eyes: Negative for visual changes. ENT: Negative for sore throat. Cardiovascular: Positive for left upper chest pain Respiratory: Negative for shortness of breath. Gastrointestinal: Negative for abdominal pain, vomiting and diarrhea. Genitourinary: Negative for dysuria. Musculoskeletal: Negative for back pain, positive for bilateral knee pain Skin: Negative for rash. Neurological: Negative for headaches, positive for increased weakness  10-point ROS otherwise negative.  ____________________________________________   PHYSICAL EXAM:  VITAL SIGNS: ED Triage Vitals  Enc Vitals Group     BP --      Pulse --      Resp --      Temp --      Temp src --      SpO2 --      Weight --      Height --      Head Cir --      Peak Flow --      Pain Score --      Pain Loc --      Pain Edu? --      Excl. in [place]? --     Constitutional: Alert and oriented. Cachectic in mild distress Eyes: Conjunctivae are normal. PERRL. Normal extraocular movements. ENT   Head: Normocephalic and atraumatic.   Nose: No congestion/rhinnorhea.   Mouth/Throat: Mucous membranes are moist.   Neck: No stridor. Cardiovascular: Normal rate, regular rhythm. Normal and symmetric distal pulses are present in all extremities. No murmurs, rubs, or gallops. Respiratory: Normal respiratory effort without tachypnea nor  retractions. Breath sounds are clear and equal bilaterally. No wheezes/rales/rhonchi. Gastrointestinal: Soft and nontender. No distention. No abdominal bruits. There is no CVA tenderness. Musculoskeletal: Nontender with normal range of motion in all extremities. No joint effusions.  No lower extremity tenderness nor edema. Neurologic:  Normal speech and language. No gross focal neurologic deficits are appreciated. Speech is normal. No gait instability. Skin:  Wounds and dressings are noted to the left upper chest wall and clavicular area Psychiatric: Speech and behavior are normal. Patient exhibits appropriate insight and judgment.  ____________________________________________  EKG: EKG with normal sinus rhythm normal axis normal intervals no evidence of hypertrophy likely inferior Q waves  LABS (pertinent positives/negatives)  Labs Reviewed  CBC WITH DIFFERENTIAL/PLATELET - Abnormal; Notable for the following:    RBC 3.99 (*)    Hemoglobin 11.6 (*)    HCT 36.1 (*)    RDW 24.4 (*)    Neutro Abs 7.7 (*)    Lymphs Abs 0.1 (*)    All other components within normal limits  COMPREHENSIVE METABOLIC PANEL - Abnormal; Notable for the following:    Sodium 128 (*)    Chloride 92 (*)    Glucose, Bld 166 (*)    Calcium 8.3 (*)    Total Protein 5.7 (*)    Albumin 2.2 (*)    ALT 83 (*)    Alkaline Phosphatase 165 (*)    All other components within normal limits  PROTIME-INR  TROPONIN I  URINALYSIS COMPLETEWITH MICROSCOPIC ([facility])   CBG MONITORING, ED    ____________________________________________  ED COURSE:  Pertinent labs & imaging results that were available during my care of the patient were reviewed by me and considered in my medical decision making (see chart for details).  We give patient IV fluids here and check basic labs. Also given IV Dilaudid for pain. Patient was unable to stand due to severe pain, and weakness he's been having. He is been near his baseline blood pressure  which is around 80 systolic. ____________________________________________   RADIOLOGY  FINDINGS: Shallow inspiration. Linear atelectasis in the left lung base. Heart size and pulmonary vascularity are normal for technique. No focal airspace disease or consolidation in the lungs. Somewhat a amorphous shadows are projected over the left upper chest, likely corresponding to the chest wall mass seen on previous CT. Surgical clips in the left side of the neck.  IMPRESSION: No active cardiopulmonary disease.  ____________________________________________    FINAL ASSESSMENT AND PLAN  Weakness and chronic pain due to cancer, hyponatremia Plan: Patient is unable to stand or walk, due to weakness and/or pain secondary to cancer. He will need admission IV pain control and physical therapy and possible skilled rehabilitation placement.    Earleen Newport, MD   Earleen Newport, MD 09/25/14 [place], MD 09/25/14 [phone]

## 2014-09-26 DIAGNOSIS — M79606 Pain in leg, unspecified: Secondary | ICD-10-CM | POA: Diagnosis present

## 2014-09-26 LAB — BASIC METABOLIC PANEL
Anion gap: 6 (ref 5–15)
BUN: 10 mg/dL (ref 6–20)
CALCIUM: 8.5 mg/dL — AB (ref 8.9–10.3)
CO2: 27 mmol/L (ref 22–32)
Chloride: 102 mmol/L (ref 101–111)
Creatinine, Ser: 0.62 mg/dL (ref 0.61–1.24)
GFR calc Af Amer: >60 mL/min (ref >60)
GFR calc non Af Amer: >60 mL/min (ref >60)
Glucose, Bld: 121 mg/dL — ABNORMAL HIGH (ref 65–99)
POTASSIUM: 4.5 mmol/L (ref 3.5–5.1)
SODIUM: 135 mmol/L (ref 135–145)

## 2014-09-26 MED ORDER — AZELASTINE HCL 0.1 % NA SOLN
1.0000 | Freq: Two times a day (BID) | NASAL | Status: DC | PRN
  Filled 2014-09-26: qty 30

## 2014-09-26 MED ORDER — ALPRAZOLAM 0.25 MG PO TABS: 0.2500 mg | ORAL_TABLET | Freq: Two times a day (BID) | ORAL | Status: DC | PRN

## 2014-09-26 MED ORDER — PANTOPRAZOLE SODIUM 40 MG PO TBEC
40.0000 mg | DELAYED_RELEASE_TABLET | Freq: Every day | ORAL | Status: DC
  Administered 2014-09-26: 09:00:00 40 mg via ORAL
  Filled 2014-09-26: qty 1

## 2014-09-26 MED ORDER — OXYCODONE HCL 5 MG PO TABS
15.0000 mg | ORAL_TABLET | Freq: Four times a day (QID) | ORAL | Status: DC | PRN
Start: 2014-09-26 — End: 2014-09-26

## 2014-09-26 MED ORDER — OXYCODONE HCL 5 MG PO TABS
15.0000 mg | ORAL_TABLET | Freq: Four times a day (QID) | ORAL | Status: DC | PRN
  Administered 2014-09-26 (×2): 15 mg via ORAL
  Filled 2014-09-26: qty 6

## 2014-09-26 MED ORDER — DOXYCYCLINE HYCLATE 100 MG PO TABS
100.0000 mg | ORAL_TABLET | Freq: Two times a day (BID) | ORAL | Status: DC
  Filled 2014-09-26: qty 1

## 2014-09-26 MED ORDER — DEXAMETHASONE 4 MG PO TABS
8.0000 mg | ORAL_TABLET | Freq: Every day | ORAL | Status: DC
  Administered 2014-09-26: 09:00:00 8 mg via ORAL

## 2014-09-26 MED ORDER — CITALOPRAM HYDROBROMIDE 20 MG PO TABS: 20.0000 mg | ORAL_TABLET | Freq: Every day | ORAL | Status: DC

## 2014-09-26 MED ORDER — OXYCODONE HCL 5 MG PO TABS
ORAL_TABLET | ORAL | Status: AC
  Administered 2014-09-26: 30 mg
  Filled 2014-09-26: qty 6

## 2014-09-26 MED ORDER — ENOXAPARIN SODIUM 80 MG/0.8ML ~~LOC~~ SOLN
80.0000 mg | Freq: Two times a day (BID) | SUBCUTANEOUS | Status: DC
  Administered 2014-09-26: 80 mg via SUBCUTANEOUS
  Filled 2014-09-26: qty 0.8

## 2014-09-26 MED ORDER — ACETAMINOPHEN 650 MG RE SUPP: 650.0000 mg | Freq: Four times a day (QID) | RECTAL | Status: DC | PRN

## 2014-09-26 MED ORDER — DEXAMETHASONE 4 MG PO TABS: 4.0000 mg | ORAL_TABLET | Freq: Every day | ORAL | Status: DC

## 2014-09-26 MED ORDER — GABAPENTIN 100 MG PO CAPS
300.0000 mg | ORAL_CAPSULE | Freq: Three times a day (TID) | ORAL | Status: DC
  Administered 2014-09-26: 09:00:00 300 mg via ORAL
  Filled 2014-09-26: qty 3

## 2014-09-26 MED ORDER — ACETAMINOPHEN 325 MG PO TABS: 650.0000 mg | ORAL_TABLET | Freq: Four times a day (QID) | ORAL | Status: DC | PRN

## 2014-09-26 MED ORDER — DEXAMETHASONE 4 MG PO TABS: 8.0000 mg | ORAL_TABLET | Freq: Every day | ORAL | Status: DC

## 2014-09-26 MED ORDER — FLUTICASONE PROPIONATE 50 MCG/ACT NA SUSP
2.0000 | Freq: Every day | NASAL | Status: DC | PRN
  Filled 2014-09-26: qty 16

## 2014-09-26 MED ORDER — DOXYCYCLINE HYCLATE 100 MG PO TABS: 100.0000 mg | ORAL_TABLET | Freq: Two times a day (BID) | ORAL | Status: DC

## 2014-09-26 MED ORDER — DOXYCYCLINE HYCLATE 100 MG PO CAPS
100.0000 mg | ORAL_CAPSULE | Freq: Two times a day (BID) | ORAL | Status: DC
  Administered 2014-09-26: 09:00:00 100 mg via ORAL
  Filled 2014-09-26: qty 1

## 2014-09-26 MED ORDER — ASPIRIN EC 81 MG PO TBEC
81.0000 mg | DELAYED_RELEASE_TABLET | Freq: Every day | ORAL | Status: DC
Start: 2014-09-26 — End: 2014-09-26

## 2014-09-26 MED ORDER — FENTANYL 50 MCG/HR TD PT72: 100.0000 ug | MEDICATED_PATCH | TRANSDERMAL | Status: DC

## 2014-09-26 MED ORDER — FENTANYL 75 MCG/HR TD PT72: 75.0000 ug | MEDICATED_PATCH | TRANSDERMAL | Status: DC

## 2014-09-26 MED ORDER — ENOXAPARIN SODIUM 80 MG/0.8ML ~~LOC~~ SOLN
80.0000 mg | Freq: Two times a day (BID) | SUBCUTANEOUS | Status: DC
  Administered 2014-09-26: 02:00:00 80 mg via SUBCUTANEOUS
  Filled 2014-09-26 (×3): qty 0.8

## 2014-09-26 MED ORDER — SODIUM CHLORIDE 0.9 % IV SOLN
INTRAVENOUS | Status: AC
  Administered 2014-09-26: 02:00:00 via INTRAVENOUS

## 2014-09-26 MED ORDER — DEXAMETHASONE 4 MG PO TABS
4.0000 mg | ORAL_TABLET | Freq: Every day | ORAL | Status: DC
  Filled 2014-09-26: qty 1

## 2014-09-26 MED ORDER — OXYCODONE HCL 5 MG PO TABS: 15.0000 mg | ORAL_TABLET | Freq: Four times a day (QID) | ORAL | Status: DC | PRN

## 2014-09-26 MED ORDER — CITALOPRAM HYDROBROMIDE 20 MG PO TABS
20.0000 mg | ORAL_TABLET | Freq: Every day | ORAL | Status: DC
  Filled 2014-09-26: qty 1

## 2014-09-26 MED ORDER — DOCUSATE SODIUM 100 MG PO CAPS: 100.0000 mg | ORAL_CAPSULE | Freq: Every day | ORAL | Status: DC | PRN

## 2014-09-26 MED ORDER — CHLORHEXIDINE GLUCONATE 0.12 % MT SOLN: 15.0000 mL | Freq: Two times a day (BID) | OROMUCOSAL | Status: DC

## 2014-09-26 NOTE — H&P (Signed)
[facility] NAME: Corey Sims    MR#:  [id number]  DATE OF BIRTH:  [birth year]  DATE OF ADMISSION:  09/25/2014  PRIMARY CARE PHYSICIAN: Crecencio Mc, MD   REQUESTING/REFERRING PHYSICIAN: Lenise Arena  CHIEF COMPLAINT:   Chief Complaint  Patient presents with  . Leg Pain   Bilateral leg pain and weakness with inability to stand or walk since yesterday HISTORY OF PRESENT ILLNESS:  Corey Sims  is a 49 y.o. male with a known history of squamous cell carcinoma of the tongue status post partial glossectomy, cervical lymph node dissection, undergoing chemotherapy per oncology, history of left upper DVT in January 2016 on subcutaneous Lovenox presents to the emergency room with the complaints of acute onset of bilateral leg pain down from the knees to the feet with associated weakness to the point where he cannot stand or walk which was noticed yesterday. No history of any recent trauma or fall. No fever or chills. No bladder or bowel disturbances. No focal loss of sensations. Denies any chest pain,shortness of breath, cough, nausea, vomiting, diarrhea, constipation, abdominal pain, dysuria. In the emergency room patient was evaluated by the ED physician and was found to be afebrile, blood pressure slightly low which is kind of a usual for him. Lab work was unremarkable except for mildly low sodium of 129 and a mildly elevated alkaline phosphatase. Since patient was not able to stand or walk, hospitalists service was consulted for further management and evaluation. Patient received IV Dilaudid in the ED following which his pain is under control at this time.  PAST MEDICAL HISTORY:   Past Medical History  Diagnosis Date  . Allergy   . Cancer     tongue and neck    PAST SURGICAL HISTORY:   Past Surgical History  Procedure Laterality Date  . Excision of tongue lesion with laser  08/13/11  . Neck dissection Bilateral  08/13/11  . Thoracotomy      SOCIAL HISTORY:   History  Substance Use Topics  . Smoking status: Never Smoker   . Smokeless tobacco: Not on file  . Alcohol Use: No     Comment: very limited    FAMILY HISTORY:   Family History  Problem Relation Age of Onset  . Cancer Father 74    prostate     DRUG ALLERGIES:   Allergies  Allergen Reactions  . Azithromycin Other (See Comments)    Pt states he gets real flushed red and sweaty.  . Penicillins Hives    Childhood reaction reported to patient by mother.  . Sulfa Antibiotics     Childhood reactions  . Erythromycin Base Swelling    REVIEW OF SYSTEMS:   Review of Systems  Constitutional: Negative for fever, chills and malaise/fatigue.  HENT: Negative for ear pain, hearing loss, nosebleeds, sore throat and tinnitus.   Eyes: Negative for blurred vision, double vision, pain, discharge and redness.  Respiratory: Negative for cough, hemoptysis, sputum production, shortness of breath and wheezing.   Cardiovascular: Negative for chest pain, palpitations, orthopnea and leg swelling.  Gastrointestinal: Negative for nausea, vomiting, abdominal pain, diarrhea, constipation, blood in stool and melena.  Genitourinary: Negative for dysuria, urgency, frequency and hematuria.  Musculoskeletal: Negative for back pain and neck pain.       Bilateral leg pain starting from bilateral knees down to the legs with associated weakness to the point where he cannot stand or walk.  Skin: Negative for itching  and rash.       Chronic wound left upper chest wall +  Neurological: Negative for dizziness, tingling, sensory change, focal weakness and seizures.  Endo/Heme/Allergies: Does not bruise/bleed easily.  Psychiatric/Behavioral: Negative for depression. The patient is not nervous/anxious.     MEDICATIONS AT HOME:   Prior to Admission medications   Medication Sig Start Date End Date Taking? Authorizing Provider  citalopram (CELEXA) 20 MG tablet  Take 1 tablet (20 mg total) by mouth at bedtime. 09/24/14  Yes Evlyn Kanner, NP  dexamethasone (DECADRON) 4 MG tablet Take 1 tablet (4 mg total) by mouth daily. 09/20/14  Yes [name], MD  doxycycline (VIBRA-TABS) 100 MG tablet Take 100 mg by mouth 2 (two) times daily.   Yes Historical Provider, MD  enoxaparin (LOVENOX) 80 MG/0.8ML injection Inject 80 mg into the skin 2 (two) times daily.   Yes Historical Provider, MD  fentaNYL (DURAGESIC - DOSED MCG/HR) 100 MCG/HR Place 1 patch (100 mcg total) onto the skin every other day. 09/13/14  Yes [name], MD  fentaNYL (DURAGESIC - DOSED MCG/HR) 75 MCG/HR Place 1 patch (75 mcg total) onto the skin every other day. 09/13/14  Yes [name], MD  gabapentin (NEURONTIN) 100 MG capsule Take 300 mg by mouth 3 (three) times daily.   Yes Historical Provider, MD  omeprazole (PRILOSEC OTC) 20 MG tablet Take 20 mg by mouth every morning.   Yes Historical Provider, MD  oxyCODONE (ROXICODONE) 15 MG immediate release tablet Take 1-2 tablets (15-30 mg total) by mouth every 6 (six) hours as needed for pain. 09/13/14  Yes [name], MD  ALPRAZolam Duanne Moron) 0.25 MG tablet Take 1 tablet (0.25 mg total) by mouth 2 (two) times daily as needed for sleep or anxiety. Patient not taking: Reported on 09/25/2014 08/27/12   Crecencio Mc, MD  azelastine (ASTELIN) 0.1 % nasal spray Place 1 spray into the nose 2 (two) times daily as needed. For allergies    Historical Provider, MD  chlorhexidine (PERIDEX) 0.12 % solution Use as directed 15 mLs in the mouth or throat 2 (two) times daily.    Historical Provider, MD  docusate sodium (COLACE) 100 MG capsule Take 100 mg by mouth daily as needed. for constipation.    Historical Provider, MD  fluticasone (FLONASE) 50 MCG/ACT nasal spray Place 2 sprays into the nose daily as needed. for allergies    Historical Provider, MD      VITAL SIGNS:  Blood pressure 85/61, pulse 76, temperature 98.2 F (36.8 C), temperature source Oral, resp.  rate 18, height 6\' 1"  (1.854 m), weight 79.379 kg (175 lb), SpO2 94 %.  PHYSICAL EXAMINATION:  Physical Exam  Constitutional: He is oriented to person, place, and time. No distress.  Mildly cachectic  HENT:  Head: Normocephalic and atraumatic.  Right Ear: External ear normal.  Left Ear: External ear normal.  Nose: Nose normal.  Mouth/Throat: Oropharynx is clear and moist. No oropharyngeal exudate.  Eyes: EOM are normal. Pupils are equal, round, and reactive to light. No scleral icterus.  Neck: Neck supple. No JVD present. No thyromegaly present.  Cardiovascular: Normal rate, regular rhythm, normal heart sounds and intact distal pulses.  Exam reveals no friction rub.   No murmur heard. Respiratory: Effort normal and breath sounds normal. No respiratory distress. He has no wheezes. He has no rales. He exhibits no tenderness.  Chronic wound left upper chest wall covered with dressing +  GI: Soft. Bowel sounds are normal. He exhibits no  distension and no mass. There is no tenderness. There is no rebound and no guarding.  Musculoskeletal: Normal range of motion. He exhibits no edema.  Lymphadenopathy:    He has no cervical adenopathy.  Neurological: He is alert and oriented to person, place, and time. He has normal reflexes. He displays normal reflexes. No cranial nerve deficit. He exhibits normal muscle tone.  Skin: Skin is warm. No rash noted. No erythema.  Psychiatric: He has a normal mood and affect. His behavior is normal. Thought content normal.   LABORATORY PANEL:   CBC  Recent Labs Lab 09/25/14 2000  WBC 8.1  HGB 11.6*  HCT 36.1*  PLT 302   ------------------------------------------------------------------------------------------------------------------  Chemistries   Recent Labs Lab 09/25/14 2000  NA 128*  K 4.2  CL 92*  CO2 29  GLUCOSE 166*  BUN 9  CREATININE 0.72  CALCIUM 8.3*  AST 18  ALT 83*  ALKPHOS 165*  BILITOT 0.4    ------------------------------------------------------------------------------------------------------------------  Cardiac Enzymes  Recent Labs Lab 09/25/14 2000  TROPONINI <0.03   ------------------------------------------------------------------------------------------------------------------  RADIOLOGY:  Dg Chest 2 View  09/25/2014   CLINICAL DATA:  Productive cough for several days, worsening. Increased weakness. History of head and neck cancer. Nonhealing ulcers in the left neck and sternum.  EXAM: CHEST  2 VIEW  COMPARISON:  CT chest 07/27/2014.  Chest 10/10/2013.  FINDINGS: Shallow inspiration. Linear atelectasis in the left lung base. Heart size and pulmonary vascularity are normal for technique. No focal airspace disease or consolidation in the lungs. Somewhat a amorphous shadows are projected over the left upper chest, likely corresponding to the chest wall mass seen on previous CT. Surgical clips in the left side of the neck.  IMPRESSION: No active cardiopulmonary disease.   Electronically Signed   By: Lucienne Capers M.D.   On: 09/25/2014 23:09    EKG:   Orders placed or performed during the hospital encounter of 09/25/14  . ED EKG  . ED EKG  . EKG 12-Lead  . EKG 12-Lead    IMPRESSION AND PLAN:   1. Acute onset of bilateral leg pain and weakness with inability to stand or walk in a patient with known squamous cell carcinoma of tongue. Etiology unclear at this time, rule out spinal metastases, rule out infection which is doubtful. Plan: Admit, pain control medications, MRI of lumbar spine to rule out spinal metastases/infection. PT consult, oncology consultation for further advice. 2. Squamous cell carcinoma of tongue status post partial glossectomy, cervical lymph node dissection-undergoing chemotherapy per oncology. Oncology consultation placed. 3. Hyponatremia, mild. IV hydration with normal saline, follow-up BMP. 4. History of DVT of left upper extremity in January  2016, patient on subcutaneous Lovenox twice a day. Continue same. 5. Chronic wound left upper chest wall, on Doxy. Stable. Continue same, wound care.    All the records are reviewed and case discussed with ED provider. Management plans discussed with the patient, family and they are in agreement.  CODE STATUS: Full code  TOTAL TIME TAKING CARE OF THIS PATIENT: 50 minutes.    Juluis Mire M.D on 09/26/2014 at 12:10 AM  Between 7am to 6pm - Pager - [phone]  After 6pm go to [email or web address] - password [email or web address] [facility]  Office  [phone]  CC: Primary care physician; Crecencio Mc, MD

## 2014-09-26 NOTE — Plan of Care (Signed)
Problem: Discharge Progression Outcomes Goal: Other Discharge Outcomes/Goals Outcome: Completed/Met Date Met:  09/26/14 Pt for discharge home with spouse. With hh. Alert. No distress.  Instructions  Discussed with pt and wife. P.t. Saw and  Pt to go home with rolling walker. Diet / activity and f/u discussed. Iv site d/cd.

## 2014-09-26 NOTE — Discharge Instructions (Signed)
Given

## 2014-09-26 NOTE — Progress Notes (Signed)
Pt for discharge home with spouse with hh. Alert. Iv d/cd site clear.  Instructions to pt and wife.no new presc.  Home meds returned. Ready for discharge.

## 2014-09-26 NOTE — Plan of Care (Signed)
Problem: Discharge Progression Outcomes Goal: Discharge plan in place and appropriate Individualization: Pt prefers to be called Corey Sims. He lives at home with his wife who is at the bedside and very active in helping with pt care. Hx head & neck cancer -- currently doing chemo, left upper arm DVT --currently taking Lovenox. Pt is quiet and withdrawn but cooperative and accepting of his current health. Goal: Other Discharge Outcomes/Goals Plan of care progress to goals: Pain: pain relieved with PRN oxycodone  Hemodynamically stable: VSS except BP normally runs low per pt, afebrile, continue to monitor labs  Complications: none evident  Tolerating diet: no problem identified  Activity: pt has had increased pain in bilateral lower extremities and is unable to walk in the past 24 hour with currently no known explanation. Pt was instructed to stay in the bed until further notice. No fall/injury this shift

## 2014-09-26 NOTE — Evaluation (Signed)
Physical Therapy Evaluation Patient Details Name: ROSHARD REZABEK MRN: [id number] DOB: June 07, 1965 Today's Date: 09/26/2014   History of Present Illness  Sayeed Weatherall is a 49 y.o. male with a known history of squamous cell carcinoma of the tongue status post partial glossectomy, cervical lymph node dissection, undergoing chemotherapy per oncology, history of left upper DVT in January 2016 on subcutaneous Lovenox presents to the emergency room with the complaints of acute onset of bilateral leg pain down from the knees to the feet with associated weakness to the point where he cannot stand or walk which was noticed yesterday. No history of any recent trauma or fall. No fever or chills. No bladder or bowel disturbances. No focal loss of sensations. Denies any chest pain,shortness of breath, cough, nausea, vomiting, diarrhea, constipation, abdominal pain, dysuria. In the emergency room patient was evaluated by the ED physician and was found to be afebrile, blood pressure slightly low which is kind of a usual for him. Lab work was unremarkable except for mildly low sodium of 129 and a mildly elevated alkaline phosphatase. Since patient was not able to stand or walk, hospitalists service was consulted for further management and evaluation. Patient received IV Dilaudid in the ED following which his pain is under control at this time.  Clinical Impression  Patient with unexplained bilateral knee pain distal to the knees reports that his pain is less today. He is independent in bed mobility and ambulates 75 ft with a rolling walker and CGA. Patient able to go up and down 4 steps with one rail and CGA. He continues to have LE weakness and impaired balance placing him at increased risk of falling. Patient will benefit from skilled PT services to return him to his prior level of independence.    Follow Up Recommendations Home health PT;Supervision/Assistance - 24 hour    Equipment Recommendations  Rolling  walker with 5" wheels    Recommendations for Other Services       Precautions / Restrictions Precautions Precautions: Fall Restrictions Weight Bearing Restrictions: No      Mobility  Bed Mobility Overal bed mobility: Independent (Does not roll to left due to increased shoulder/neck pain)             General bed mobility comments: Independent supine to sit   Transfers Overall transfer level: Modified independent Equipment used: Rolling walker (2 wheeled)             General transfer comment: Verbal cues to sequencing and hand placment  Ambulation/Gait Ambulation/Gait assistance: Min guard Ambulation Distance (Feet): 75 Feet Assistive device: Rolling walker (2 wheeled) Gait Pattern/deviations: Decreased step length - right;Decreased step length - left Gait velocity: 1 Gait velocity interpretation: <1.8 ft/sec, indicative of risk for recurrent falls General Gait Details: Slow, guarded gait. Knee pain increased to 1/10 with ambulation.  Stairs            Wheelchair Mobility    Modified Rankin (Stroke Patients Only)       Balance Overall balance assessment: Needs assistance Sitting-balance support: Single extremity supported           Standing balance comment: Able to maintain standing balance 10 sec without support but with wide BOS; unable to tolerate challenges to balance.                             Pertinent Vitals/Pain Pain Assessment: 0-10 Pain Score:  (0 at rest, 1 with active knee motion)  Pain Location: bilateral lower legs    Home Living Family/patient expects to be discharged to:: Private residence Living Arrangements: Spouse/significant other;Other relatives Available Help at Discharge: Family Type of Home: House Home Access: Stairs to enter Entrance Stairs-Rails: Right;Left (cannot reach both) Entrance Stairs-Number of Steps: 2 Home Layout: One level        Prior Function Level of Independence: Independent          Comments:  (independent in home and community, driving)     Hand Dominance   Dominant Hand: Right    Extremity/Trunk Assessment   Upper Extremity Assessment: RUE deficits/detail;LUE deficits/detail RUE Deficits / Details: RUE WFL     LUE Deficits / Details: L shoulder flex and abd increase UE pain   Lower Extremity Assessment: Generalized weakness (Grossly 4/5 throughout except hip abd 3/5)         Communication   Communication: No difficulties  Cognition Arousal/Alertness: Awake/alert (OX3) Behavior During Therapy: WFL for tasks assessed/performed Overall Cognitive Status: Within Functional Limits for tasks assessed                      General Comments      Exercises        Assessment/Plan    PT Assessment Patient needs continued PT services  PT Diagnosis Difficulty walking;Generalized weakness   PT Problem List Decreased strength;Decreased activity tolerance;Decreased balance;Decreased mobility  PT Treatment Interventions     PT Goals (Current goals can be found in the Care Plan section) Acute Rehab PT Goals Patient Stated Goal: to have no leg pain and be able to go home PT Goal Formulation: With patient/family Time For Goal Achievement: 10/10/14 Potential to Achieve Goals: Good    Frequency Min 2X/week   Barriers to discharge        Co-evaluation               End of Session Equipment Utilized During Treatment: Gait belt Activity Tolerance: Patient limited by fatigue;Patient tolerated treatment well Patient left: in bed;with call bell/phone within reach;with bed alarm set;with nursing/sitter in room Nurse Communication: Mobility status (Informed MD of patient's mobility status)         Time: 0321-2248 PT Time Calculation (min) (ACUTE ONLY): 50 min   Charges:   PT Evaluation $Initial PT Evaluation Tier I: 1 Procedure PT Treatments $Gait Training: 23-37 mins   PT G Codes:       Violet Baldy, PT, [place] [facility]  Francesca Oman 09/26/2014, 9:53 AM

## 2014-09-26 NOTE — Discharge Summary (Signed)
[facility] NAME: Corey Sims    MR#:  [id number]  DATE OF BIRTH:  1965-12-25  DATE OF ADMISSION:  09/25/2014 ADMITTING PHYSICIAN: Juluis Mire, MD  DATE OF DISCHARGE: 09/26/2014 PRIMARY CARE PHYSICIAN: Crecencio Mc, MD    ADMISSION DIAGNOSIS:  Hyponatremia [E87.1] Generalized pain [R52] Squamous cell carcinoma [C80.1]  DISCHARGE DIAGNOSIS:  Principal Problem:   Bilateral leg weakness Active Problems:   Squamous cell carcinoma of lateral tongue   Hyponatremia   Leg pain   SECONDARY DIAGNOSIS:   Past Medical History  Diagnosis Date  . Allergy   . Cancer     tongue and neck    HOSPITAL COURSE:  This is a 49 year old male with a history of squamous cell carcinoma of the tongue who presented with bilateral leg weakness. For further details please refer to H&P.  #1. 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 His symptoms actually improved. He was on a titrating dose of dexamethasone. At this time he should go back to 8 mg daily. He has an outpatient follow-up tomorrow with his oncologist. Physical therapy did assess him while he was in the hospital. They recommended home with home health care. Patient's symptoms have much improved. Patient did not need an MRI. I findings were discussed in brief with Dr. Ronnell Freshwater who was on-call for oncology who agreed with the above plan. Family was also in agreement as his symptoms had pretty much resolved.  2. Squamous cell carcinoma of the lateral tongue: Patient will see Dr. Jeb Levering tomorrow. He will continue plan as set out by oncology. He should continue with fentanyl patch  Repeat. Hyponatremia: Very mild. Patient should have repeat BMP in the a.m.   DISCHARGE CONDITIONS AND DIET:  Patient is being discharged home with home health care. He is stable for discharge. Regular diet CONSULTS OBTAINED:   none  DRUG ALLERGIES:   Allergies   Allergen Reactions  . Azithromycin Other (See Comments)    Pt states he gets real flushed red and sweaty.  . Penicillins Hives    Childhood reaction reported to patient by mother.  . Sulfa Antibiotics     Childhood reactions  . Erythromycin Base Swelling    DISCHARGE MEDICATIONS:   Current Discharge Medication List    CONTINUE these medications which have CHANGED   Details  dexamethasone (DECADRON) 4 MG tablet Take 2 tablets (8 mg total) by mouth daily. Qty: 30 tablet, Refills: 0      CONTINUE these medications which have NOT CHANGED   Details  citalopram (CELEXA) 20 MG tablet Take 1 tablet (20 mg total) by mouth at bedtime. Qty: 30 tablet, Refills: 2   Associated Diagnoses: Head and neck cancer    doxycycline (VIBRA-TABS) 100 MG tablet Take 100 mg by mouth 2 (two) times daily.    enoxaparin (LOVENOX) 80 MG/0.8ML injection Inject 80 mg into the skin 2 (two) times daily.    fentaNYL (DURAGESIC - DOSED MCG/HR) 100 MCG/HR Place 1 patch (100 mcg total) onto the skin every other day. Qty: 15 patch, Refills: 0   Associated Diagnoses: Head and neck cancer    fentaNYL (DURAGESIC - DOSED MCG/HR) 75 MCG/HR Place 1 patch (75 mcg total) onto the skin every other day. Qty: 15 patch, Refills: 0   Associated Diagnoses: Head and neck cancer    gabapentin (NEURONTIN) 100 MG capsule Take 300 mg by mouth 3 (three) times daily.  omeprazole (PRILOSEC OTC) 20 MG tablet Take 20 mg by mouth every morning.    oxyCODONE (ROXICODONE) 15 MG immediate release tablet Take 1-2 tablets (15-30 mg total) by mouth every 6 (six) hours as needed for pain. Qty: 120 tablet, Refills: 0   Associated Diagnoses: Head and neck cancer    ALPRAZolam (XANAX) 0.25 MG tablet Take 1 tablet (0.25 mg total) by mouth 2 (two) times daily as needed for sleep or anxiety. Qty: 30 tablet, Refills: 2    azelastine (ASTELIN) 0.1 % nasal spray Place 1 spray into the nose 2 (two) times daily as needed. For allergies     chlorhexidine (PERIDEX) 0.12 % solution Use as directed 15 mLs in the mouth or throat 2 (two) times daily.    docusate sodium (COLACE) 100 MG capsule Take 100 mg by mouth daily as needed. for constipation.    fluticasone (FLONASE) 50 MCG/ACT nasal spray Place 2 sprays into the nose daily as needed. for allergies              Today   CHIEF COMPLAINT:  Patient is doing better this morning. He is able to ambulate. He walk with physical therapy. Family is requesting that the patient be discharged as he has an appointment tomorrow with oncology and his symptoms have pretty much resolved.   VITAL SIGNS:  Blood pressure 84/54, pulse 72, temperature 97.3 F (36.3 C), temperature source Oral, resp. rate 18, height 6\' 1"  (1.854 m), weight 79.833 kg (176 lb), SpO2 95 %.   REVIEW OF SYSTEMS:  Review of Systems  Constitutional: Negative for fever and chills.  Eyes: Negative for double vision.  Respiratory: Negative for hemoptysis.   Cardiovascular: Negative for chest pain, palpitations and orthopnea.  Gastrointestinal: Negative for nausea and abdominal pain.  Musculoskeletal: Negative for myalgias.  Skin: Negative for rash.  Neurological: Negative for dizziness, tingling, tremors, sensory change, focal weakness and headaches.       Weakness improved able to ambulate     PHYSICAL EXAMINATION:  GENERAL:  49 y.o.-year-old patient lying in the bed with no acute distress.  NECK:  Supple, no jugular venous distention. No thyroid enlargement, no tenderness.  LUNGS: Normal breath sounds bilaterally, no wheezing, rales,rhonchi  No use of accessory muscles of respiration.  CARDIOVASCULAR: S1, S2 normal. No murmurs, rubs, or gallops.  ABDOMEN: Soft, non-tender, non-distended. Bowel sounds present. No organomegaly or mass.  EXTREMITIES: No pedal edema, cyanosis, or clubbing. His strength is 4 out of 5 bilaterally and symmetrically. There are no neurological deficits noted. PSYCHIATRIC: The  patient is alert and oriented x 3.  SKIN: No obvious rash, lesion, or ulcer.   DATA REVIEW:   CBC  Recent Labs Lab 09/25/14 2000  WBC 8.1  HGB 11.6*  HCT 36.1*  PLT 302    Chemistries   Recent Labs Lab 09/25/14 2000  NA 128*  K 4.2  CL 92*  CO2 29  GLUCOSE 166*  BUN 9  CREATININE 0.72  CALCIUM 8.3*  AST 18  ALT 83*  ALKPHOS 165*  BILITOT 0.4    Cardiac Enzymes  Recent Labs Lab 09/25/14 2000  TROPONINI <0.03    Microbiology Results  @[id number] @  RADIOLOGY:  Dg Chest 2 View  09/25/2014   IMPRESSION: No active cardiopulmonary disease.   Electronically Signed   By: Lucienne Capers M.D.   On: 09/25/2014 23:09      Management plans discussed with the patient and he is in agreement. Plan also discussed with Dr. Grayland Ormond.  Stable for discharge home with Home health  Patient should follow up with Dr. Jeb Levering  CODE STATUS:     Code Status Orders        Start     Ordered   09/26/14 0146  Full code   Continuous     09/26/14 0145      TOTAL TIME TAKING CARE OF THIS PATIENT: 40 minutes.    Dyamon Sosinski M.D on 09/26/2014 at 11:17 AM  Between 7am to 6pm - Pager - [phone] After 6pm go to [email or web address] - password [email or web address] [facility]  Office  [phone]  CC: Primary care physician; Crecencio Mc, MD

## 2014-09-26 NOTE — Progress Notes (Addendum)
Corey Sims and his wife chose [facility] as their home health care provider. A referral was faxed and called to [facility] requesting home health PT and RN.

## 2014-09-26 NOTE — Progress Notes (Signed)
Corey Sims has a front wheel rolling walker. Wife reports that she will take 22 script for a rolling walker with seat to [facility] office tomorrow to see whether or not Corey Sims will cover the cost of a rolling walker with seat. Wife verbalized understanding that [facility] would be contacting them to initiate home health services within approximately 48 hours after hospital discharge. Marland Kitchen

## 2014-09-26 NOTE — ED Notes (Signed)
After calling report to Corey Sims on 1C, she immediately called back expressing concern over pt's blood pressure; spoke with Dr Reece Levy and was given new verbal orders for the floor; Corey Sims informed and pt is ready to transport to 1C

## 2014-09-27 ENCOUNTER — Inpatient Hospital Stay (HOSPITAL_BASED_OUTPATIENT_CLINIC_OR_DEPARTMENT_OTHER): Payer: BC Managed Care – PPO | Admitting: Oncology

## 2014-09-27 ENCOUNTER — Inpatient Hospital Stay: Payer: BC Managed Care – PPO

## 2014-09-27 ENCOUNTER — Other Ambulatory Visit: Payer: BC Managed Care – PPO

## 2014-09-27 ENCOUNTER — Telehealth: Payer: Self-pay | Admitting: *Deleted

## 2014-09-27 ENCOUNTER — Ambulatory Visit: Payer: BC Managed Care – PPO | Admitting: Oncology

## 2014-09-27 ENCOUNTER — Ambulatory Visit: Payer: BC Managed Care – PPO

## 2014-09-27 VITALS — BP 103/72 | HR 82 | Temp 95.2°F | Wt 173.7 lb

## 2014-09-27 DIAGNOSIS — G629 Polyneuropathy, unspecified: Secondary | ICD-10-CM

## 2014-09-27 DIAGNOSIS — C770 Secondary and unspecified malignant neoplasm of lymph nodes of head, face and neck: Secondary | ICD-10-CM

## 2014-09-27 DIAGNOSIS — Z85810 Personal history of malignant neoplasm of tongue: Secondary | ICD-10-CM | POA: Diagnosis not present

## 2014-09-27 DIAGNOSIS — R5383 Other fatigue: Secondary | ICD-10-CM

## 2014-09-27 DIAGNOSIS — Z7901 Long term (current) use of anticoagulants: Secondary | ICD-10-CM

## 2014-09-27 DIAGNOSIS — C021 Malignant neoplasm of border of tongue: Secondary | ICD-10-CM

## 2014-09-27 DIAGNOSIS — Z9221 Personal history of antineoplastic chemotherapy: Secondary | ICD-10-CM

## 2014-09-27 DIAGNOSIS — Z923 Personal history of irradiation: Secondary | ICD-10-CM

## 2014-09-27 DIAGNOSIS — G893 Neoplasm related pain (acute) (chronic): Secondary | ICD-10-CM

## 2014-09-27 DIAGNOSIS — Z86718 Personal history of other venous thrombosis and embolism: Secondary | ICD-10-CM

## 2014-09-27 DIAGNOSIS — C029 Malignant neoplasm of tongue, unspecified: Secondary | ICD-10-CM | POA: Diagnosis not present

## 2014-09-27 DIAGNOSIS — R531 Weakness: Secondary | ICD-10-CM

## 2014-09-27 DIAGNOSIS — D630 Anemia in neoplastic disease: Secondary | ICD-10-CM

## 2014-09-27 LAB — COMPREHENSIVE METABOLIC PANEL
ALBUMIN: 2.5 g/dL — AB (ref 3.5–5.0)
ALT: 55 U/L (ref 17–63)
ANION GAP: 6 (ref 5–15)
AST: 13 U/L — ABNORMAL LOW (ref 15–41)
Alkaline Phosphatase: 148 U/L — ABNORMAL HIGH (ref 38–126)
BUN: 14 mg/dL (ref 6–20)
CHLORIDE: 97 mmol/L — AB (ref 101–111)
CO2: 29 mmol/L (ref 22–32)
Calcium: 8.7 mg/dL — ABNORMAL LOW (ref 8.9–10.3)
Creatinine, Ser: 0.77 mg/dL (ref 0.61–1.24)
GFR calc non Af Amer: >60 mL/min (ref >60)
GLUCOSE: 186 mg/dL — AB (ref 65–99)
POTASSIUM: 3.4 mmol/L — AB (ref 3.5–5.1)
SODIUM: 132 mmol/L — AB (ref 135–145)
Total Bilirubin: 0.3 mg/dL (ref 0.3–1.2)
Total Protein: 6 g/dL — ABNORMAL LOW (ref 6.5–8.1)

## 2014-09-27 LAB — CBC WITH DIFFERENTIAL/PLATELET
Basophils Absolute: 0 10*3/uL (ref 0–0.1)
Basophils Relative: 0 %
EOS ABS: 0 10*3/uL (ref 0–0.7)
EOS PCT: 0 %
HEMATOCRIT: 35.7 % — AB (ref 40.0–52.0)
HEMOGLOBIN: 11.2 g/dL — AB (ref 13.0–18.0)
LYMPHS ABS: 0.2 10*3/uL — AB (ref 1.0–3.6)
Lymphocytes Relative: 3 %
MCH: 28.5 pg (ref 26.0–34.0)
MCHC: 31.2 g/dL — ABNORMAL LOW (ref 32.0–36.0)
MCV: 91.4 fL (ref 80.0–100.0)
MONO ABS: 0.2 10*3/uL (ref 0.2–1.0)
Monocytes Relative: 3 %
Neutro Abs: 6.7 10*3/uL — ABNORMAL HIGH (ref 1.4–6.5)
Neutrophils Relative %: 94 %
Platelets: 415 10*3/uL (ref 150–440)
RBC: 3.91 MIL/uL — AB (ref 4.40–5.90)
RDW: 24.6 % — ABNORMAL HIGH (ref 11.5–14.5)
WBC: 7.2 10*3/uL (ref 3.8–10.6)

## 2014-09-27 LAB — MAGNESIUM: MAGNESIUM: 2.1 mg/dL (ref 1.7–2.4)

## 2014-09-27 MED ORDER — CALCIUM CARBONATE-VITAMIN D 500-200 MG-UNIT PO TABS: 1.0000 | ORAL_TABLET | Freq: Two times a day (BID) | ORAL | Status: AC

## 2014-09-27 MED ORDER — METHOTREXATE SODIUM (PF) CHEMO INJECTION 250 MG/10ML
30.0000 mg/m2 | Freq: Once | INTRAMUSCULAR | Status: AC
  Administered 2014-09-27: 65 mg via INTRAVENOUS
  Filled 2014-09-27: qty 2.6

## 2014-09-27 MED ORDER — SODIUM CHLORIDE 0.9 % IV SOLN
Freq: Once | INTRAVENOUS | Status: AC
  Administered 2014-09-27: 12:00:00 via INTRAVENOUS
  Filled 2014-09-27: qty 4

## 2014-09-27 NOTE — Telephone Encounter (Signed)
Called patient and left message that Santiago Glad the wound nurse is covering several campuses and is oftentimes unavailable.  Asked pt to call back and let us know if he would prefer we send a referral to the wound center so that his care can be more consistent.

## 2014-09-29 ENCOUNTER — Inpatient Hospital Stay: Payer: BC Managed Care – PPO

## 2014-09-29 ENCOUNTER — Ambulatory Visit
Admission: RE | Admit: 2014-09-29 | Discharge: 2014-09-29 | Disposition: A | Discharge status: Home / Self Care | Payer: BC Managed Care – PPO | Source: Ambulatory Visit | Attending: Oncology | Admitting: Oncology

## 2014-09-29 ENCOUNTER — Telehealth: Payer: Self-pay | Admitting: *Deleted

## 2014-09-29 ENCOUNTER — Other Ambulatory Visit: Payer: Self-pay | Admitting: *Deleted

## 2014-09-29 DIAGNOSIS — C021 Malignant neoplasm of border of tongue: Secondary | ICD-10-CM | POA: Diagnosis not present

## 2014-09-29 DIAGNOSIS — C029 Malignant neoplasm of tongue, unspecified: Secondary | ICD-10-CM | POA: Diagnosis not present

## 2014-09-29 MED ORDER — KETOROLAC TROMETHAMINE 15 MG/ML IJ SOLN
15.0000 mg | Freq: Once | INTRAMUSCULAR | Status: AC
  Administered 2014-09-29: 15 mg via INTRAVENOUS

## 2014-09-29 MED ORDER — TECHNETIUM TC 99M MEDRONATE IV KIT
23.9500 | PACK | Freq: Once | INTRAVENOUS | Status: AC | PRN
  Administered 2014-09-29: 23.95 via INTRAVENOUS

## 2014-09-29 NOTE — Telephone Encounter (Signed)
Asking if he can come in to get pain med inj while here at hospital. [facility] per Dr Oliva Bustard. Pt coming at 1:30

## 2014-09-30 ENCOUNTER — Telehealth: Payer: Self-pay | Admitting: *Deleted

## 2014-09-30 DIAGNOSIS — C760 Malignant neoplasm of head, face and neck: Secondary | ICD-10-CM

## 2014-09-30 MED ORDER — OXYCODONE HCL 15 MG PO TABS: 15.0000 mg | ORAL_TABLET | Freq: Four times a day (QID) | ORAL | Status: DC | PRN

## 2014-09-30 NOTE — Telephone Encounter (Signed)
Pt has not called back yet.  Will discuss with pt at next appt.

## 2014-09-30 NOTE — Telephone Encounter (Signed)
Patient requesting refill for Oxycodone.  His mother-in-law can pick up prescription this afternoon around 3-4 pm.  Also states his wife has noted a change in the size of the wound on his neck - now the size of a pencil eraser.

## 2014-09-30 NOTE — Telephone Encounter (Signed)
Called pt to inform him that results from bone scan did not show any new areas within the bone. Pt verbalized understanding and stated that needs refill on oxycodone prescription. Informed pt that will have prescription ready for pick up this afternoon. Pt verbalized understanding. Pt also informed me that wound is being managed by home health agency and this time and would like to continue with home health care and not be referred to wound clinic.

## 2014-10-02 ENCOUNTER — Encounter: Payer: Self-pay | Admitting: Oncology

## 2014-10-02 NOTE — Progress Notes (Signed)
Corey Sims @ [facility] [phone]  Fax:([phone]) [place] OB: 1965/08/25  MR#: [id number]  TJQ#:[id number]  Patient Care Team: Crecencio Mc, MD as PCP - General (Internal Medicine)  CHIEF COMPLAINT:  Chief Complaint  Patient presents with  . Follow-up    Oncology History   37. 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 Margins were clear but close at 5 mm. Depth of invasion was 0.9 cm. There was no lymph vascular spread there was positive perineural spread. 22 nodes were at excised all negative for metastatic disease). 2. Mediastinal/hilar Adenopathy on PET scan - EBUS procedure and biopsy at [facility] on 09/02/2011 negative for malignancy.  Also RLL lung biopsy at [facility] on 08/30/2011 negative for malignancy. 3. Patient had lower left cervical lymph node removed.  (Surgery was done at [facility]) followed by introperative radiation therapy.(May of 2014) 4  [place] in May of 2014 insufficient tissue 5. 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 6. Suspected Recurrent disease in the neck patient was started on cis-platinum, cetuximab and radiation therapy July17, 2014 7. Deep Vein  thromboses in the left lower extremity on Xaralto 8. Finished radiation and chemotherapy December 13, 2012 9. Abnormal CT scan and a PET scan.  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 Patient started on 5-FU , Cis-platinum, docetaxel from February 11, 2013 has finished total  5  cycles of chemotherapy. 10. Progressing disease.  Started on palliative radiation treatment with cetuximab.  (February, 201 5) 11. Finished radiation then cetuximab in April of 2015. 12. On carboplatinum and Taxol  and cetuximab 13 Started on maintenance  cetuimab from  January 21, 2014 14.progressive disease so started on  keytruda from March 15, 2014 15.ecent episode of thrombosis of left subclavian, left axillary veinpatient underwent thrombolytic therapy.  Removal of port.  (Because of infection)and started on Lovenox. 16.progressive disease on KEYTRUDA.  Palliative radiation therapy 17.palliative radiation therapy,march 201 18.  On IV methotrexate starting from April of 2016     Primary cancer of tongue   08/26/2012 Initial Diagnosis Primary cancer of tongue    Oncology Flowsheet 09/14/2014 09/26/2014 09/26/2014 09/27/2014  Day, Cycle Day 1, Cycle 1 - - Day 1, Cycle 3  dexamethasone (DECADRON) IV [ 10 mg ] - - [ 10 mg ]  dexamethasone (DECADRON) PO - 8 mg   -  enoxaparin (LOVENOX) [place] - 80 mg 80 mg -  methotrexate (PF) IV 30 mg/m2 - - 30 mg/m2  ondansetron (ZOFRAN) IV [ 8 mg ] - - [ 8 mg ]    INTERVAL HISTORY: 49 year old gentleman with a history of stage IV carcinoma of tongue with extensive local involvement of bones in lymph node recently on methotrexate.  Patient presented to emergency room over the weekend with increasing leg pain and weakness in both lower extremity.  No particular cause was found.  Patient gradually felt better and was discharged.  Here for further evaluation patient describes pain in the both lower knee area.  Is severe tenderness.  Gradually declining performance status  REVIEW OF SYSTEMS:   Gen. status: Patient is weak tired.  Declining performance status.  HEENT: Continuing drainage from the left side of the neck and chest.  Lungs: Increasing shortness of breath.  GI:  No nausea.  No vomiting.  No diarrhea.  Lower extremity: Pain below both knees crams tenderness tingling numbness.  Lower extremity no swelling.  Neurological system: No headache no dizziness.  No weakness in lower extremity.  Cardiac: No chest pain.  No palpitations.  Musculoskeletal system no bony pains. All other systems have been reviewed  As per HPI.  Otherwise, a complete review of systems is negatve.  PAST MEDICAL HISTORY: Past Medical History  Diagnosis Date  . Allergy   . Cancer     tongue and neck    PAST SURGICAL HISTORY: Past Surgical History  Procedure Laterality Date  . Excision of tongue lesion with laser  08/13/11  . Neck dissection Bilateral 08/13/11  . Thoracotomy      FAMILY HISTORY Family History  Problem Relation Age of Onset  . Cancer Father 48    prostate     GYNECOLOGIC HISTORY:  No LMP for male patient.     ADVANCED DIRECTIVES:    HEALTH MAINTENANCE: History  Substance Use Topics  . Smoking status: Never Smoker   . Smokeless tobacco: Not on file  . Alcohol Use: No     Comment: very limited     Colonoscopy:  PAP:  Bone density:  Lipid panel:  Allergies  Allergen Reactions  . Azithromycin Other (See Comments)    Pt states he gets real flushed red and sweaty.  . Penicillins Hives    Childhood reaction reported to patient by mother.  . Sulfa Antibiotics     Childhood reactions  . Erythromycin Base Swelling    Current Outpatient Prescriptions  Medication Sig Dispense Refill  . azelastine (ASTELIN) 0.1 % nasal spray Place 1 spray into the nose 2 (two) times daily as needed. For allergies    . chlorhexidine (PERIDEX) 0.12 % solution Use as directed 15 mLs in the mouth or throat 2 (two) times daily.    . citalopram (CELEXA) 20 MG tablet Take 1 tablet (20 mg total) by mouth at bedtime. 30 tablet 2  . dexamethasone (DECADRON) 4 MG tablet Take 2 tablets (8 mg total) by mouth daily. 30 tablet 0  . docusate sodium (COLACE) 100 MG capsule Take 100 mg by mouth daily as needed. for constipation.    Marland Kitchen doxycycline (VIBRA-TABS) 100 MG tablet Take 100 mg by mouth 2 (two) times daily.    Marland Kitchen enoxaparin (LOVENOX) 80 MG/0.8ML injection Inject 80 mg into the skin 2 (two) times daily.    . fentaNYL (DURAGESIC - DOSED MCG/HR) 100 MCG/HR Place 1 patch (100 mcg total) onto the skin every other day. 15 patch 0   . fentaNYL (DURAGESIC - DOSED MCG/HR) 75 MCG/HR Place 1 patch (75 mcg total) onto the skin every other day. 15 patch 0  . fluticasone (FLONASE) 50 MCG/ACT nasal spray Place 2 sprays into the nose daily as needed. for allergies    . gabapentin (NEURONTIN) 100 MG capsule Take 300 mg by mouth 3 (three) times daily.    Marland Kitchen omeprazole (PRILOSEC OTC) 20 MG tablet Take 20 mg by mouth every morning.    Marland Kitchen ALPRAZolam (XANAX) 0.25 MG tablet Take 1 tablet (0.25 mg total) by mouth 2 (two) times daily as needed for sleep or anxiety. (Patient not taking: Reported on 09/25/2014) 30 tablet 2  . calcium-vitamin D (OSCAL WITH D) 500-200 MG-UNIT per tablet Take 1 tablet by mouth 2 (two) times daily. 60 tablet 3  . oxyCODONE (ROXICODONE) 15 MG immediate release tablet Take 1-2 tablets (15-30 mg total)  by mouth every 6 (six) hours as needed for pain. 120 tablet 0   No current facility-administered medications for this visit.    OBJECTIVE:  Filed Vitals:   09/27/14 1028  BP: 103/72  Pulse: 82  Temp: 95.2 F (35.1 C)     Body mass index is 22.92 kg/(m^2).    ECOG FS:2 - Symptomatic, <50% confined to bed  PHYSICAL EXAM: Gen. status: Performance status is declining HEENT increasing pain and swelling on the left side of the neck redness.  Lungs: Air entry on both sides no palpitation or rhonchi no rales.  Abdomen: Soft liver and spleen not enlarged.  Cardiac: Tachycardia skin: No rash.  Neurological system:higher functions within normal limit.  Motor and sensory system in both upper and lower extremities within normal limit.  Tenderness in both calf muscles.  Musculoskeletal system no joint swelling.  No joint tenderness. Psychiatric system: Patient is somewhat depressed All other systems have been examined.    LAB RESULTS:  Infusion on 09/27/2014  Component Date Value Ref Range Status  . WBC 09/27/2014 7.2  3.8 - 10.6 K/uL Final  . RBC 09/27/2014 3.91* 4.40 - 5.90 MIL/uL Final  . Hemoglobin 09/27/2014 11.2*  13.0 - 18.0 g/dL Final  . HCT 09/27/2014 35.7* 40.0 - 52.0 % Final  . MCV 09/27/2014 91.4  80.0 - 100.0 fL Final  . MCH 09/27/2014 28.5  26.0 - 34.0 pg Final  . MCHC 09/27/2014 31.2* 32.0 - 36.0 g/dL Final  . RDW 09/27/2014 24.6* 11.5 - 14.5 % Final  . Platelets 09/27/2014 415  150 - 440 K/uL Final  . Neutrophils Relative % 09/27/2014 94   Final  . Neutro Abs 09/27/2014 6.7* 1.4 - 6.5 K/uL Final  . Lymphocytes Relative 09/27/2014 3   Final  . Lymphs Abs 09/27/2014 0.2* 1.0 - 3.6 K/uL Final  . Monocytes Relative 09/27/2014 3   Final  . Monocytes Absolute 09/27/2014 0.2  0.2 - 1.0 K/uL Final  . Eosinophils Relative 09/27/2014 0   Final  . Eosinophils Absolute 09/27/2014 0.0  0 - 0.7 K/uL Final  . Basophils Relative 09/27/2014 0   Final  . Basophils Absolute 09/27/2014 0.0  0 - 0.1 K/uL Final  . Sodium 09/27/2014 132* 135 - 145 mmol/L Final  . Potassium 09/27/2014 3.4* 3.5 - 5.1 mmol/L Final  . Chloride 09/27/2014 97* 101 - 111 mmol/L Final  . CO2 09/27/2014 29  22 - 32 mmol/L Final  . Glucose, Bld 09/27/2014 186* 65 - 99 mg/dL Final  . BUN 09/27/2014 14  6 - 20 mg/dL Final  . Creatinine, Ser 09/27/2014 0.77  0.61 - 1.24 mg/dL Final  . Calcium 09/27/2014 8.7* 8.9 - 10.3 mg/dL Final  . Total Protein 09/27/2014 6.0* 6.5 - 8.1 g/dL Final  . Albumin 09/27/2014 2.5* 3.5 - 5.0 g/dL Final  . AST 09/27/2014 13* 15 - 41 U/L Final  . ALT 09/27/2014 55  17 - 63 U/L Final  . Alkaline Phosphatase 09/27/2014 148* 38 - 126 U/L Final  . Total Bilirubin 09/27/2014 0.3  0.3 - 1.2 mg/dL Final  . GFR calc non Af Amer 09/27/2014 >60  >60 mL/min Final  . GFR calc Af Amer 09/27/2014 >60  >60 mL/min Final   Comment: (NOTE) The eGFR has been calculated using the CKD EPI equation. This calculation has not been validated in all clinical situations. eGFR's persistently <60 mL/min signify possible Chronic Kidney Disease.   . Anion gap 09/27/2014 6  5 - 15 Final  Appointment on 09/27/2014  Component Date  Value Ref Range Status  . Magnesium 09/27/2014 2.1  1.7 - 2.4 mg/dL Final  Admission on 09/25/2014, Discharged on 09/26/2014  Component Date Value Ref Range Status  . WBC 09/25/2014 8.1  3.8 - 10.6 K/uL Final  . RBC 09/25/2014 3.99* 4.40 - 5.90 MIL/uL Final  . Hemoglobin 09/25/2014 11.6* 13.0 - 18.0 g/dL Final  . HCT 09/25/2014 36.1* 40.0 - 52.0 % Final  . MCV 09/25/2014 90.4  80.0 - 100.0 fL Final  . MCH 09/25/2014 29.2  26.0 - 34.0 pg Final  . MCHC 09/25/2014 32.3  32.0 - 36.0 g/dL Final  . RDW 09/25/2014 24.4* 11.5 - 14.5 % Final  . Platelets 09/25/2014 302  150 - 440 K/uL Final  . Neutrophils Relative % 09/25/2014 95   Final  . Neutro Abs 09/25/2014 7.7* 1.4 - 6.5 K/uL Final  . Lymphocytes Relative 09/25/2014 2   Final  . Lymphs Abs 09/25/2014 0.1* 1.0 - 3.6 K/uL Final  . Monocytes Relative 09/25/2014 3   Final  . Monocytes Absolute 09/25/2014 0.3  0.2 - 1.0 K/uL Final  . Eosinophils Relative 09/25/2014 0   Final  . Eosinophils Absolute 09/25/2014 0.0  0 - 0.7 K/uL Final  . Basophils Relative 09/25/2014 0   Final  . Basophils Absolute 09/25/2014 0.0  0 - 0.1 K/uL Final  . Sodium 09/25/2014 128* 135 - 145 mmol/L Final  . Potassium 09/25/2014 4.2  3.5 - 5.1 mmol/L Final  . Chloride 09/25/2014 92* 101 - 111 mmol/L Final  . CO2 09/25/2014 29  22 - 32 mmol/L Final  . Glucose, Bld 09/25/2014 166* 65 - 99 mg/dL Final  . BUN 09/25/2014 9  6 - 20 mg/dL Final  . Creatinine, Ser 09/25/2014 0.72  0.61 - 1.24 mg/dL Final  . Calcium 09/25/2014 8.3* 8.9 - 10.3 mg/dL Final  . Total Protein 09/25/2014 5.7* 6.5 - 8.1 g/dL Final  . Albumin 09/25/2014 2.2* 3.5 - 5.0 g/dL Final  . AST 09/25/2014 18  15 - 41 U/L Final  . ALT 09/25/2014 83* 17 - 63 U/L Final  . Alkaline Phosphatase 09/25/2014 165* 38 - 126 U/L Final  . Total Bilirubin 09/25/2014 0.4  0.3 - 1.2 mg/dL Final  . GFR calc non Af Amer 09/25/2014 >60  >60 mL/min Final  . GFR calc Af Amer 09/25/2014 >60  >60 mL/min Final   Comment:  (NOTE) The eGFR has been calculated using the CKD EPI equation. This calculation has not been validated in all clinical situations. eGFR's persistently <60 mL/min signify possible Chronic Kidney Disease.   . Anion gap 09/25/2014 7  5 - 15 Final  . Prothrombin Time 09/25/2014 14.2  11.4 - 15.0 seconds Final  . INR 09/25/2014 1.08   Final  . Troponin I 09/25/2014 <0.03  <0.031 ng/mL Final   Comment:        NO INDICATION OF MYOCARDIAL INJURY.   . Color, Urine 09/25/2014 YELLOW* YELLOW Final  . APPearance 09/25/2014 HAZY* CLEAR Final  . Glucose, UA 09/25/2014 NEGATIVE  NEGATIVE mg/dL Final  . Bilirubin Urine 09/25/2014 NEGATIVE  NEGATIVE Final  . Ketones, ur 09/25/2014 NEGATIVE  NEGATIVE mg/dL Final  . Specific Gravity, Urine 09/25/2014 1.006  1.005 - 1.030 Final  . Hgb urine dipstick 09/25/2014 2+* NEGATIVE Final  . pH 09/25/2014 8.0  5.0 - 8.0 Final  . Protein, ur 09/25/2014 30* NEGATIVE mg/dL Final  . Nitrite 09/25/2014 NEGATIVE  NEGATIVE Final  . Leukocytes, UA 09/25/2014  NEGATIVE  NEGATIVE Final  . RBC / HPF 09/25/2014 6-30  0 - 5 RBC/hpf Final  . WBC, UA 09/25/2014 0-5  0 - 5 WBC/hpf Final  . Bacteria, UA 09/25/2014 NONE SEEN  NONE SEEN Final  . Squamous Epithelial / LPF 09/25/2014 NONE SEEN  NONE SEEN Final  . Sodium 09/26/2014 135  135 - 145 mmol/L Final  . Potassium 09/26/2014 4.5  3.5 - 5.1 mmol/L Final  . Chloride 09/26/2014 102  101 - 111 mmol/L Final  . CO2 09/26/2014 27  22 - 32 mmol/L Final  . Glucose, Bld 09/26/2014 121* 65 - 99 mg/dL Final  . BUN 09/26/2014 10  6 - 20 mg/dL Final  . Creatinine, Ser 09/26/2014 0.62  0.61 - 1.24 mg/dL Final  . Calcium 09/26/2014 8.5* 8.9 - 10.3 mg/dL Final  . GFR calc non Af Amer 09/26/2014 >60  >60 mL/min Final  . GFR calc Af Amer 09/26/2014 >60  >60 mL/min Final   Comment: (NOTE) The eGFR has been calculated using the CKD EPI equation. This calculation has not been validated in all clinical situations. eGFR's persistently <60  mL/min signify possible Chronic Kidney Disease.   . Anion gap 09/26/2014 6  5 - 15 Final     STUDIES: Dg Chest 2 View  09/25/2014   CLINICAL DATA:  Productive cough for several days, worsening. Increased weakness. History of head and neck cancer. Nonhealing ulcers in the left neck and sternum.  EXAM: CHEST  2 VIEW  COMPARISON:  CT chest 07/27/2014.  Chest 10/10/2013.  FINDINGS: Shallow inspiration. Linear atelectasis in the left lung base. Heart size and pulmonary vascularity are normal for technique. No focal airspace disease or consolidation in the lungs. Somewhat a amorphous shadows are projected over the left upper chest, likely corresponding to the chest wall mass seen on previous CT. Surgical clips in the left side of the neck.  IMPRESSION: No active cardiopulmonary disease.   Electronically Signed   By: Lucienne Capers M.D.   On: 09/25/2014 23:09   Nm Bone Scan Whole Body  09/29/2014   CLINICAL DATA:  Tongue cancer. Back pain. Left arm and shoulder pain.  EXAM: NUCLEAR MEDICINE WHOLE BODY BONE SCAN  TECHNIQUE: Whole body anterior and posterior images were obtained approximately 3 hours after intravenous injection of radiopharmaceutical.  RADIOPHARMACEUTICALS:  24 mCi Technetium-61mMDP IV  COMPARISON:  PET-CT 05/31/2014 .  FINDINGS: Increased activity noted in the medial left clavicle, manubrium, and sternum. Similar findings noted on recent PET-CT. These findings suggest metastatic disease. No other focal abnormalities noted to suggest metastatic disease.  IMPRESSION: Intense increased activity noted in the medial left clavicle, manubrium, and sternum consistent with the possibility of metastatic disease. Similar findings noted on recent PET-CT. No other focal abnormalities identified.   Electronically Signed   By: TMarcello Moores Register   On: 09/29/2014 13:01    ASSESSMENT: 49year old gentleman with stage IV and recurrent and progressive carcinoma of tongue   on methotrexate. Lower extremity  pain etiology not clear Anemia MEDICAL DECISION MAKING:  All lab data has been reviewed.  Patient's last hospitalization records have been reviewed increase Decadron to 8 mg daily Bone scan to be sure patient does not have any bone metastases causing lower extremity pain Overall pain appears to be secondary to neuropathy exact etiology of which is not clear Continue methotrexate   Patient expressed understanding and was in agreement with this plan. He also understands that He can call clinic at any time with any  questions, concerns, or complaints.    Primary cancer of tongue   Staging form: Lip and Oral Cavity, AJCC 7th Edition     Clinical: Stage IVC (T2, N2, M1) - Unsigned   Corey Gleason, MD   10/02/2014 10:13 AM

## 2014-10-04 ENCOUNTER — Inpatient Hospital Stay: Payer: BC Managed Care – PPO

## 2014-10-04 ENCOUNTER — Inpatient Hospital Stay (HOSPITAL_BASED_OUTPATIENT_CLINIC_OR_DEPARTMENT_OTHER): Payer: BC Managed Care – PPO | Admitting: Oncology

## 2014-10-04 ENCOUNTER — Encounter: Payer: Self-pay | Admitting: Oncology

## 2014-10-04 VITALS — BP 108/76 | HR 98 | Temp 96.0°F | Wt 175.7 lb

## 2014-10-04 DIAGNOSIS — G629 Polyneuropathy, unspecified: Secondary | ICD-10-CM

## 2014-10-04 DIAGNOSIS — Z85810 Personal history of malignant neoplasm of tongue: Secondary | ICD-10-CM

## 2014-10-04 DIAGNOSIS — C021 Malignant neoplasm of border of tongue: Secondary | ICD-10-CM

## 2014-10-04 DIAGNOSIS — C7951 Secondary malignant neoplasm of bone: Secondary | ICD-10-CM | POA: Diagnosis not present

## 2014-10-04 DIAGNOSIS — C029 Malignant neoplasm of tongue, unspecified: Secondary | ICD-10-CM

## 2014-10-04 DIAGNOSIS — R0602 Shortness of breath: Secondary | ICD-10-CM

## 2014-10-04 DIAGNOSIS — R531 Weakness: Secondary | ICD-10-CM

## 2014-10-04 DIAGNOSIS — G893 Neoplasm related pain (acute) (chronic): Secondary | ICD-10-CM

## 2014-10-04 DIAGNOSIS — Z86718 Personal history of other venous thrombosis and embolism: Secondary | ICD-10-CM

## 2014-10-04 DIAGNOSIS — Z7901 Long term (current) use of anticoagulants: Secondary | ICD-10-CM

## 2014-10-04 DIAGNOSIS — Z9221 Personal history of antineoplastic chemotherapy: Secondary | ICD-10-CM

## 2014-10-04 DIAGNOSIS — R5383 Other fatigue: Secondary | ICD-10-CM

## 2014-10-04 DIAGNOSIS — Z923 Personal history of irradiation: Secondary | ICD-10-CM

## 2014-10-04 LAB — COMPREHENSIVE METABOLIC PANEL
ALT: 48 U/L (ref 17–63)
AST: 18 U/L (ref 15–41)
Albumin: 2.8 g/dL — ABNORMAL LOW (ref 3.5–5.0)
Alkaline Phosphatase: 131 U/L — ABNORMAL HIGH (ref 38–126)
Anion gap: 4 — ABNORMAL LOW (ref 5–15)
BUN: 10 mg/dL (ref 6–20)
CHLORIDE: 96 mmol/L — AB (ref 101–111)
CO2: 31 mmol/L (ref 22–32)
CREATININE: 0.81 mg/dL (ref 0.61–1.24)
Calcium: 9.1 mg/dL (ref 8.9–10.3)
GFR calc non Af Amer: >60 mL/min (ref >60)
GLUCOSE: 130 mg/dL — AB (ref 65–99)
POTASSIUM: 3.2 mmol/L — AB (ref 3.5–5.1)
Sodium: 131 mmol/L — ABNORMAL LOW (ref 135–145)
TOTAL PROTEIN: 5.9 g/dL — AB (ref 6.5–8.1)
Total Bilirubin: 0.4 mg/dL (ref 0.3–1.2)

## 2014-10-04 LAB — CBC WITH DIFFERENTIAL/PLATELET
BASOS ABS: 0 10*3/uL (ref 0–0.1)
Eosinophils Absolute: 0.1 10*3/uL (ref 0–0.7)
HCT: 36.6 % — ABNORMAL LOW (ref 40.0–52.0)
Hemoglobin: 11.6 g/dL — ABNORMAL LOW (ref 13.0–18.0)
Lymphs Abs: 0.5 10*3/uL — ABNORMAL LOW (ref 1.0–3.6)
MCH: 29.4 pg (ref 26.0–34.0)
MCHC: 31.8 g/dL — AB (ref 32.0–36.0)
MCV: 92.3 fL (ref 80.0–100.0)
MONO ABS: 0.2 10*3/uL (ref 0.2–1.0)
Monocytes Relative: 2 %
NEUTROS ABS: 9.9 10*3/uL — AB (ref 1.4–6.5)
Neutrophils Relative %: 92 %
Platelets: 312 10*3/uL (ref 150–440)
RBC: 3.96 MIL/uL — AB (ref 4.40–5.90)
RDW: 24.1 % — ABNORMAL HIGH (ref 11.5–14.5)
WBC: 10.7 10*3/uL — AB (ref 3.8–10.6)

## 2014-10-04 IMAGING — CT CT NECK-CHEST W/ CM
2 series · 10 of 14 positions shown, 12 images · IV contrast (agent unspecified)
Comparison: none

REASON FOR EXAM: hist left neck mass hist Ca
COMMENTS:

PROCEDURE:     KCT - KCT NECK AND CHEST WITH CONTRAST  - August 07, 2012 [DATE]
RESULT:     History: Swelling.
Comparison Study: Prior CT of 08/06/2011. Prior PET/CT of 06/04/2012.

[Series 2: chest w/ 3.0 i31f 2 · axial · 0.82mm/px · z∈[+204,+606]mm · 7 of 180 slices shown, 9 images]
[im 23/180  soft-tissue]
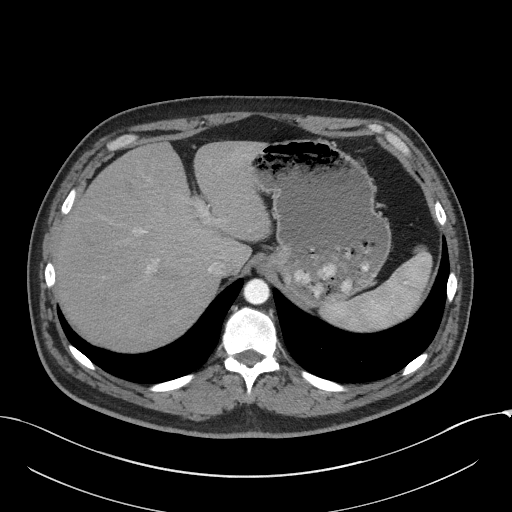
[im 23/180  bone]
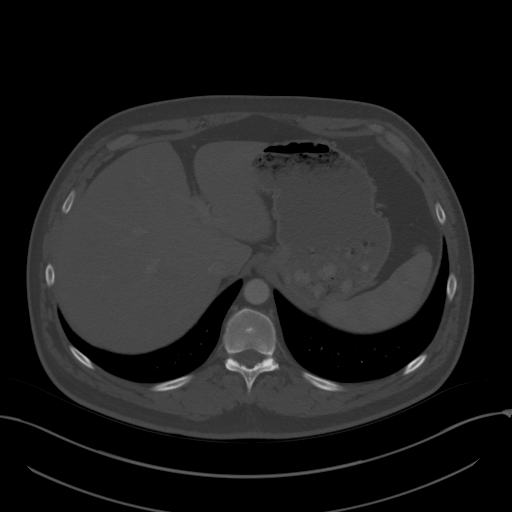
[im 45/180  bone]
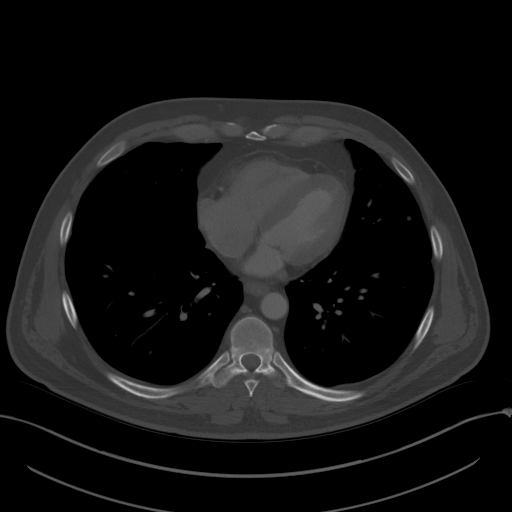
[im 68/180  bone]
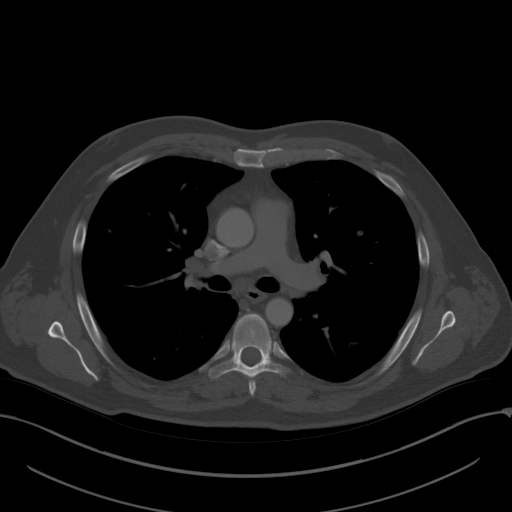
[im 90/180  bone]
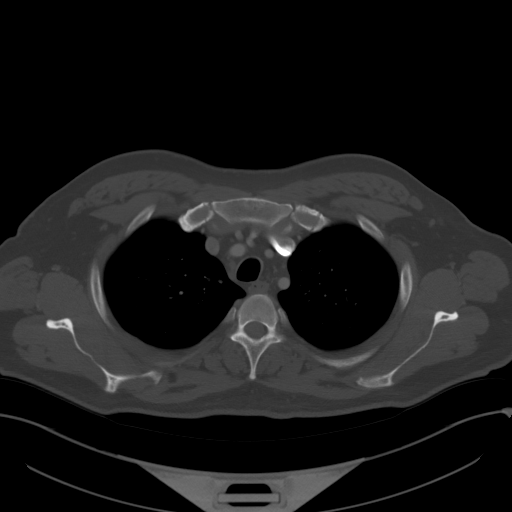
[im 112/180  soft-tissue]
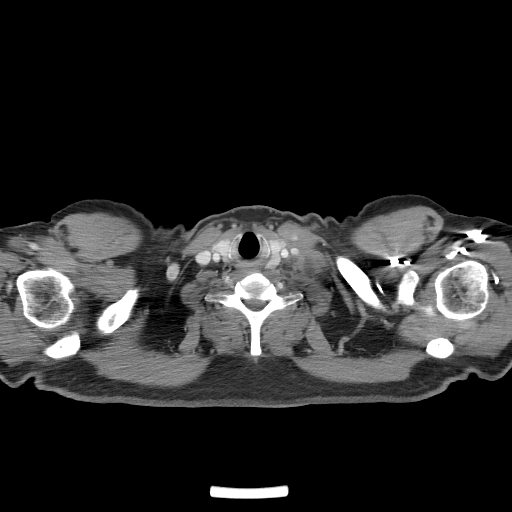
[im 112/180  bone]
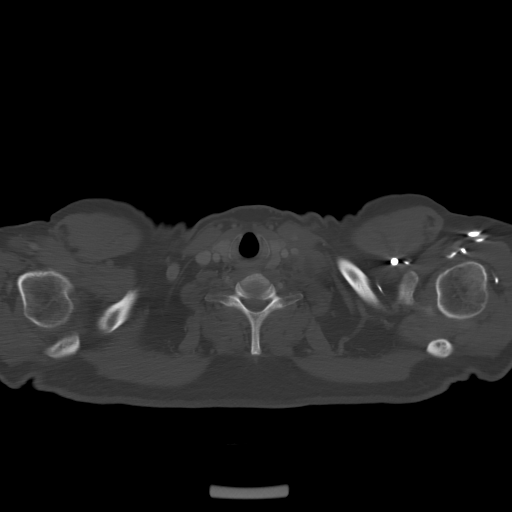
[im 135/180  bone]
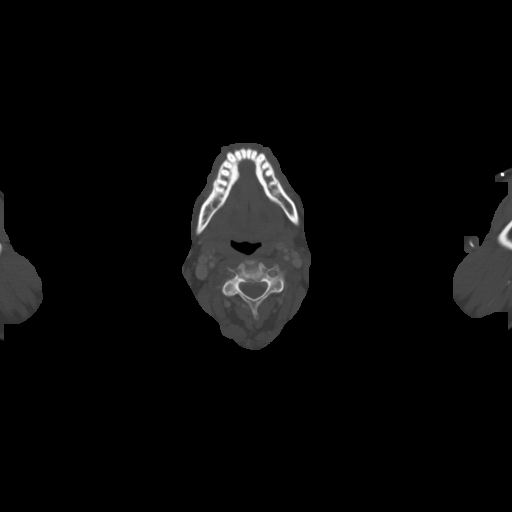
[im 157/180  bone]
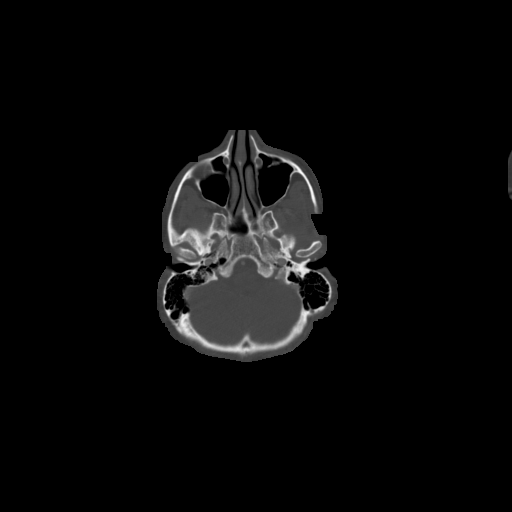

[Series 4: lung · axial · 0.82mm/px · z∈[+221,+389]mm · 3 of 114 slices shown]
[im 29/114  bone]
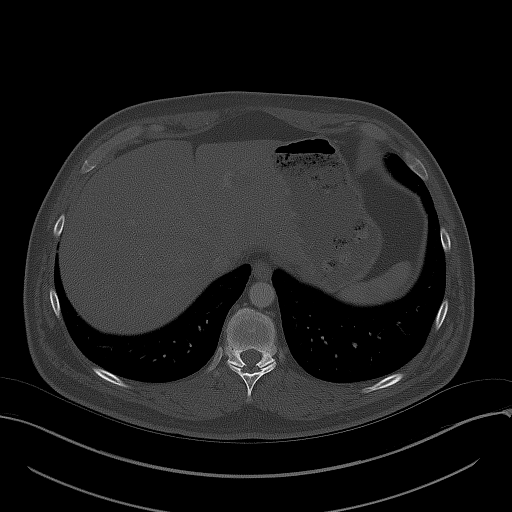
[im 57/114  bone]
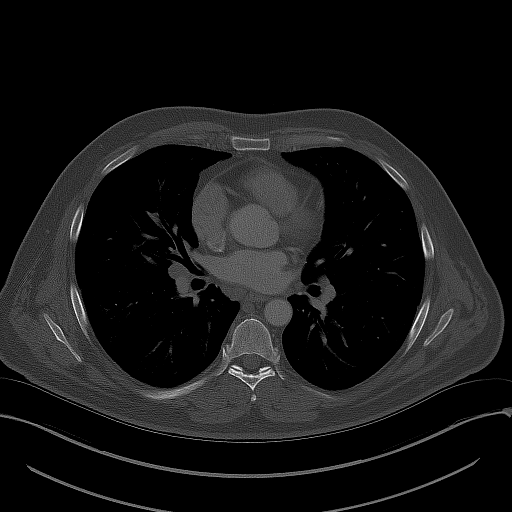
[im 85/114  bone]
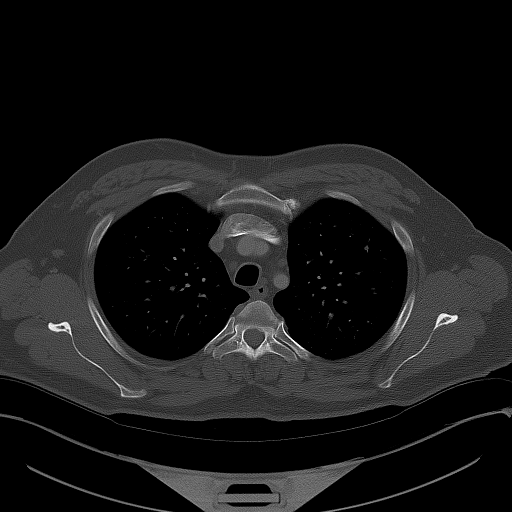

[10 of 14 positions shown; findings below may reference images not displayed]

FINDINGS: Standard CT obtained 6 cc of [id number]. Skull base normal. Are
normal. Paranasal sinuses are clear. Mastoids are clear. Right glands are
normal. Postsurgical changes are noted a left neck. There is mild soft
tissue fullness of the base of the left tongue for which direct
visualization suggested. 2.3 cm cystic mass is noted in the left lower neck
slice supraclavicular region. This is worrisome for lymphadenopathy. Small
infectious fluid collection in this region cannot be entirely excluded by CT
appearance. Right lower cervical adenopathy is present. Largest lymph node
measures 1.6 cm and is concerning for metastatic disease. Larynx is normal.
Thyroid is normal. Multiple mediastinal, subcarinal, and bilateral hilar
lymph nodes are noted. The largest measures 1.7 cm. These are most likely
malignant lymph nodes. Thoracic aorta is normal. Adrenals are normal. Low
density lesion measuring 2.3 cm present in the peripheral right lobe of
liver. This has nodular peripheral enhancement most likely benign
hemangioma. MRI can be obtained for confirmation. A similar finding is noted
in the left hepatic lobe pelvis measures 1.60 centimeters.   Pulmonary
arteries are normal. Large airways patent. Innumerable tiny bilateral
pulmonary nodules are present. These are most consistent with metastatic
disease.
IMPRESSION: 1. Findings consistent with cervical, mediastinal, and hilar adenopathy.
Multiple innumerable pulmonary nodules noted. These findings are almost
consistent with metastatic disease.
2. Tissue fullness at the left tongue base base ; postoperative changes left
neck.
3. Two hepatic lesions most likely hemangiomas, MRI of the liver should be
considered for further evaluation.

## 2014-10-04 MED ORDER — PREGABALIN 75 MG PO CAPS: 75.0000 mg | ORAL_CAPSULE | Freq: Two times a day (BID) | ORAL | Status: DC

## 2014-10-04 MED ORDER — METHOTREXATE SODIUM (PF) CHEMO INJECTION 250 MG/10ML
65.0000 mg | Freq: Once | INTRAMUSCULAR | Status: AC
  Administered 2014-10-04: 65 mg via INTRAVENOUS
  Filled 2014-10-04: qty 2.6

## 2014-10-04 MED ORDER — ONDANSETRON HCL 40 MG/20ML IJ SOLN
Freq: Once | INTRAMUSCULAR | Status: AC
  Administered 2014-10-04: 18 mg via INTRAVENOUS
  Filled 2014-10-04: qty 4

## 2014-10-05 ENCOUNTER — Encounter: Payer: Self-pay | Admitting: Oncology

## 2014-10-05 NOTE — Progress Notes (Signed)
[place] @ [facility] [phone]  Fax:([phone]) [place] OB: 12-10-1965  MR#: [id number]  XYD#:[id number]  Patient Care Team: Crecencio Mc, MD as PCP - General (Internal Medicine)  CHIEF COMPLAINT:  Chief Complaint  Patient presents with  . Follow-up    Oncology History   59. 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 Margins were clear but close at 5 mm. Depth of invasion was 0.9 cm. There was no lymph vascular spread there was positive perineural spread. 22 nodes were at excised all negative for metastatic disease). 2. Mediastinal/hilar Adenopathy on PET scan - EBUS procedure and biopsy at [facility] on 09/02/2011 negative for malignancy.  Also RLL lung biopsy at [facility] on 08/30/2011 negative for malignancy. 3. Patient had lower left cervical lymph node removed.  (Surgery was done at [facility]) followed by introperative radiation therapy.(May of 2014) 4  Ebus in May of 2014 insufficient tissue 5. 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 6. Suspected Recurrent disease in the neck patient was started on cis-platinum, cetuximab and radiation therapy July17, 2014 7. Deep Vein  thromboses in the left lower extremity on Xaralto 8. Finished radiation and chemotherapy December 13, 2012 9. Abnormal CT scan and a PET scan.  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 Patient started on 5-FU , Cis-platinum, docetaxel from February 11, 2013 has finished total  5  cycles of chemotherapy. 10. Progressing disease.  Started on palliative radiation treatment with cetuximab.  (February, 201 5) 11. Finished radiation then cetuximab in April of 2015. 12. On carboplatinum and Taxol  and cetuximab 13 Started on maintenance  cetuimab from  January 21, 2014 14.progressive disease so started on  keytruda from March 15, 2014 15.ecent episode of thrombosis of left subclavian, left axillary veinpatient underwent thrombolytic therapy.  Removal of port.  (Because of infection)and started on Lovenox. 16.progressive disease on KEYTRUDA.  Palliative radiation therapy 17.palliative radiation therapy,march 201 18.  On IV methotrexate starting from April of 2016     Primary cancer of tongue   08/26/2012 Initial Diagnosis Primary cancer of tongue    Oncology Flowsheet 09/14/2014 09/26/2014 09/26/2014 09/27/2014 10/04/2014  Day, Cycle Day 1, Cycle 1 - - Day 1, Cycle 3 Day 1, Cycle 1  dexamethasone (DECADRON) IV [ 10 mg ] - - [ 10 mg ] [ 10 mg ]  dexamethasone (DECADRON) PO - 8 mg   - -  enoxaparin (LOVENOX) Harrell - 80 mg 80 mg - -  methotrexate (PF) IV 30 mg/m2 - - 30 mg/m2 65 mg  ondansetron (ZOFRAN) IV [ 8 mg ] - - [ 8 mg ] [ 8 mg ]    INTERVAL HISTORY: 49 year old gentleman with carcinoma of head and neck stage IV disease.  Does not have any pain in lower extremity.  Had a bone scan done which has been reviewed independently.  Shows increase uptake in unknown metastatic  site in clavicle.  And sternum.  But no evidence of metastases to the spine. Pain in the left upper chest for diarrhea continues.  Here for further follow-up and treatment consideration with methotrexate REVIEW OF SYSTEMS:   Gen. status: Patient is weak tired.  Declining performance status.  HEENT: Continuing drainage from the left side of the neck  and chest.  Lungs: Increasing shortness of breath.  GI: No nausea.  No vomiting.  No diarrhea.  Lower extremity: Pain below both knees crams tenderness tingling numbness.  Lower extremity no swelling.  Neurological system: No headache no dizziness.  No weakness in lower extremity.  Cardiac: No chest pain.  No palpitations.  Musculoskeletal system no bony pains. All other systems have been reviewed Lower extremity bony pains have  improved  As per HPI. Otherwise, a complete review of systems is negatve.  PAST MEDICAL HISTORY: Past Medical History  Diagnosis Date  . Allergy   . Cancer     tongue and neck    PAST SURGICAL HISTORY: Past Surgical History  Procedure Laterality Date  . Excision of tongue lesion with laser  08/13/11  . Neck dissection Bilateral 08/13/11  . Thoracotomy      FAMILY HISTORY Family History  Problem Relation Age of Onset  . Cancer Father 35    prostate          ADVANCED DIRECTIVES: Advance care directive: Patient does have advanced healthcare directive and has been reviewed.  Patient does not want to change it at present time    HEALTH MAINTENANCE: History  Substance Use Topics  . Smoking status: Never Smoker   . Smokeless tobacco: Not on file  . Alcohol Use: No     Comment: very limited     Colonoscopy:  PAP:  Bone density:  Lipid panel:  Allergies  Allergen Reactions  . Azithromycin Other (See Comments)    Pt states he gets real flushed red and sweaty.  . Penicillins Hives    Childhood reaction reported to patient by mother.  . Sulfa Antibiotics     Childhood reactions  . Erythromycin Base Swelling    Current Outpatient Prescriptions  Medication Sig Dispense Refill  . azelastine (ASTELIN) 0.1 % nasal spray Place 1 spray into the nose 2 (two) times daily as needed. For allergies    . calcium-vitamin D (OSCAL WITH D) 500-200 MG-UNIT per tablet Take 1 tablet by mouth 2 (two) times daily. 60 tablet 3  . chlorhexidine (PERIDEX) 0.12 % solution Use as directed 15 mLs in the mouth or throat 2 (two) times daily.    . citalopram (CELEXA) 20 MG tablet Take 1 tablet (20 mg total) by mouth at bedtime. 30 tablet 2  . dexamethasone (DECADRON) 4 MG tablet Take 2 tablets (8 mg total) by mouth daily. 30 tablet 0  . docusate sodium (COLACE) 100 MG capsule Take 100 mg by mouth daily as needed. for constipation.    Marland Kitchen doxycycline (VIBRA-TABS) 100 MG tablet Take 100 mg by  mouth 2 (two) times daily.    Marland Kitchen enoxaparin (LOVENOX) 80 MG/0.8ML injection Inject 80 mg into the skin 2 (two) times daily.    . fentaNYL (DURAGESIC - DOSED MCG/HR) 100 MCG/HR Place 1 patch (100 mcg total) onto the skin every other day. 15 patch 0  . fentaNYL (DURAGESIC - DOSED MCG/HR) 75 MCG/HR Place 1 patch (75 mcg total) onto the skin every other day. 15 patch 0  . fluticasone (FLONASE) 50 MCG/ACT nasal spray Place 2 sprays into the nose daily as needed. for allergies    . omeprazole (PRILOSEC OTC) 20 MG tablet Take 20 mg by mouth every morning.    Marland Kitchen oxyCODONE (ROXICODONE) 15 MG immediate release tablet Take 1-2 tablets (15-30 mg total) by mouth every 6 (six) hours as needed for pain. 120 tablet 0  . ALPRAZolam (XANAX) 0.25 MG  tablet Take 1 tablet (0.25 mg total) by mouth 2 (two) times daily as needed for sleep or anxiety. (Patient not taking: Reported on 10/04/2014) 30 tablet 2  . pregabalin (LYRICA) 75 MG capsule Take 1 capsule (75 mg total) by mouth 2 (two) times daily. 60 capsule 3   No current facility-administered medications for this visit.    OBJECTIVE:  Filed Vitals:   10/04/14 0839  BP: 108/76  Pulse: 98  Temp: 96 F (35.6 C)     Body mass index is 23.19 kg/(m^2).    ECOG FS:2 - Symptomatic, <50% confined to bed  PHYSICAL EXAM: Gen. status: Performance status is declining HEENT increasing pain and swelling on the left side of the neck redness.  Lungs: Air entry on both sides no palpitation or rhonchi no rales.  Abdomen: Soft liver and spleen not enlarged.  Cardiac: Tachycardia skin: No rash.  Neurological system:higher functions within normal limit.  Motor and sensory system in both upper and lower extremities within normal limit.  Tenderness in both calf muscles.  Musculoskeletal system no joint swelling.  No joint tenderness. Psychiatric system: Patient is somewhat depressed The neck area continues to show progressive swelling   LAB RESULTS:  Infusion on 10/04/2014    Component Date Value Ref Range Status  . Sodium 10/04/2014 131* 135 - 145 mmol/L Final  . Potassium 10/04/2014 3.2* 3.5 - 5.1 mmol/L Final  . Chloride 10/04/2014 96* 101 - 111 mmol/L Final  . CO2 10/04/2014 31  22 - 32 mmol/L Final  . Glucose, Bld 10/04/2014 130* 65 - 99 mg/dL Final  . BUN 10/04/2014 10  6 - 20 mg/dL Final  . Creatinine, Ser 10/04/2014 0.81  0.61 - 1.24 mg/dL Final  . Calcium 10/04/2014 9.1  8.9 - 10.3 mg/dL Final  . Total Protein 10/04/2014 5.9* 6.5 - 8.1 g/dL Final  . Albumin 10/04/2014 2.8* 3.5 - 5.0 g/dL Final  . AST 10/04/2014 18  15 - 41 U/L Final  . ALT 10/04/2014 48  17 - 63 U/L Final  . Alkaline Phosphatase 10/04/2014 131* 38 - 126 U/L Final  . Total Bilirubin 10/04/2014 0.4  0.3 - 1.2 mg/dL Final  . GFR calc non Af Amer 10/04/2014 >60  >60 mL/min Final  . GFR calc Af Amer 10/04/2014 >60  >60 mL/min Final   Comment: (NOTE) The eGFR has been calculated using the CKD EPI equation. This calculation has not been validated in all clinical situations. eGFR's persistently <60 mL/min signify possible Chronic Kidney Disease.   . Anion gap 10/04/2014 4* 5 - 15 Final  . WBC 10/04/2014 10.7* 3.8 - 10.6 K/uL Final  . RBC 10/04/2014 3.96* 4.40 - 5.90 MIL/uL Final  . Hemoglobin 10/04/2014 11.6* 13.0 - 18.0 g/dL Final  . HCT 10/04/2014 36.6* 40.0 - 52.0 % Final  . MCV 10/04/2014 92.3  80.0 - 100.0 fL Final  . MCH 10/04/2014 29.4  26.0 - 34.0 pg Final  . MCHC 10/04/2014 31.8* 32.0 - 36.0 g/dL Final  . RDW 10/04/2014 24.1* 11.5 - 14.5 % Final  . Platelets 10/04/2014 312  150 - 440 K/uL Final  . Neutrophils Relative % 10/04/2014 92%   Final  . Neutro Abs 10/04/2014 9.9* 1.4 - 6.5 K/uL Final  . Lymphocytes Relative 10/04/2014 5%   Final  . Lymphs Abs 10/04/2014 0.5* 1.0 - 3.6 K/uL Final  . Monocytes Relative 10/04/2014 2%   Final  . Monocytes Absolute 10/04/2014 0.2  0.2 - 1.0 K/uL Final  . Eosinophils Relative  10/04/2014 1%   Final  . Eosinophils Absolute 10/04/2014  0.1  0 - 0.7 K/uL Final  . Basophils Relative 10/04/2014 0%   Final  . Basophils Absolute 10/04/2014 0.0  0 - 0.1 K/uL Final     STUDIES: Dg Chest 2 View  09/25/2014   CLINICAL DATA:  Productive cough for several days, worsening. Increased weakness. History of head and neck cancer. Nonhealing ulcers in the left neck and sternum.  EXAM: CHEST  2 VIEW  COMPARISON:  CT chest 07/27/2014.  Chest 10/10/2013.  FINDINGS: Shallow inspiration. Linear atelectasis in the left lung base. Heart size and pulmonary vascularity are normal for technique. No focal airspace disease or consolidation in the lungs. Somewhat a amorphous shadows are projected over the left upper chest, likely corresponding to the chest wall mass seen on previous CT. Surgical clips in the left side of the neck.  IMPRESSION: No active cardiopulmonary disease.   Electronically Signed   By: Lucienne Capers M.D.   On: 09/25/2014 23:09   Nm Bone Scan Whole Body  09/29/2014   CLINICAL DATA:  Tongue cancer. Back pain. Left arm and shoulder pain.  EXAM: NUCLEAR MEDICINE WHOLE BODY BONE SCAN  TECHNIQUE: Whole body anterior and posterior images were obtained approximately 3 hours after intravenous injection of radiopharmaceutical.  RADIOPHARMACEUTICALS:  24 mCi Technetium-58mMDP IV  COMPARISON:  PET-CT 05/31/2014 .  FINDINGS: Increased activity noted in the medial left clavicle, manubrium, and sternum. Similar findings noted on recent PET-CT. These findings suggest metastatic disease. No other focal abnormalities noted to suggest metastatic disease.  IMPRESSION: Intense increased activity noted in the medial left clavicle, manubrium, and sternum consistent with the possibility of metastatic disease. Similar findings noted on recent PET-CT. No other focal abnormalities identified.   Electronically Signed   By: TMarcello Moores Register   On: 09/29/2014 13:01    ASSESSMENT: 49year old gentleman with stage IV and recurrent and progressive carcinoma of tongue    on methotrexate. Lower extremity pain etiology not clear. Scan has been reviewed independently and is been negative for any metastases  Anemia. Continue Decadron 8 mg daily MEDICAL DECISION MAKING:  All lab data has been reviewed.  Patient's last hospitalization records have been reviewed increase Decadron to 8 mg daily On scan has been reviewed independently.  Other than metastases to the clavicle and sternum there is no other evidence ofmetastatic disease in the bone. Neurontin has been discontinued and patient has been started on Lyrica 75 mg twice a day for neck pain    Patient expressed understanding and was in agreement with this plan. He also understands that He can call clinic at any time with any questions, concerns, or complaints.    Primary cancer of tongue   Staging form: Lip and Oral Cavity, AJCC 7th Edition     Clinical: Stage IVC (T2, N2, M1) - Unsigned   JForest Gleason MD   10/05/2014 8:24 AM

## 2014-10-07 ENCOUNTER — Telehealth: Payer: Self-pay | Admitting: *Deleted

## 2014-10-07 NOTE — Telephone Encounter (Signed)
Would like return to work note for June 2. Patient informed to pick up letter at registration this afternoon.

## 2014-10-08 NOTE — Telephone Encounter (Signed)
Called pt to inform him letter is available for pick up at registration desk.  Verbalized understanding.

## 2014-10-12 ENCOUNTER — Ambulatory Visit: Payer: BC Managed Care – PPO | Admitting: Oncology

## 2014-10-12 ENCOUNTER — Ambulatory Visit: Payer: BC Managed Care – PPO

## 2014-10-12 ENCOUNTER — Other Ambulatory Visit: Payer: BC Managed Care – PPO

## 2014-10-12 ENCOUNTER — Inpatient Hospital Stay (HOSPITAL_BASED_OUTPATIENT_CLINIC_OR_DEPARTMENT_OTHER): Payer: BC Managed Care – PPO | Admitting: Oncology

## 2014-10-12 ENCOUNTER — Inpatient Hospital Stay: Payer: BC Managed Care – PPO

## 2014-10-12 VITALS — BP 96/72 | HR 94 | Temp 97.0°F | Ht 73.0 in | Wt 174.0 lb

## 2014-10-12 DIAGNOSIS — C029 Malignant neoplasm of tongue, unspecified: Secondary | ICD-10-CM | POA: Diagnosis not present

## 2014-10-12 DIAGNOSIS — Z86718 Personal history of other venous thrombosis and embolism: Secondary | ICD-10-CM

## 2014-10-12 DIAGNOSIS — Z9221 Personal history of antineoplastic chemotherapy: Secondary | ICD-10-CM

## 2014-10-12 DIAGNOSIS — Z923 Personal history of irradiation: Secondary | ICD-10-CM

## 2014-10-12 DIAGNOSIS — C770 Secondary and unspecified malignant neoplasm of lymph nodes of head, face and neck: Secondary | ICD-10-CM

## 2014-10-12 DIAGNOSIS — C7951 Secondary malignant neoplasm of bone: Secondary | ICD-10-CM | POA: Diagnosis not present

## 2014-10-12 DIAGNOSIS — G893 Neoplasm related pain (acute) (chronic): Secondary | ICD-10-CM

## 2014-10-12 DIAGNOSIS — Z85810 Personal history of malignant neoplasm of tongue: Secondary | ICD-10-CM | POA: Diagnosis not present

## 2014-10-12 DIAGNOSIS — C021 Malignant neoplasm of border of tongue: Secondary | ICD-10-CM

## 2014-10-12 DIAGNOSIS — G629 Polyneuropathy, unspecified: Secondary | ICD-10-CM

## 2014-10-12 DIAGNOSIS — D630 Anemia in neoplastic disease: Secondary | ICD-10-CM

## 2014-10-12 DIAGNOSIS — C760 Malignant neoplasm of head, face and neck: Secondary | ICD-10-CM

## 2014-10-12 DIAGNOSIS — Z7901 Long term (current) use of anticoagulants: Secondary | ICD-10-CM

## 2014-10-12 DIAGNOSIS — R531 Weakness: Secondary | ICD-10-CM

## 2014-10-12 LAB — COMPREHENSIVE METABOLIC PANEL
ALBUMIN: 2.7 g/dL — AB (ref 3.5–5.0)
ALT: 82 U/L — ABNORMAL HIGH (ref 17–63)
ANION GAP: 7 (ref 5–15)
AST: 17 U/L (ref 15–41)
Alkaline Phosphatase: 136 U/L — ABNORMAL HIGH (ref 38–126)
BUN: 10 mg/dL (ref 6–20)
CHLORIDE: 94 mmol/L — AB (ref 101–111)
CO2: 31 mmol/L (ref 22–32)
Calcium: 8.7 mg/dL — ABNORMAL LOW (ref 8.9–10.3)
Creatinine, Ser: 0.75 mg/dL (ref 0.61–1.24)
GFR calc Af Amer: >60 mL/min (ref >60)
GFR calc non Af Amer: >60 mL/min (ref >60)
Glucose, Bld: 153 mg/dL — ABNORMAL HIGH (ref 65–99)
POTASSIUM: 4 mmol/L (ref 3.5–5.1)
SODIUM: 132 mmol/L — AB (ref 135–145)
Total Bilirubin: 0.4 mg/dL (ref 0.3–1.2)
Total Protein: 5.7 g/dL — ABNORMAL LOW (ref 6.5–8.1)

## 2014-10-12 LAB — CBC WITH DIFFERENTIAL/PLATELET
BASOS ABS: 0 10*3/uL (ref 0–0.1)
Basophils Relative: 0 %
Eosinophils Absolute: 0.1 10*3/uL (ref 0–0.7)
Eosinophils Relative: 1 %
HCT: 37.4 % — ABNORMAL LOW (ref 40.0–52.0)
Hemoglobin: 12 g/dL — ABNORMAL LOW (ref 13.0–18.0)
LYMPHS ABS: 0.2 10*3/uL — AB (ref 1.0–3.6)
LYMPHS PCT: 3 %
MCH: 30 pg (ref 26.0–34.0)
MCHC: 32 g/dL (ref 32.0–36.0)
MCV: 93.8 fL (ref 80.0–100.0)
Monocytes Absolute: 0.4 10*3/uL (ref 0.2–1.0)
Monocytes Relative: 5 %
NEUTROS ABS: 6.7 10*3/uL — AB (ref 1.4–6.5)
Neutrophils Relative %: 91 %
PLATELETS: 234 10*3/uL (ref 150–440)
RBC: 3.98 MIL/uL — ABNORMAL LOW (ref 4.40–5.90)
RDW: 25.7 % — ABNORMAL HIGH (ref 11.5–14.5)
WBC: 7.4 10*3/uL (ref 3.8–10.6)

## 2014-10-12 MED ORDER — KETOROLAC TROMETHAMINE 15 MG/ML IJ SOLN
15.0000 mg | Freq: Once | INTRAMUSCULAR | Status: AC
  Administered 2014-10-12: 15 mg via INTRAVENOUS
  Filled 2014-10-12: qty 1

## 2014-10-12 MED ORDER — FENTANYL 100 MCG/HR TD PT72: 100.0000 ug | MEDICATED_PATCH | TRANSDERMAL | Status: DC

## 2014-10-12 MED ORDER — ONDANSETRON HCL 40 MG/20ML IJ SOLN
Freq: Once | INTRAMUSCULAR | Status: AC
  Administered 2014-10-12: 10 mg via INTRAVENOUS
  Filled 2014-10-12: qty 4

## 2014-10-12 MED ORDER — FENTANYL 75 MCG/HR TD PT72: 75.0000 ug | MEDICATED_PATCH | TRANSDERMAL | Status: DC

## 2014-10-12 MED ORDER — METHOTREXATE SODIUM (PF) CHEMO INJECTION 250 MG/10ML
65.0000 mg | Freq: Once | INTRAMUSCULAR | Status: AC
  Administered 2014-10-12: 65 mg via INTRAVENOUS
  Filled 2014-10-12: qty 2.6

## 2014-10-12 MED ORDER — HYDROMORPHONE HCL 1 MG/ML IJ SOLN
1.0000 mg | Freq: Once | INTRAMUSCULAR | Status: AC
  Administered 2014-10-12: 1 mg via INTRAVENOUS
  Filled 2014-10-12: qty 1

## 2014-10-13 ENCOUNTER — Encounter: Payer: Self-pay | Admitting: Oncology

## 2014-10-13 NOTE — Progress Notes (Signed)
[place] @ [facility] [phone]  Fax:([phone]) [place]: 16-Apr-1966  MR#: [id number]  RKY#:[id number]  Patient Care Team: Crecencio Mc, MD as PCP - General (Internal Medicine)  CHIEF COMPLAINT:  Chief Complaint  Patient presents with  . Follow-up    here for a methotrexate inj.    Oncology History   67. 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 Margins were clear but close at 5 mm. Depth of invasion was 0.9 cm. There was no lymph vascular spread there was positive perineural spread. 22 nodes were at excised all negative for metastatic disease). 2. Mediastinal/hilar Adenopathy on PET scan - EBUS procedure and biopsy at [facility] on 09/02/2011 negative for malignancy.  Also RLL lung biopsy at [facility] on 08/30/2011 negative for malignancy. 3. Patient had lower left cervical lymph node removed.  (Surgery was done at [facility]) followed by introperative radiation therapy.(May of 2014) 4  Ebus in May of 2014 insufficient tissue 5. 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 6. Suspected Recurrent disease in the neck patient was started on cis-platinum, cetuximab and radiation therapy July17, 2014 7. Deep Vein  thromboses in the left lower extremity on Xaralto 8. Finished radiation and chemotherapy December 13, 2012 9. Abnormal CT scan and a PET scan.  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 Patient started on 5-FU , Cis-platinum, docetaxel from February 11, 2013 has finished total  5  cycles of chemotherapy. 10. Progressing disease.  Started on palliative radiation treatment with cetuximab.  (February, 201 5) 11. Finished radiation then cetuximab in April of 2015. 12. On carboplatinum and Taxol  and cetuximab 13 Started on  maintenance  cetuimab from January 21, 2014 14.progressive disease so started on  keytruda from March 15, 2014 15.ecent episode of thrombosis of left subclavian, left axillary veinpatient underwent thrombolytic therapy.  Removal of port.  (Because of infection)and started on Lovenox. 16.progressive disease on KEYTRUDA.  Palliative radiation therapy 17.palliative radiation therapy,march 201 18.  On IV methotrexate starting from April of 2016     Primary cancer of tongue   08/26/2012 Initial Diagnosis Primary cancer of tongue    Oncology Flowsheet 09/14/2014 09/26/2014 09/26/2014 09/27/2014 10/04/2014 10/12/2014  Day, Cycle Day 1, Cycle 1 - - Day 1, Cycle 3 Day 1, Cycle 1 Day 1, Cycle 2  dexamethasone (DECADRON) IV [ 10 mg ] - - [ 10 mg ] [ 10 mg ] [ 10 mg ]  dexamethasone (DECADRON) PO - 8 mg   - - -  enoxaparin (LOVENOX) [place] - 80 mg 80 mg - - -  methotrexate (PF) IV 30 mg/m2 - - 30 mg/m2 65 mg 65 mg  ondansetron (ZOFRAN) IV [ 8 mg ] - - [ 8 mg ] [ 8 mg ] [ 8 mg ]    INTERVAL HISTORY: 49 year old gentleman with carcinoma of head and neck stage IV disease.  Does not have any pain in lower extremity.  Had a bone scan done which has been reviewed independently.  Shows increase uptake in unknown metastatic  site in clavicle.  And sternum.  But no evidence of metastases to the spine. Pain in the left upper chest for diarrhea continues.  Here for further follow-up and treatment consideration with methotrexate. 49 year old  gentleman with recurrent head and neck cancer.  He has increasing pain in the left upper chest wall area started today.  With more drainage.  Patient is on methotrexate no soreness in the mouth REVIEW OF SYSTEMS:   Gen. status: Patient is weak tired.  Declining performance status.  HEENT: Continuing drainage from the left side of the neck and chest.  Lungs: Increasing shortness of breath.  GI: No nausea.  No vomiting.  No diarrhea.  Lower extremity: Pain below both knees crams  tenderness tingling numbness.  Lower extremity no swelling.  Neurological system: No headache no dizziness.  No weakness in lower extremity.  Cardiac: No chest pain.  No palpitations.  Musculoskeletal system no bony pains. All other systems have been reviewed Lower extremity bony pains have improved  As per HPI. Otherwise, a complete review of systems is negatve.  PAST MEDICAL HISTORY: Past Medical History  Diagnosis Date  . Allergy   . Cancer     tongue and neck    PAST SURGICAL HISTORY: Past Surgical History  Procedure Laterality Date  . Excision of tongue lesion with laser  08/13/11  . Neck dissection Bilateral 08/13/11  . Thoracotomy      FAMILY HISTORY Family History  Problem Relation Age of Onset  . Cancer Father 23    prostate          ADVANCED DIRECTIVES: Advance care directive: Patient does have advanced healthcare directive and has been reviewed.  Patient does not want to change it at present time    HEALTH MAINTENANCE: History  Substance Use Topics  . Smoking status: Never Smoker   . Smokeless tobacco: Not on file  . Alcohol Use: No     Comment: very limited     Colonoscopy:  PAP:  Bone density:  Lipid panel:  Allergies  Allergen Reactions  . Azithromycin Other (See Comments)    Pt states he gets real flushed red and sweaty.  . Penicillins Hives    Childhood reaction reported to patient by mother.  . Sulfa Antibiotics     Childhood reactions  . Erythromycin Base Swelling    Current Outpatient Prescriptions  Medication Sig Dispense Refill  . azelastine (ASTELIN) 0.1 % nasal spray Place 1 spray into the nose 2 (two) times daily as needed. For allergies    . calcium-vitamin D (OSCAL WITH D) 500-200 MG-UNIT per tablet Take 1 tablet by mouth 2 (two) times daily. 60 tablet 3  . chlorhexidine (PERIDEX) 0.12 % solution Use as directed 15 mLs in the mouth or throat 2 (two) times daily.    . citalopram (CELEXA) 20 MG tablet Take 1 tablet (20 mg  total) by mouth at bedtime. 30 tablet 2  . dexamethasone (DECADRON) 4 MG tablet Take 2 tablets (8 mg total) by mouth daily. 30 tablet 0  . docusate sodium (COLACE) 100 MG capsule Take 100 mg by mouth daily as needed. for constipation.    Marland Kitchen doxycycline (VIBRA-TABS) 100 MG tablet Take 100 mg by mouth 2 (two) times daily.    Marland Kitchen enoxaparin (LOVENOX) 80 MG/0.8ML injection Inject 80 mg into the skin 2 (two) times daily.    . fentaNYL (DURAGESIC - DOSED MCG/HR) 100 MCG/HR Place 1 patch (100 mcg total) onto the skin every other day. 15 patch 0  . fentaNYL (DURAGESIC - DOSED MCG/HR) 75 MCG/HR Place 1 patch (75 mcg total) onto the skin every other day. 15 patch 0  . fluticasone (FLONASE) 50 MCG/ACT nasal spray Place 2 sprays  into the nose daily as needed. for allergies    . omeprazole (PRILOSEC OTC) 20 MG tablet Take 20 mg by mouth every morning.    Marland Kitchen oxyCODONE (ROXICODONE) 15 MG immediate release tablet Take 1-2 tablets (15-30 mg total) by mouth every 6 (six) hours as needed for pain. 120 tablet 0  . pregabalin (LYRICA) 75 MG capsule Take 1 capsule (75 mg total) by mouth 2 (two) times daily. 60 capsule 3   No current facility-administered medications for this visit.    OBJECTIVE:  Filed Vitals:   10/12/14 1447  BP: 96/72  Pulse: 94  Temp: 97 F (36.1 C)     Body mass index is 22.96 kg/(m^2).    ECOG FS:2 - Symptomatic, <50% confined to bed  PHYSICAL EXAM: Gen. status: Performance status is declining HEENT increasing pain and swelling on the left side of the neck redness.  Lungs: Air entry on both sides no palpitation or rhonchi no rales.  Abdomen: Soft liver and spleen not enlarged.  Cardiac: Tachycardia skin: No rash.  Neurological system:higher functions within normal limit.  Motor and sensory system in both upper and lower extremities within normal limit.  Tenderness in both calf muscles.  Musculoskeletal system no joint swelling.  No joint tenderness. Psychiatric system: Patient is somewhat  depressed The neck area continues to show progressive swelling   LAB RESULTS:  Appointment on 10/12/2014  Component Date Value Ref Range Status  . WBC 10/12/2014 7.4  3.8 - 10.6 K/uL Final  . RBC 10/12/2014 3.98* 4.40 - 5.90 MIL/uL Final  . Hemoglobin 10/12/2014 12.0* 13.0 - 18.0 g/dL Final  . HCT 10/12/2014 37.4* 40.0 - 52.0 % Final  . MCV 10/12/2014 93.8  80.0 - 100.0 fL Final  . MCH 10/12/2014 30.0  26.0 - 34.0 pg Final  . MCHC 10/12/2014 32.0  32.0 - 36.0 g/dL Final  . RDW 10/12/2014 25.7* 11.5 - 14.5 % Final  . Platelets 10/12/2014 234  150 - 440 K/uL Final  . Neutrophils Relative % 10/12/2014 91   Final  . Neutro Abs 10/12/2014 6.7* 1.4 - 6.5 K/uL Final  . Lymphocytes Relative 10/12/2014 3   Final  . Lymphs Abs 10/12/2014 0.2* 1.0 - 3.6 K/uL Final  . Monocytes Relative 10/12/2014 5   Final  . Monocytes Absolute 10/12/2014 0.4  0.2 - 1.0 K/uL Final  . Eosinophils Relative 10/12/2014 1   Final  . Eosinophils Absolute 10/12/2014 0.1  0 - 0.7 K/uL Final  . Basophils Relative 10/12/2014 0   Final  . Basophils Absolute 10/12/2014 0.0  0 - 0.1 K/uL Final  . Sodium 10/12/2014 132* 135 - 145 mmol/L Final  . Potassium 10/12/2014 4.0  3.5 - 5.1 mmol/L Final  . Chloride 10/12/2014 94* 101 - 111 mmol/L Final  . CO2 10/12/2014 31  22 - 32 mmol/L Final  . Glucose, Bld 10/12/2014 153* 65 - 99 mg/dL Final  . BUN 10/12/2014 10  6 - 20 mg/dL Final  . Creatinine, Ser 10/12/2014 0.75  0.61 - 1.24 mg/dL Final  . Calcium 10/12/2014 8.7* 8.9 - 10.3 mg/dL Final  . Total Protein 10/12/2014 5.7* 6.5 - 8.1 g/dL Final  . Albumin 10/12/2014 2.7* 3.5 - 5.0 g/dL Final  . AST 10/12/2014 17  15 - 41 U/L Final  . ALT 10/12/2014 82* 17 - 63 U/L Final  . Alkaline Phosphatase 10/12/2014 136* 38 - 126 U/L Final  . Total Bilirubin 10/12/2014 0.4  0.3 - 1.2 mg/dL Final  . GFR calc  non Af Amer 10/12/2014 >60  >60 mL/min Final  . GFR calc Af Amer 10/12/2014 >60  >60 mL/min Final   Comment: (NOTE) The eGFR has  been calculated using the CKD EPI equation. This calculation has not been validated in all clinical situations. eGFR's persistently <60 mL/min signify possible Chronic Kidney Disease.   . Anion gap 10/12/2014 7  5 - 15 Final     STUDIES: Dg Chest 2 View  09/25/2014   CLINICAL DATA:  Productive cough for several days, worsening. Increased weakness. History of head and neck cancer. Nonhealing ulcers in the left neck and sternum.  EXAM: CHEST  2 VIEW  COMPARISON:  CT chest 07/27/2014.  Chest 10/10/2013.  FINDINGS: Shallow inspiration. Linear atelectasis in the left lung base. Heart size and pulmonary vascularity are normal for technique. No focal airspace disease or consolidation in the lungs. Somewhat a amorphous shadows are projected over the left upper chest, likely corresponding to the chest wall mass seen on previous CT. Surgical clips in the left side of the neck.  IMPRESSION: No active cardiopulmonary disease.   Electronically Signed   By: Lucienne Capers M.D.   On: 09/25/2014 23:09   Nm Bone Scan Whole Body  09/29/2014   CLINICAL DATA:  Tongue cancer. Back pain. Left arm and shoulder pain.  EXAM: NUCLEAR MEDICINE WHOLE BODY BONE SCAN  TECHNIQUE: Whole body anterior and posterior images were obtained approximately 3 hours after intravenous injection of radiopharmaceutical.  RADIOPHARMACEUTICALS:  24 mCi Technetium-62mMDP IV  COMPARISON:  PET-CT 05/31/2014 .  FINDINGS: Increased activity noted in the medial left clavicle, manubrium, and sternum. Similar findings noted on recent PET-CT. These findings suggest metastatic disease. No other focal abnormalities noted to suggest metastatic disease.  IMPRESSION: Intense increased activity noted in the medial left clavicle, manubrium, and sternum consistent with the possibility of metastatic disease. Similar findings noted on recent PET-CT. No other focal abnormalities identified.   Electronically Signed   By: TMarcello Moores Register   On: 09/29/2014 13:01     ASSESSMENT: 49year old gentleman with stage IV and recurrent and progressive carcinoma of tongue   on methotrexate. Lower extremity pain etiology not clear. Scan has been reviewed independently and is been negative for any metastases  Anemia. Continue Decadron 8 mg daily MEDICAL DECISION MAKING:  All lab data has been reviewed.  Patient's last hospitalization records have been reviewed increase Decadron to 8 mg daily On scan has been reviewed independently.  Other than metastases to the clavicle and sternum there is no other evidence ofmetastatic disease in the bone. Neurontin has been discontinued and patient has been started on Lyrica 75 mg twice a day for neck pain    Patient expressed understanding and was in agreement with this plan. He also understands that He can call clinic at any time with any questions, concerns, or complaints.    Primary cancer of tongue   Staging form: Lip and Oral Cavity, AJCC 7th Edition     Clinical: Stage IVC (T2, N2, M1) - Unsigned   JForest Gleason MD   10/13/2014 9:51 PM

## 2014-10-15 ENCOUNTER — Telehealth: Payer: Self-pay | Admitting: *Deleted

## 2014-10-15 ENCOUNTER — Inpatient Hospital Stay: Payer: BC Managed Care – PPO | Attending: Oncology | Admitting: Family Medicine

## 2014-10-15 ENCOUNTER — Ambulatory Visit: Payer: BC Managed Care – PPO

## 2014-10-15 VITALS — BP 104/73 | HR 94 | Temp 96.0°F | Resp 18 | Wt 174.8 lb

## 2014-10-15 DIAGNOSIS — Z5111 Encounter for antineoplastic chemotherapy: Secondary | ICD-10-CM | POA: Diagnosis not present

## 2014-10-15 DIAGNOSIS — R5383 Other fatigue: Secondary | ICD-10-CM | POA: Insufficient documentation

## 2014-10-15 DIAGNOSIS — D649 Anemia, unspecified: Secondary | ICD-10-CM | POA: Insufficient documentation

## 2014-10-15 DIAGNOSIS — Z7901 Long term (current) use of anticoagulants: Secondary | ICD-10-CM | POA: Insufficient documentation

## 2014-10-15 DIAGNOSIS — Z9221 Personal history of antineoplastic chemotherapy: Secondary | ICD-10-CM | POA: Insufficient documentation

## 2014-10-15 DIAGNOSIS — C7951 Secondary malignant neoplasm of bone: Secondary | ICD-10-CM | POA: Diagnosis not present

## 2014-10-15 DIAGNOSIS — R197 Diarrhea, unspecified: Secondary | ICD-10-CM | POA: Diagnosis not present

## 2014-10-15 DIAGNOSIS — Z86718 Personal history of other venous thrombosis and embolism: Secondary | ICD-10-CM | POA: Diagnosis not present

## 2014-10-15 DIAGNOSIS — M7989 Other specified soft tissue disorders: Secondary | ICD-10-CM | POA: Diagnosis not present

## 2014-10-15 DIAGNOSIS — R0789 Other chest pain: Secondary | ICD-10-CM | POA: Insufficient documentation

## 2014-10-15 DIAGNOSIS — R220 Localized swelling, mass and lump, head: Secondary | ICD-10-CM | POA: Diagnosis not present

## 2014-10-15 DIAGNOSIS — Z79899 Other long term (current) drug therapy: Secondary | ICD-10-CM | POA: Insufficient documentation

## 2014-10-15 DIAGNOSIS — R202 Paresthesia of skin: Secondary | ICD-10-CM | POA: Diagnosis not present

## 2014-10-15 DIAGNOSIS — R52 Pain, unspecified: Secondary | ICD-10-CM | POA: Diagnosis not present

## 2014-10-15 DIAGNOSIS — Z923 Personal history of irradiation: Secondary | ICD-10-CM | POA: Insufficient documentation

## 2014-10-15 DIAGNOSIS — R531 Weakness: Secondary | ICD-10-CM | POA: Diagnosis not present

## 2014-10-15 DIAGNOSIS — R5381 Other malaise: Secondary | ICD-10-CM | POA: Diagnosis not present

## 2014-10-15 DIAGNOSIS — M79606 Pain in leg, unspecified: Secondary | ICD-10-CM | POA: Diagnosis not present

## 2014-10-15 DIAGNOSIS — C021 Malignant neoplasm of border of tongue: Secondary | ICD-10-CM

## 2014-10-15 DIAGNOSIS — C029 Malignant neoplasm of tongue, unspecified: Secondary | ICD-10-CM | POA: Insufficient documentation

## 2014-10-15 MED ORDER — KETOROLAC TROMETHAMINE 30 MG/ML IJ SOLN
30.0000 mg | Freq: Once | INTRAMUSCULAR | Status: AC
  Administered 2014-10-15: 30 mg via INTRAMUSCULAR
  Filled 2014-10-15: qty 1

## 2014-10-15 MED ORDER — LEVOFLOXACIN 500 MG PO TABS: 500.0000 mg | ORAL_TABLET | Freq: Every day | ORAL | Status: DC

## 2014-10-15 MED ORDER — METHYLPREDNISOLONE SODIUM SUCC 125 MG IJ SOLR
60.0000 mg | Freq: Once | INTRAMUSCULAR | Status: AC
  Administered 2014-10-15: 60 mg via INTRAMUSCULAR

## 2014-10-15 NOTE — Telephone Encounter (Signed)
1000 appt agreed upon

## 2014-10-15 NOTE — Progress Notes (Signed)
[place]  [phone]  Fax:([phone]) [place]: [birth year]  MR#: [id number]  BPZ#:[id number]  Patient Care Team: Crecencio Mc, MD as PCP - General (Internal Medicine)  CHIEF COMPLAINT:  Chief Complaint  Patient presents with  . Acute Visit    states is having moderate pain in left shoulder that radiates to left side of neck, face, and down left arm. swelling is noticed in left chest wall, neck, face. has more pain with movement. pain and swelling was worse yesterday and is slightly better today.    INTERVAL HISTORY:  Patient is here for further evaluation and treatment consideration regarding worsening of swelling and pain in his left neck and shoulder, face, and down the left arm. He reports having had increasing pain on Tuesday of this week but on Wednesday it was much better and he may have "over did it". On Thursday the pain and swelling was much worse but has improved slightly today.   REVIEW OF SYSTEMS:   Review of Systems  Constitutional: Positive for malaise/fatigue. Negative for fever and chills.  Respiratory: Negative for cough, shortness of breath and wheezing.   Gastrointestinal: Negative for nausea, vomiting, diarrhea and constipation.  Musculoskeletal: Positive for neck pain. Negative for falls.       With Swelling  Skin:       Increasing redness around sores of neck.  Neurological: Positive for weakness.    As per HPI. Otherwise, a complete review of systems is negatve.  ONCOLOGY HISTORY: Oncology History   51. 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 Margins were clear but close at 5 mm. Depth of invasion was 0.9 cm. There was no lymph vascular spread there was positive perineural spread. 22 nodes were at excised all negative for metastatic disease). 2.  Mediastinal/hilar Adenopathy on PET scan - EBUS procedure and biopsy at [facility] on 09/02/2011 negative for malignancy.  Also RLL lung biopsy at [facility] on 08/30/2011 negative for malignancy. 3. Patient had lower left cervical lymph node removed.  (Surgery was done at [facility]) followed by introperative radiation therapy.(May of 2014) 4  Ebus in May of 2014 insufficient tissue 5. 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 6. Suspected Recurrent disease in the neck patient was started on cis-platinum, cetuximab and radiation therapy July17, 2014 7. Deep Vein  thromboses in the left lower extremity on Xaralto 8. Finished radiation and chemotherapy December 13, 2012 9. Abnormal CT scan and a PET scan.  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 Patient started on 5-FU , Cis-platinum, docetaxel from February 11, 2013 has finished total  5  cycles of chemotherapy. 10. Progressing disease.  Started on palliative radiation treatment with cetuximab.  (February, 201 5) 11. Finished radiation then cetuximab in April of 2015. 12. On carboplatinum and Taxol  and cetuximab 13 Started on maintenance  cetuimab from January 21, 2014 14.progressive disease so started on  keytruda from March 15, 2014 15.ecent episode of thrombosis of left subclavian, left axillary veinpatient underwent thrombolytic therapy.  Removal of port.  (Because of infection)and started on Lovenox. 16.progressive disease on KEYTRUDA.  Palliative radiation therapy 17.palliative radiation therapy,march 201 18.  On IV methotrexate starting from April of 2016     Primary cancer  of tongue   08/26/2012 Initial Diagnosis Primary cancer of tongue    PAST MEDICAL HISTORY: Past Medical History  Diagnosis Date  . Allergy   . Cancer     tongue and neck    PAST SURGICAL HISTORY: Past Surgical History  Procedure Laterality Date  . Excision of tongue lesion with  laser  08/13/11  . Neck dissection Bilateral 08/13/11  . Thoracotomy      FAMILY HISTORY Family History  Problem Relation Age of Onset  . Cancer Father 21    prostate     GYNECOLOGIC HISTORY:  No LMP for male patient.     ADVANCED DIRECTIVES:    HEALTH MAINTENANCE: History  Substance Use Topics  . Smoking status: Never Smoker   . Smokeless tobacco: Not on file  . Alcohol Use: No     Comment: very limited     Colonoscopy:  PAP:  Bone density:  Lipid panel:  Allergies  Allergen Reactions  . Azithromycin Other (See Comments)    Pt states he gets real flushed red and sweaty.  . Penicillins Hives    Childhood reaction reported to patient by mother.  . Sulfa Antibiotics     Childhood reactions  . Erythromycin Base Swelling    Current Outpatient Prescriptions  Medication Sig Dispense Refill  . azelastine (ASTELIN) 0.1 % nasal spray Place 1 spray into the nose 2 (two) times daily as needed. For allergies    . calcium-vitamin D (OSCAL WITH D) 500-200 MG-UNIT per tablet Take 1 tablet by mouth 2 (two) times daily. 60 tablet 3  . chlorhexidine (PERIDEX) 0.12 % solution Use as directed 15 mLs in the mouth or throat 2 (two) times daily.    . citalopram (CELEXA) 20 MG tablet Take 1 tablet (20 mg total) by mouth at bedtime. 30 tablet 2  . dexamethasone (DECADRON) 4 MG tablet Take 2 tablets (8 mg total) by mouth daily. 30 tablet 0  . docusate sodium (COLACE) 100 MG capsule Take 100 mg by mouth daily as needed. for constipation.    Marland Kitchen doxycycline (VIBRA-TABS) 100 MG tablet Take 100 mg by mouth 2 (two) times daily.    Marland Kitchen enoxaparin (LOVENOX) 80 MG/0.8ML injection Inject 80 mg into the skin 2 (two) times daily.    . fentaNYL (DURAGESIC - DOSED MCG/HR) 100 MCG/HR Place 1 patch (100 mcg total) onto the skin every other day. 15 patch 0  . fentaNYL (DURAGESIC - DOSED MCG/HR) 75 MCG/HR Place 1 patch (75 mcg total) onto the skin every other day. 15 patch 0  . fluticasone (FLONASE) 50  MCG/ACT nasal spray Place 2 sprays into the nose daily as needed. for allergies    . omeprazole (PRILOSEC OTC) 20 MG tablet Take 20 mg by mouth every morning.    Marland Kitchen oxyCODONE (ROXICODONE) 15 MG immediate release tablet Take 1-2 tablets (15-30 mg total) by mouth every 6 (six) hours as needed for pain. 120 tablet 0  . pregabalin (LYRICA) 75 MG capsule Take 1 capsule (75 mg total) by mouth 2 (two) times daily. 60 capsule 3   No current facility-administered medications for this visit.    OBJECTIVE: BP 104/73 mmHg  Pulse 94  Temp(Src) 96 F (35.6 C) (Tympanic)  Resp 18  Wt 174 lb 13.2 oz (79.3 kg)   Body mass index is 23.07 kg/(m^2).    ECOG FS:1 - Symptomatic but completely ambulatory  General: Well-developed, well-nourished, no acute distress. HEENT: Normocephalic, moist mucous membranes, clear oropharnyx. Lungs: Clear to auscultation  bilaterally. Heart: Regular rate and rhythm. No rubs, murmurs, or gallops. Musculoskeletal: Swelling and pain in left shoulder and arm. Neuro: Alert, answering all questions appropriately. Cranial nerves grossly intact. Skin: Left neck covered with bandages but with increasing redness per patient.  Psych: Normal affect.   LAB RESULTS:     Component Value Date/Time   NA 132* 10/12/2014 1424   NA 131* 09/06/2014 1409   K 4.0 10/12/2014 1424   K 3.7 09/06/2014 1409   CL 94* 10/12/2014 1424   CL 96* 09/06/2014 1409   CO2 31 10/12/2014 1424   CO2 28 09/06/2014 1409   GLUCOSE 153* 10/12/2014 1424   GLUCOSE 186* 09/06/2014 1409   BUN 10 10/12/2014 1424   BUN 12 09/06/2014 1409   CREATININE 0.75 10/12/2014 1424   CREATININE 0.83 09/06/2014 1409   CALCIUM 8.7* 10/12/2014 1424   CALCIUM 8.8* 09/06/2014 1409   PROT 5.7* 10/12/2014 1424   PROT 6.0* 08/30/2014 1342   ALBUMIN 2.7* 10/12/2014 1424   ALBUMIN 2.7* 08/30/2014 1342   AST 17 10/12/2014 1424   AST 22 08/30/2014 1342   ALT 82* 10/12/2014 1424   ALT 33 08/30/2014 1342   ALKPHOS 136*  10/12/2014 1424   ALKPHOS 145* 08/30/2014 1342   BILITOT 0.4 10/12/2014 1424   GFRNONAA >60 10/12/2014 1424   GFRNONAA >60 09/06/2014 1409   GFRAA >60 10/12/2014 1424   GFRAA >60 09/06/2014 1409    No results found for: SPEP, UPEP  Lab Results  Component Value Date   WBC 7.4 10/12/2014   NEUTROABS 6.7* 10/12/2014   HGB 12.0* 10/12/2014   HCT 37.4* 10/12/2014   MCV 93.8 10/12/2014   PLT 234 10/12/2014      Chemistry      Component Value Date/Time   NA 132* 10/12/2014 1424   NA 131* 09/06/2014 1409   K 4.0 10/12/2014 1424   K 3.7 09/06/2014 1409   CL 94* 10/12/2014 1424   CL 96* 09/06/2014 1409   CO2 31 10/12/2014 1424   CO2 28 09/06/2014 1409   BUN 10 10/12/2014 1424   BUN 12 09/06/2014 1409   CREATININE 0.75 10/12/2014 1424   CREATININE 0.83 09/06/2014 1409      Component Value Date/Time   CALCIUM 8.7* 10/12/2014 1424   CALCIUM 8.8* 09/06/2014 1409   ALKPHOS 136* 10/12/2014 1424   ALKPHOS 145* 08/30/2014 1342   AST 17 10/12/2014 1424   AST 22 08/30/2014 1342   ALT 82* 10/12/2014 1424   ALT 33 08/30/2014 1342   BILITOT 0.4 10/12/2014 1424       No results found for: LABCA2  No components found for: LABCA125  No results for input(s): INR in the last 168 hours.     Component Value Date/Time   COLORURINE YELLOW* 09/25/2014 1932   APPEARANCEUR HAZY* 09/25/2014 1932   LABSPEC 1.006 09/25/2014 1932   PHURINE 8.0 09/25/2014 1932   GLUCOSEU NEGATIVE 09/25/2014 1932   HGBUR 2+* 09/25/2014 1932   BILIRUBINUR NEGATIVE 09/25/2014 1932   KETONESUR NEGATIVE 09/25/2014 1932   PROTEINUR 30* 09/25/2014 1932   NITRITE NEGATIVE 09/25/2014 1932   LEUKOCYTESUR NEGATIVE 09/25/2014 1932    STUDIES: Dg Chest 2 View  09/25/2014   CLINICAL DATA:  Productive cough for several days, worsening. Increased weakness. History of head and neck cancer. Nonhealing ulcers in the left neck and sternum.  EXAM: CHEST  2 VIEW  COMPARISON:  CT chest 07/27/2014.  Chest 10/10/2013.   FINDINGS: Shallow inspiration. Linear atelectasis in the  left lung base. Heart size and pulmonary vascularity are normal for technique. No focal airspace disease or consolidation in the lungs. Somewhat a amorphous shadows are projected over the left upper chest, likely corresponding to the chest wall mass seen on previous CT. Surgical clips in the left side of the neck.  IMPRESSION: No active cardiopulmonary disease.   Electronically Signed   By: Lucienne Capers M.D.   On: 09/25/2014 23:09   Nm Bone Scan Whole Body  09/29/2014   CLINICAL DATA:  Tongue cancer. Back pain. Left arm and shoulder pain.  EXAM: NUCLEAR MEDICINE WHOLE BODY BONE SCAN  TECHNIQUE: Whole body anterior and posterior images were obtained approximately 3 hours after intravenous injection of radiopharmaceutical.  RADIOPHARMACEUTICALS:  24 mCi Technetium-11m MDP IV  COMPARISON:  PET-CT 05/31/2014 .  FINDINGS: Increased activity noted in the medial left clavicle, manubrium, and sternum. Similar findings noted on recent PET-CT. These findings suggest metastatic disease. No other focal abnormalities noted to suggest metastatic disease.  IMPRESSION: Intense increased activity noted in the medial left clavicle, manubrium, and sternum consistent with the possibility of metastatic disease. Similar findings noted on recent PET-CT. No other focal abnormalities identified.   Electronically Signed   By: Marcello Moores  Register   On: 09/29/2014 13:01    ASSESSMENT:  1. Base of tongue cancer.  2. Pain.  PLAN:   1. Patient is currently under treatment with MTX. He has had increasing pain over the last couple of days due to excessive use on a day when he was feeling much better. Will administer toradol and solumedrol today for pain and inflammation. He is currently taking BID Doxycycline for infection prevention but will add Levaquin for 7 days as his neck wounds are increasingly red per patient and his wife. Patient has follow up scheduled with Dr. Oliva Bustard on  Monday.   Patient expressed understanding and was in agreement with this plan. He also understands that He can call clinic at any time with any questions, concerns, or complaints.    Primary cancer of tongue   Staging form: Lip and Oral Cavity, AJCC 7th Edition     Clinical: Stage IVC (T2, N2, M1) - Unsigned   Evlyn Kanner, NP   10/15/2014 11:19 AM

## 2014-10-18 ENCOUNTER — Inpatient Hospital Stay (HOSPITAL_BASED_OUTPATIENT_CLINIC_OR_DEPARTMENT_OTHER): Payer: BC Managed Care – PPO | Admitting: Oncology

## 2014-10-18 ENCOUNTER — Inpatient Hospital Stay: Payer: BC Managed Care – PPO

## 2014-10-18 VITALS — BP 106/74 | HR 97 | Temp 96.2°F | Wt 179.9 lb

## 2014-10-18 DIAGNOSIS — Z79899 Other long term (current) drug therapy: Secondary | ICD-10-CM

## 2014-10-18 DIAGNOSIS — R0789 Other chest pain: Secondary | ICD-10-CM

## 2014-10-18 DIAGNOSIS — R531 Weakness: Secondary | ICD-10-CM

## 2014-10-18 DIAGNOSIS — R202 Paresthesia of skin: Secondary | ICD-10-CM

## 2014-10-18 DIAGNOSIS — Z86718 Personal history of other venous thrombosis and embolism: Secondary | ICD-10-CM

## 2014-10-18 DIAGNOSIS — C760 Malignant neoplasm of head, face and neck: Secondary | ICD-10-CM

## 2014-10-18 DIAGNOSIS — R5383 Other fatigue: Secondary | ICD-10-CM

## 2014-10-18 DIAGNOSIS — Z7901 Long term (current) use of anticoagulants: Secondary | ICD-10-CM

## 2014-10-18 DIAGNOSIS — C7951 Secondary malignant neoplasm of bone: Secondary | ICD-10-CM

## 2014-10-18 DIAGNOSIS — R5381 Other malaise: Secondary | ICD-10-CM

## 2014-10-18 DIAGNOSIS — R197 Diarrhea, unspecified: Secondary | ICD-10-CM

## 2014-10-18 DIAGNOSIS — M79606 Pain in leg, unspecified: Secondary | ICD-10-CM | POA: Diagnosis not present

## 2014-10-18 DIAGNOSIS — C029 Malignant neoplasm of tongue, unspecified: Secondary | ICD-10-CM | POA: Diagnosis not present

## 2014-10-18 DIAGNOSIS — C021 Malignant neoplasm of border of tongue: Secondary | ICD-10-CM

## 2014-10-18 LAB — CBC WITH DIFFERENTIAL/PLATELET
BASOS PCT: 0 %
Basophils Absolute: 0 10*3/uL (ref 0–0.1)
EOS ABS: 0 10*3/uL (ref 0–0.7)
EOS PCT: 1 %
HCT: 35.9 % — ABNORMAL LOW (ref 40.0–52.0)
HEMOGLOBIN: 11.5 g/dL — AB (ref 13.0–18.0)
Lymphocytes Relative: 3 %
Lymphs Abs: 0.3 10*3/uL — ABNORMAL LOW (ref 1.0–3.6)
MCH: 30.2 pg (ref 26.0–34.0)
MCHC: 32 g/dL (ref 32.0–36.0)
MCV: 94.4 fL (ref 80.0–100.0)
Monocytes Absolute: 0.4 10*3/uL (ref 0.2–1.0)
Monocytes Relative: 5 %
NEUTROS PCT: 91 %
Neutro Abs: 7.1 10*3/uL — ABNORMAL HIGH (ref 1.4–6.5)
Platelets: 319 10*3/uL (ref 150–440)
RBC: 3.8 MIL/uL — ABNORMAL LOW (ref 4.40–5.90)
RDW: 24.7 % — AB (ref 11.5–14.5)
WBC: 7.8 10*3/uL (ref 3.8–10.6)

## 2014-10-18 LAB — COMPREHENSIVE METABOLIC PANEL
ALBUMIN: 2.7 g/dL — AB (ref 3.5–5.0)
ALK PHOS: 133 U/L — AB (ref 38–126)
ALT: 56 U/L (ref 17–63)
AST: 18 U/L (ref 15–41)
Anion gap: 7 (ref 5–15)
BUN: 12 mg/dL (ref 6–20)
CO2: 25 mmol/L (ref 22–32)
Calcium: 8.6 mg/dL — ABNORMAL LOW (ref 8.9–10.3)
Chloride: 100 mmol/L — ABNORMAL LOW (ref 101–111)
Creatinine, Ser: 0.87 mg/dL (ref 0.61–1.24)
GFR calc non Af Amer: >60 mL/min (ref >60)
Glucose, Bld: 137 mg/dL — ABNORMAL HIGH (ref 65–99)
Potassium: 3.4 mmol/L — ABNORMAL LOW (ref 3.5–5.1)
Sodium: 132 mmol/L — ABNORMAL LOW (ref 135–145)
TOTAL PROTEIN: 5.7 g/dL — AB (ref 6.5–8.1)
Total Bilirubin: 0.4 mg/dL (ref 0.3–1.2)

## 2014-10-18 MED ORDER — OXYCODONE HCL 15 MG PO TABS: 15.0000 mg | ORAL_TABLET | Freq: Four times a day (QID) | ORAL | Status: DC | PRN

## 2014-10-18 NOTE — Progress Notes (Signed)
Pt never smoked.  Does not have living will.

## 2014-10-20 ENCOUNTER — Encounter: Payer: Self-pay | Admitting: Oncology

## 2014-10-20 NOTE — Progress Notes (Signed)
[place] @ [facility] [phone]  Fax:([phone]) [place] OB: 03-07-1966  MR#: [id number]  SPQ#:[id number]  Patient Care Team: Crecencio Mc, MD as PCP - General (Internal Medicine) Algernon Huxley, MD as Consulting Physician (Vascular Surgery)  CHIEF COMPLAINT:  Chief Complaint  Patient presents with  . Follow-up    Oncology History   97. 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 Margins were clear but close at 5 mm. Depth of invasion was 0.9 cm. There was no lymph vascular spread there was positive perineural spread. 22 nodes were at excised all negative for metastatic disease). 2. Mediastinal/hilar Adenopathy on PET scan - EBUS procedure and biopsy at [facility] on 09/02/2011 negative for malignancy.  Also RLL lung biopsy at [facility] on 08/30/2011 negative for malignancy. 3. Patient had lower left cervical lymph node removed.  (Surgery was done at [facility]) followed by introperative radiation therapy.(May of 2014) 4  Ebus in May of 2014 insufficient tissue 5. 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 6. Suspected Recurrent disease in the neck patient was started on cis-platinum, cetuximab and radiation therapy July17, 2014 7. Deep Vein  thromboses in the left lower extremity on Xaralto 8. Finished radiation and chemotherapy December 13, 2012 9. Abnormal CT scan and a PET scan.  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 Patient started on 5-FU , Cis-platinum, docetaxel from February 11, 2013 has finished total  5  cycles of chemotherapy. 10. Progressing disease.  Started on palliative radiation treatment with cetuximab.  (February, 201 5) 11. Finished radiation then cetuximab in April of 2015. 12. On carboplatinum and Taxol   and cetuximab 13 Started on maintenance  cetuimab from January 21, 2014 14.progressive disease so started on  keytruda from March 15, 2014 15.ecent episode of thrombosis of left subclavian, left axillary veinpatient underwent thrombolytic therapy.  Removal of port.  (Because of infection)and started on Lovenox. 16.progressive disease on KEYTRUDA.  Palliative radiation therapy 17.palliative radiation therapy,march 201 18.  On IV methotrexate starting from April of 2016     Primary cancer of tongue   08/26/2012 Initial Diagnosis Primary cancer of tongue    Oncology Flowsheet 09/14/2014 09/26/2014 09/26/2014 09/27/2014 10/04/2014 10/12/2014 10/15/2014  Day, Cycle Day 1, Cycle 1 - - Day 1, Cycle 3 Day 1, Cycle 1 Day 1, Cycle 2 -  dexamethasone (DECADRON) IV [ 10 mg ] - - [ 10 mg ] [ 10 mg ] [ 10 mg ] -  dexamethasone (DECADRON) PO - 8 mg   - - - -  enoxaparin (LOVENOX) [place] - 80 mg 80 mg - - - -  methotrexate (PF) IV 30 mg/m2 - - 30 mg/m2 65 mg 65 mg -  methylPREDNISolone sodium succinate 125 mg/2 mL (SOLU-MEDROL) IM - - - - - - 60 mg  ondansetron (ZOFRAN) IV [ 8 mg ] - - [ 8 mg ] [ 8 mg ] [ 8 mg ] -    INTERVAL HISTORY: 49 year old gentleman with carcinoma of head and neck stage IV disease.  Does not have any pain in lower extremity.  Had a bone scan done which has been reviewed independently.  Shows increase uptake in unknown metastatic  site in clavicle.  And sternum.  But no evidence  of metastases to the spine. Pain in the left upper chest for diarrhea continues.  Here for further follow-up and treatment consideration with methotrexate. 49 year old gentleman with recurrent head and neck cancer.  He has increasing pain in the left upper chest wall area started today.  With more drainage.  Patient is on methotrexate no soreness in the mouth October 18, 2014 Patient is here for ongoing evaluation.  Has noticed some more swelling in the chest wall area.  Patient is getting evaluated by vascular surgeon.   Patient is off methotrexate during this week. Received  intravenous Toradol injection REVIEW OF SYSTEMS:   Gen. status: Patient is weak tired.  Declining performance status.  HEENT: Continuing drainage from the left side of the neck and chest.  Lungs: Increasing shortness of breath.  GI: No nausea.  No vomiting.  No diarrhea.  Lower extremity: Pain below both knees crams tenderness tingling numbness.  Lower extremity no swelling.  Neurological system: No headache no dizziness.  No weakness in lower extremity.  Cardiac: No chest pain.  No palpitations.  Musculoskeletal system no bony pains. All other systems have been reviewed Lower extremity bony pains have improved  As per HPI. Otherwise, a complete review of systems is negatve.  PAST MEDICAL HISTORY: Past Medical History  Diagnosis Date  . Allergy   . Cancer     tongue and neck    PAST SURGICAL HISTORY: Past Surgical History  Procedure Laterality Date  . Excision of tongue lesion with laser  08/13/11  . Neck dissection Bilateral 08/13/11  . Thoracotomy      FAMILY HISTORY Family History  Problem Relation Age of Onset  . Cancer Father 78    prostate          ADVANCED DIRECTIVES: Advance care directive: Patient does have advanced healthcare directive and has been reviewed.  Patient does not want to change it at present time    HEALTH MAINTENANCE: History  Substance Use Topics  . Smoking status: Never Smoker   . Smokeless tobacco: Not on file  . Alcohol Use: No     Comment: very limited     Colonoscopy:  PAP:  Bone density:  Lipid panel:  Allergies  Allergen Reactions  . Azithromycin Other (See Comments)    Pt states he gets real flushed red and sweaty.  . Penicillins Hives    Childhood reaction reported to patient by mother.  . Sulfa Antibiotics     Childhood reactions  . Erythromycin Base Swelling    Current Outpatient Prescriptions  Medication Sig Dispense Refill  . azelastine (ASTELIN) 0.1 % nasal  spray Place 1 spray into the nose 2 (two) times daily as needed. For allergies    . calcium-vitamin D (OSCAL WITH D) 500-200 MG-UNIT per tablet Take 1 tablet by mouth 2 (two) times daily. 60 tablet 3  . chlorhexidine (PERIDEX) 0.12 % solution Use as directed 15 mLs in the mouth or throat 2 (two) times daily.    . citalopram (CELEXA) 20 MG tablet Take 1 tablet (20 mg total) by mouth at bedtime. 30 tablet 2  . dexamethasone (DECADRON) 4 MG tablet Take 2 tablets (8 mg total) by mouth daily. 30 tablet 0  . docusate sodium (COLACE) 100 MG capsule Take 100 mg by mouth daily as needed. for constipation.    Marland Kitchen doxycycline (VIBRA-TABS) 100 MG tablet Take 100 mg by mouth 2 (two) times daily.    Marland Kitchen enoxaparin (LOVENOX) 80 MG/0.8ML injection Inject 80 mg into  the skin 2 (two) times daily.    . fentaNYL (DURAGESIC - DOSED MCG/HR) 100 MCG/HR Place 1 patch (100 mcg total) onto the skin every other day. 15 patch 0  . fentaNYL (DURAGESIC - DOSED MCG/HR) 75 MCG/HR Place 1 patch (75 mcg total) onto the skin every other day. 15 patch 0  . fluticasone (FLONASE) 50 MCG/ACT nasal spray Place 2 sprays into the nose daily as needed. for allergies    . levofloxacin (LEVAQUIN) 500 MG tablet Take 1 tablet (500 mg total) by mouth daily. 10 tablet 0  . omeprazole (PRILOSEC OTC) 20 MG tablet Take 20 mg by mouth every morning.    Marland Kitchen oxyCODONE (ROXICODONE) 15 MG immediate release tablet Take 1-2 tablets (15-30 mg total) by mouth every 6 (six) hours as needed for pain. 120 tablet 0  . pregabalin (LYRICA) 75 MG capsule Take 1 capsule (75 mg total) by mouth 2 (two) times daily. 60 capsule 3   No current facility-administered medications for this visit.    OBJECTIVE:  Filed Vitals:   10/18/14 1446  BP: 106/74  Pulse: 97  Temp: 96.2 F (35.7 C)     Body mass index is 23.74 kg/(m^2).    ECOG FS:2 - Symptomatic, <50% confined to bed  PHYSICAL EXAM: Gen. status: Performance status is declining HEENT increasing pain and swelling  on the left side of the neck redness.  Lungs: Air entry on both sides no palpitation or rhonchi no rales.  Abdomen: Soft liver and spleen not enlarged.  Cardiac: Tachycardia skin: No rash.  Neurological system:higher functions within normal limit.  Motor and sensory system in both upper and lower extremities within normal limit.  Tenderness in both calf muscles.  Musculoskeletal system no joint swelling.  No joint tenderness. Psychiatric system: Patient is somewhat depressed The neck area continues to show progressive swelling   LAB RESULTS:  Appointment on 10/18/2014  Component Date Value Ref Range Status  . WBC 10/18/2014 7.8  3.8 - 10.6 K/uL Final  . RBC 10/18/2014 3.80* 4.40 - 5.90 MIL/uL Final  . Hemoglobin 10/18/2014 11.5* 13.0 - 18.0 g/dL Final  . HCT 10/18/2014 35.9* 40.0 - 52.0 % Final  . MCV 10/18/2014 94.4  80.0 - 100.0 fL Final  . MCH 10/18/2014 30.2  26.0 - 34.0 pg Final  . MCHC 10/18/2014 32.0  32.0 - 36.0 g/dL Final  . RDW 10/18/2014 24.7* 11.5 - 14.5 % Final  . Platelets 10/18/2014 319  150 - 440 K/uL Final  . Neutrophils Relative % 10/18/2014 91   Final  . Neutro Abs 10/18/2014 7.1* 1.4 - 6.5 K/uL Final  . Lymphocytes Relative 10/18/2014 3   Final  . Lymphs Abs 10/18/2014 0.3* 1.0 - 3.6 K/uL Final  . Monocytes Relative 10/18/2014 5   Final  . Monocytes Absolute 10/18/2014 0.4  0.2 - 1.0 K/uL Final  . Eosinophils Relative 10/18/2014 1   Final  . Eosinophils Absolute 10/18/2014 0.0  0 - 0.7 K/uL Final  . Basophils Relative 10/18/2014 0   Final  . Basophils Absolute 10/18/2014 0.0  0 - 0.1 K/uL Final  . Sodium 10/18/2014 132* 135 - 145 mmol/L Final  . Potassium 10/18/2014 3.4* 3.5 - 5.1 mmol/L Final  . Chloride 10/18/2014 100* 101 - 111 mmol/L Final  . CO2 10/18/2014 25  22 - 32 mmol/L Final  . Glucose, Bld 10/18/2014 137* 65 - 99 mg/dL Final  . BUN 10/18/2014 12  6 - 20 mg/dL Final  . Creatinine, Ser 10/18/2014 0.87  0.61 - 1.24 mg/dL Final  . Calcium 10/18/2014 8.6*  8.9 - 10.3 mg/dL Final  . Total Protein 10/18/2014 5.7* 6.5 - 8.1 g/dL Final  . Albumin 10/18/2014 2.7* 3.5 - 5.0 g/dL Final  . AST 10/18/2014 18  15 - 41 U/L Final  . ALT 10/18/2014 56  17 - 63 U/L Final  . Alkaline Phosphatase 10/18/2014 133* 38 - 126 U/L Final  . Total Bilirubin 10/18/2014 0.4  0.3 - 1.2 mg/dL Final  . GFR calc non Af Amer 10/18/2014 >60  >60 mL/min Final  . GFR calc Af Amer 10/18/2014 >60  >60 mL/min Final   Comment: (NOTE) The eGFR has been calculated using the CKD EPI equation. This calculation has not been validated in all clinical situations. eGFR's persistently <60 mL/min signify possible Chronic Kidney Disease.   . Anion gap 10/18/2014 7  5 - 15 Final     STUDIES: Dg Chest 2 View  09/25/2014   CLINICAL DATA:  Productive cough for several days, worsening. Increased weakness. History of head and neck cancer. Nonhealing ulcers in the left neck and sternum.  EXAM: CHEST  2 VIEW  COMPARISON:  CT chest 07/27/2014.  Chest 10/10/2013.  FINDINGS: Shallow inspiration. Linear atelectasis in the left lung base. Heart size and pulmonary vascularity are normal for technique. No focal airspace disease or consolidation in the lungs. Somewhat a amorphous shadows are projected over the left upper chest, likely corresponding to the chest wall mass seen on previous CT. Surgical clips in the left side of the neck.  IMPRESSION: No active cardiopulmonary disease.   Electronically Signed   By: Lucienne Capers M.D.   On: 09/25/2014 23:09   Nm Bone Scan Whole Body  09/29/2014   CLINICAL DATA:  Tongue cancer. Back pain. Left arm and shoulder pain.  EXAM: NUCLEAR MEDICINE WHOLE BODY BONE SCAN  TECHNIQUE: Whole body anterior and posterior images were obtained approximately 3 hours after intravenous injection of radiopharmaceutical.  RADIOPHARMACEUTICALS:  24 mCi Technetium-74mMDP IV  COMPARISON:  PET-CT 05/31/2014 .  FINDINGS: Increased activity noted in the medial left clavicle, manubrium,  and sternum. Similar findings noted on recent PET-CT. These findings suggest metastatic disease. No other focal abnormalities noted to suggest metastatic disease.  IMPRESSION: Intense increased activity noted in the medial left clavicle, manubrium, and sternum consistent with the possibility of metastatic disease. Similar findings noted on recent PET-CT. No other focal abnormalities identified.   Electronically Signed   By: TMarcello Moores Register   On: 09/29/2014 13:01    ASSESSMENT: 49year old gentleman with stage IV and recurrent and progressive carcinoma of tongue   on methotrexate. Lower extremity pain etiology not clear. Scan has been reviewed independently and is been negative for any metastases  Anemia. Continue Decadron 8 mg daily Will receive intravenous methotrexate next week We will need port placement because of vinorelbine  Possibility of progressive tumor cannot be ruled out.  Possibility of changing chemotherapy to navelbine gemcitabine.  MEDICAL DECISION MAKING:  All lab data has been reviewed.  Patient's last hospitalization records have been reviewed increase Decadron to 8 mg daily On fentanyl patch.  Lyrica .  To control pain   Patient expressed understanding and was in agreement with this plan. He also understands that He can call clinic at any time with any questions, concerns, or complaints.    Primary cancer of tongue   Staging form: Lip and Oral Cavity, AJCC 7th Edition     Clinical: Stage IVC (T2, N2, M1) -  Marni Griffon, MD   10/20/2014 9:32 AM

## 2014-10-22 ENCOUNTER — Telehealth: Payer: Self-pay | Admitting: *Deleted

## 2014-10-22 NOTE — Telephone Encounter (Signed)
Have not hear from wound nurse at this time. Pt informed that will go ahead and make referral to wound clinic for evaluation regarding management of neck wound. Pt verbalized understanding.

## 2014-10-22 NOTE — Telephone Encounter (Signed)
Pt called to see if Domenic Moras, wound RN, can evaluate pt's wound on Monday 6/13 when has appt for treatment.

## 2014-10-25 ENCOUNTER — Inpatient Hospital Stay: Payer: BC Managed Care – PPO

## 2014-10-25 ENCOUNTER — Ambulatory Visit: Payer: BC Managed Care – PPO

## 2014-10-25 VITALS — BP 96/61 | HR 98 | Resp 18

## 2014-10-25 DIAGNOSIS — C021 Malignant neoplasm of border of tongue: Secondary | ICD-10-CM

## 2014-10-25 DIAGNOSIS — C029 Malignant neoplasm of tongue, unspecified: Secondary | ICD-10-CM | POA: Diagnosis not present

## 2014-10-25 LAB — CBC WITH DIFFERENTIAL/PLATELET
BASOS PCT: 0 %
Basophils Absolute: 0 10*3/uL (ref 0–0.1)
EOS ABS: 0 10*3/uL (ref 0–0.7)
Eosinophils Relative: 0 %
HCT: 38.4 % — ABNORMAL LOW (ref 40.0–52.0)
Hemoglobin: 12.2 g/dL — ABNORMAL LOW (ref 13.0–18.0)
Lymphocytes Relative: 1 %
Lymphs Abs: 0.2 10*3/uL — ABNORMAL LOW (ref 1.0–3.6)
MCH: 30.5 pg (ref 26.0–34.0)
MCHC: 31.8 g/dL — AB (ref 32.0–36.0)
MCV: 96.1 fL (ref 80.0–100.0)
Monocytes Absolute: 0.7 10*3/uL (ref 0.2–1.0)
Monocytes Relative: 5 %
NEUTROS PCT: 94 %
Neutro Abs: 15.2 10*3/uL — ABNORMAL HIGH (ref 1.4–6.5)
Platelets: 304 10*3/uL (ref 150–440)
RBC: 4 MIL/uL — ABNORMAL LOW (ref 4.40–5.90)
RDW: 25.4 % — ABNORMAL HIGH (ref 11.5–14.5)
WBC: 16.2 10*3/uL — AB (ref 3.8–10.6)

## 2014-10-25 LAB — COMPREHENSIVE METABOLIC PANEL
ALBUMIN: 2.9 g/dL — AB (ref 3.5–5.0)
ALT: 31 U/L (ref 17–63)
ANION GAP: 6 (ref 5–15)
AST: 15 U/L (ref 15–41)
Alkaline Phosphatase: 115 U/L (ref 38–126)
BUN: 13 mg/dL (ref 6–20)
CALCIUM: 8.9 mg/dL (ref 8.9–10.3)
CHLORIDE: 99 mmol/L — AB (ref 101–111)
CO2: 28 mmol/L (ref 22–32)
CREATININE: 0.86 mg/dL (ref 0.61–1.24)
GFR calc Af Amer: >60 mL/min (ref >60)
GFR calc non Af Amer: >60 mL/min (ref >60)
Glucose, Bld: 93 mg/dL (ref 65–99)
Potassium: 3.5 mmol/L (ref 3.5–5.1)
SODIUM: 133 mmol/L — AB (ref 135–145)
Total Bilirubin: 0.5 mg/dL (ref 0.3–1.2)
Total Protein: 5.9 g/dL — ABNORMAL LOW (ref 6.5–8.1)

## 2014-10-25 MED ORDER — METHOTREXATE SODIUM (PF) CHEMO INJECTION 250 MG/10ML
65.0000 mg | Freq: Once | INTRAMUSCULAR | Status: AC
  Administered 2014-10-25: 65 mg via INTRAVENOUS
  Filled 2014-10-25: qty 2.6

## 2014-10-25 MED ORDER — SODIUM CHLORIDE 0.9 % IV SOLN
Freq: Once | INTRAVENOUS | Status: AC
  Administered 2014-10-25: 15:00:00 via INTRAVENOUS
  Filled 2014-10-25: qty 4

## 2014-10-27 ENCOUNTER — Other Ambulatory Visit: Payer: Self-pay | Admitting: Family Medicine

## 2014-10-27 ENCOUNTER — Telehealth: Payer: Self-pay | Admitting: *Deleted

## 2014-10-27 ENCOUNTER — Ambulatory Visit
Admission: RE | Admit: 2014-10-27 | Discharge: 2014-10-27 | Disposition: A | Discharge status: Home / Self Care | Payer: BC Managed Care – PPO | Source: Ambulatory Visit | Attending: Vascular Surgery | Admitting: Vascular Surgery

## 2014-10-27 ENCOUNTER — Encounter: Payer: Self-pay | Admitting: *Deleted

## 2014-10-27 ENCOUNTER — Encounter
Admission: RE | Disposition: A | Discharge status: Home / Self Care | Payer: Self-pay | Source: Ambulatory Visit | Attending: Vascular Surgery

## 2014-10-27 DIAGNOSIS — C760 Malignant neoplasm of head, face and neck: Secondary | ICD-10-CM | POA: Diagnosis present

## 2014-10-27 HISTORY — DX: Acute embolism and thrombosis of unspecified deep veins of unspecified lower extremity: I82.409

## 2014-10-27 HISTORY — PX: PERIPHERAL VASCULAR CATHETERIZATION: SHX172C

## 2014-10-27 SURGERY — PICC LINE INSERTION

## 2014-10-27 MED ORDER — HYDROMORPHONE HCL 1 MG/ML IJ SOLN
0.5000 mg | INTRAMUSCULAR | Status: DC | PRN
  Administered 2014-10-27: 0.5 mg via INTRAVENOUS

## 2014-10-27 MED ORDER — MIDAZOLAM HCL 2 MG/2ML IJ SOLN
INTRAMUSCULAR | Status: DC | PRN
  Administered 2014-10-27: 2 mg via INTRAVENOUS

## 2014-10-27 MED ORDER — ENOXAPARIN SODIUM 80 MG/0.8ML ~~LOC~~ SOLN
1.0000 mg/kg | Freq: Two times a day (BID) | SUBCUTANEOUS | Status: AC
  Administered 2014-10-27: 80 mg via SUBCUTANEOUS
  Filled 2014-10-27: qty 0.8

## 2014-10-27 MED ORDER — HYDROMORPHONE HCL 1 MG/ML IJ SOLN
0.5000 mg | INTRAMUSCULAR | Status: DC | PRN
Start: 2014-10-27 — End: 2014-10-29
  Administered 2014-10-27: 0.5 mg via INTRAVENOUS

## 2014-10-27 MED ORDER — LIDOCAINE-EPINEPHRINE (PF) 1 %-1:200000 IJ SOLN
INTRAMUSCULAR | Status: AC
  Filled 2014-10-27: qty 30

## 2014-10-27 MED ORDER — SODIUM CHLORIDE 0.9 % IV SOLN
INTRAVENOUS | Status: DC
  Administered 2014-10-27: 15:00:00 via INTRAVENOUS

## 2014-10-27 MED ORDER — FENTANYL CITRATE (PF) 100 MCG/2ML IJ SOLN
INTRAMUSCULAR | Status: AC
Start: 2014-10-27 — End: 2014-10-27
  Filled 2014-10-27: qty 2

## 2014-10-27 MED ORDER — HEPARIN (PORCINE) IN NACL 2-0.9 UNIT/ML-% IJ SOLN
INTRAMUSCULAR | Status: AC
  Filled 2014-10-27: qty 500

## 2014-10-27 MED ORDER — MIDAZOLAM HCL 5 MG/5ML IJ SOLN
INTRAMUSCULAR | Status: AC
  Filled 2014-10-27: qty 5

## 2014-10-27 MED ORDER — CLINDAMYCIN PHOSPHATE 300 MG/50ML IV SOLN
300.0000 mg | Freq: Once | INTRAVENOUS | Status: AC
  Administered 2014-10-27: 300 mg via INTRAVENOUS

## 2014-10-27 MED ORDER — CLINDAMYCIN PHOSPHATE 300 MG/50ML IV SOLN
INTRAVENOUS | Status: AC
  Administered 2014-10-27: 300 mg via INTRAVENOUS
  Filled 2014-10-27: qty 50

## 2014-10-27 MED ORDER — FENTANYL CITRATE (PF) 100 MCG/2ML IJ SOLN
INTRAMUSCULAR | Status: DC | PRN
  Administered 2014-10-27: 100 ug via INTRAVENOUS

## 2014-10-27 MED ORDER — HYDROMORPHONE HCL 1 MG/ML IJ SOLN
INTRAMUSCULAR | Status: AC
  Administered 2014-10-27: 0.5 mg via INTRAVENOUS
  Filled 2014-10-27: qty 1

## 2014-10-27 SURGICAL SUPPLY — 3 items
CATH PICC 5FR POWER SINGLE (IV SETS) ×4 IMPLANT
PACK ANGIOGRAPHY (CUSTOM PROCEDURE TRAY) ×4 IMPLANT
TOWEL OR 17X26 4PK STRL BLUE (TOWEL DISPOSABLE) ×4 IMPLANT

## 2014-10-27 NOTE — Progress Notes (Signed)
Pt resting post prn dilaudid, vss, discharge instructions given with questions answered, spoke with cancer center today who will arrange for home health tomorrow for pt. Taking po;'s without difficulty, ready for discharge, picc line with dressing intact,

## 2014-10-27 NOTE — Progress Notes (Signed)
Pt in procedure room for procedure, complaints of sev. Neck and shoulder pain, had been given fentanyl /versed for pain, Dr Delana Meyer present, to see pt. Who has foul odorous drg from left neck and shoulder area with dressing intact. Unable to lay head flat, Dr Delana Meyer paging Dr Oliva Bustard

## 2014-10-27 NOTE — Progress Notes (Signed)
Dr Delana Meyer speaking with pt concerning the concern of pt not tolerating procedure at this time, thus discussed placement of picc line, giving dilaudid 0.5mg  iv for severe pain at this time, and monitoring,

## 2014-10-27 NOTE — Progress Notes (Addendum)
Draining wounds left upper chest dressing intact, redness noted surrounding dressing, 158mcg fentanyl patch and 75mcg fentanyl patch noted left upper arm, swelling noted left upper arm d/t dvt according to pt

## 2014-10-28 ENCOUNTER — Telehealth: Payer: Self-pay | Admitting: *Deleted

## 2014-10-28 ENCOUNTER — Other Ambulatory Visit: Payer: Self-pay | Admitting: *Deleted

## 2014-10-28 ENCOUNTER — Encounter: Payer: Self-pay | Admitting: Vascular Surgery

## 2014-10-28 NOTE — Telephone Encounter (Signed)
Referral sent to [facility] for PICC line management.

## 2014-10-28 NOTE — Telephone Encounter (Signed)
Referral for [facility] cancelled. Referral for PICC line maintenance faxed to [facility].

## 2014-10-29 ENCOUNTER — Encounter: Payer: BC Managed Care – PPO | Attending: Surgery | Admitting: Surgery

## 2014-10-29 ENCOUNTER — Telehealth: Payer: Self-pay | Admitting: *Deleted

## 2014-10-29 DIAGNOSIS — Z79899 Other long term (current) drug therapy: Secondary | ICD-10-CM | POA: Insufficient documentation

## 2014-10-29 DIAGNOSIS — Z7901 Long term (current) use of anticoagulants: Secondary | ICD-10-CM | POA: Diagnosis not present

## 2014-10-29 DIAGNOSIS — C029 Malignant neoplasm of tongue, unspecified: Secondary | ICD-10-CM | POA: Insufficient documentation

## 2014-10-29 DIAGNOSIS — M79606 Pain in leg, unspecified: Secondary | ICD-10-CM | POA: Insufficient documentation

## 2014-10-29 DIAGNOSIS — R0789 Other chest pain: Secondary | ICD-10-CM | POA: Diagnosis not present

## 2014-10-29 DIAGNOSIS — Z86718 Personal history of other venous thrombosis and embolism: Secondary | ICD-10-CM | POA: Diagnosis not present

## 2014-10-29 DIAGNOSIS — Z9221 Personal history of antineoplastic chemotherapy: Secondary | ICD-10-CM | POA: Diagnosis not present

## 2014-10-29 DIAGNOSIS — C792 Secondary malignant neoplasm of skin: Secondary | ICD-10-CM | POA: Diagnosis not present

## 2014-10-29 DIAGNOSIS — R221 Localized swelling, mass and lump, neck: Secondary | ICD-10-CM | POA: Insufficient documentation

## 2014-10-29 DIAGNOSIS — C7951 Secondary malignant neoplasm of bone: Secondary | ICD-10-CM | POA: Diagnosis not present

## 2014-10-29 DIAGNOSIS — C770 Secondary and unspecified malignant neoplasm of lymph nodes of head, face and neck: Secondary | ICD-10-CM | POA: Diagnosis not present

## 2014-10-29 DIAGNOSIS — Z923 Personal history of irradiation: Secondary | ICD-10-CM | POA: Insufficient documentation

## 2014-10-29 MED ORDER — DEXAMETHASONE 4 MG PO TABS: 8.0000 mg | ORAL_TABLET | Freq: Every day | ORAL | Status: DC

## 2014-10-29 NOTE — Telephone Encounter (Signed)
Rx e scribed per PO L Herring, AGNP-C

## 2014-11-01 ENCOUNTER — Inpatient Hospital Stay: Payer: BC Managed Care – PPO

## 2014-11-01 ENCOUNTER — Inpatient Hospital Stay (HOSPITAL_BASED_OUTPATIENT_CLINIC_OR_DEPARTMENT_OTHER): Payer: BC Managed Care – PPO | Admitting: Oncology

## 2014-11-01 ENCOUNTER — Encounter: Payer: Self-pay | Admitting: Oncology

## 2014-11-01 VITALS — BP 138/83 | HR 89 | Temp 97.0°F | Wt 179.0 lb

## 2014-11-01 DIAGNOSIS — C792 Secondary malignant neoplasm of skin: Secondary | ICD-10-CM | POA: Diagnosis not present

## 2014-11-01 DIAGNOSIS — R0789 Other chest pain: Secondary | ICD-10-CM

## 2014-11-01 DIAGNOSIS — C770 Secondary and unspecified malignant neoplasm of lymph nodes of head, face and neck: Secondary | ICD-10-CM

## 2014-11-01 DIAGNOSIS — Z7901 Long term (current) use of anticoagulants: Secondary | ICD-10-CM

## 2014-11-01 DIAGNOSIS — Z9221 Personal history of antineoplastic chemotherapy: Secondary | ICD-10-CM

## 2014-11-01 DIAGNOSIS — R221 Localized swelling, mass and lump, neck: Secondary | ICD-10-CM

## 2014-11-01 DIAGNOSIS — Z86718 Personal history of other venous thrombosis and embolism: Secondary | ICD-10-CM

## 2014-11-01 DIAGNOSIS — Z79899 Other long term (current) drug therapy: Secondary | ICD-10-CM

## 2014-11-01 DIAGNOSIS — C029 Malignant neoplasm of tongue, unspecified: Secondary | ICD-10-CM

## 2014-11-01 DIAGNOSIS — Z923 Personal history of irradiation: Secondary | ICD-10-CM

## 2014-11-01 DIAGNOSIS — C021 Malignant neoplasm of border of tongue: Secondary | ICD-10-CM

## 2014-11-01 DIAGNOSIS — C7951 Secondary malignant neoplasm of bone: Secondary | ICD-10-CM

## 2014-11-01 DIAGNOSIS — M79606 Pain in leg, unspecified: Secondary | ICD-10-CM

## 2014-11-01 LAB — COMPREHENSIVE METABOLIC PANEL
ALBUMIN: 2.8 g/dL — AB (ref 3.5–5.0)
ALK PHOS: 116 U/L (ref 38–126)
ALT: 38 U/L (ref 17–63)
ANION GAP: 6 (ref 5–15)
AST: 18 U/L (ref 15–41)
BILIRUBIN TOTAL: 0.5 mg/dL (ref 0.3–1.2)
BUN: 14 mg/dL (ref 6–20)
CO2: 29 mmol/L (ref 22–32)
Calcium: 8.8 mg/dL — ABNORMAL LOW (ref 8.9–10.3)
Chloride: 99 mmol/L — ABNORMAL LOW (ref 101–111)
Creatinine, Ser: 0.9 mg/dL (ref 0.61–1.24)
GFR calc Af Amer: >60 mL/min (ref >60)
GLUCOSE: 149 mg/dL — AB (ref 65–99)
POTASSIUM: 3.6 mmol/L (ref 3.5–5.1)
SODIUM: 134 mmol/L — AB (ref 135–145)
Total Protein: 5.9 g/dL — ABNORMAL LOW (ref 6.5–8.1)

## 2014-11-01 LAB — CBC WITH DIFFERENTIAL/PLATELET
Basophils Absolute: 0 10*3/uL (ref 0–0.1)
Basophils Relative: 0 %
EOS ABS: 0 10*3/uL (ref 0–0.7)
EOS PCT: 0 %
HCT: 39.6 % — ABNORMAL LOW (ref 40.0–52.0)
HEMOGLOBIN: 12.7 g/dL — AB (ref 13.0–18.0)
LYMPHS ABS: 0.3 10*3/uL — AB (ref 1.0–3.6)
LYMPHS PCT: 3 %
MCH: 31.6 pg (ref 26.0–34.0)
MCHC: 32.1 g/dL (ref 32.0–36.0)
MCV: 98.2 fL (ref 80.0–100.0)
MONO ABS: 0.4 10*3/uL (ref 0.2–1.0)
MONOS PCT: 4 %
Neutro Abs: 10.2 10*3/uL — ABNORMAL HIGH (ref 1.4–6.5)
Neutrophils Relative %: 93 %
PLATELETS: 261 10*3/uL (ref 150–440)
RBC: 4.03 MIL/uL — ABNORMAL LOW (ref 4.40–5.90)
RDW: 24.3 % — ABNORMAL HIGH (ref 11.5–14.5)
WBC: 11 10*3/uL — AB (ref 3.8–10.6)

## 2014-11-01 MED ORDER — GEMCITABINE HCL CHEMO INJECTION 1 GM/26.3ML
800.0000 mg/m2 | Freq: Once | INTRAVENOUS | Status: AC
  Administered 2014-11-01: 1634 mg via INTRAVENOUS
  Filled 2014-11-01: qty 37.72

## 2014-11-01 MED ORDER — SODIUM CHLORIDE 0.9 % IV SOLN
Freq: Once | INTRAVENOUS | Status: AC
  Administered 2014-11-01: 10:00:00 via INTRAVENOUS
  Filled 2014-11-01: qty 1000

## 2014-11-01 MED ORDER — KETOROLAC TROMETHAMINE 15 MG/ML IJ SOLN
15.0000 mg | Freq: Once | INTRAMUSCULAR | Status: AC
  Administered 2014-11-01: 15 mg via INTRAVENOUS

## 2014-11-01 MED ORDER — HYDROMORPHONE HCL 1 MG/ML IJ SOLN
1.0000 mg | Freq: Once | INTRAMUSCULAR | Status: AC
  Administered 2014-11-01: 1 mg via INTRAVENOUS
  Filled 2014-11-01: qty 1

## 2014-11-01 MED ORDER — VINORELBINE TARTRATE CHEMO INJECTION 50 MG/5ML
20.0000 mg/m2 | Freq: Once | INTRAVENOUS | Status: AC
  Administered 2014-11-01: 41 mg via INTRAVENOUS
  Filled 2014-11-01: qty 4.1

## 2014-11-01 MED ORDER — SODIUM CHLORIDE 0.9 % IV SOLN
Freq: Once | INTRAVENOUS | Status: AC
  Administered 2014-11-01: 11:00:00 via INTRAVENOUS
  Filled 2014-11-01: qty 8

## 2014-11-01 MED ORDER — KETOROLAC TROMETHAMINE 15 MG/ML IJ SOLN
15.0000 mg | Freq: Once | INTRAMUSCULAR | Status: AC
  Administered 2014-11-01: 15 mg via INTRAVENOUS
  Filled 2014-11-01: qty 1

## 2014-11-01 MED ORDER — HEPARIN SOD (PORK) LOCK FLUSH 100 UNIT/ML IV SOLN
500.0000 [IU] | Freq: Once | INTRAVENOUS | Status: AC | PRN
  Administered 2014-11-01: 500 [IU]
  Filled 2014-11-01: qty 5

## 2014-11-01 NOTE — Progress Notes (Signed)
Pt does not have living will.  Never smoked. New PICC line left arm.

## 2014-11-03 ENCOUNTER — Telehealth: Payer: Self-pay | Admitting: *Deleted

## 2014-11-03 DIAGNOSIS — C760 Malignant neoplasm of head, face and neck: Secondary | ICD-10-CM

## 2014-11-03 MED ORDER — OXYCODONE HCL 15 MG PO TABS: 15.0000 mg | ORAL_TABLET | Freq: Four times a day (QID) | ORAL | Status: DC | PRN

## 2014-11-03 NOTE — Telephone Encounter (Signed)
Refill for oxycodone ready for pickup. Pt notified.

## 2014-11-04 ENCOUNTER — Telehealth: Payer: Self-pay | Admitting: *Deleted

## 2014-11-04 NOTE — Telephone Encounter (Signed)
Patient started Gemzar on Monday.  States he was instructed to call if he had swelling in his joints.  Complains his ankles are swelling as well as his left elbow.  Per Dr. Oliva Bustard, this is a side effect of the drug and he will evaluate @ next appointment on Monday.  Patient verbalized understanding.

## 2014-11-05 ENCOUNTER — Encounter: Payer: Self-pay | Admitting: Oncology

## 2014-11-05 NOTE — Progress Notes (Signed)
[place] @ [facility] [phone]) [phone]  Fax:(336) [place] OB: [birth year]  MR#: [id number]  OVF#:[id number]  Patient Care Team: Crecencio Mc, MD as PCP - General (Internal Medicine) Algernon Huxley, MD as Consulting Physician (Vascular Surgery)  CHIEF COMPLAINT:  Chief Complaint  Patient presents with  . Follow-up    Oncology History   56. 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 Margins were clear but close at 5 mm. Depth of invasion was 0.9 cm. There was no lymph vascular spread there was positive perineural spread. 22 nodes were at excised all negative for metastatic disease). 2. Mediastinal/hilar Adenopathy on PET scan - EBUS procedure and biopsy at [facility] on 09/02/2011 negative for malignancy.  Also RLL lung biopsy at [facility] on 08/30/2011 negative for malignancy. 3. Patient had lower left cervical lymph node removed.  (Surgery was done at [facility]) followed by introperative radiation therapy.(May of 2014) 4  Ebus in May of 2014 insufficient tissue 5. 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 6. Suspected Recurrent disease in the neck patient was started on cis-platinum, cetuximab and radiation therapy July17, 2014 7. Deep Vein  thromboses in the left lower extremity on Xaralto 8. Finished radiation and chemotherapy December 13, 2012 9. Abnormal CT scan and a PET scan.  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 Patient started on 5-FU , Cis-platinum, docetaxel from February 11, 2013 has finished total  5  cycles of chemotherapy. 10. Progressing disease.  Started on palliative radiation treatment with cetuximab.  (February, 201 5) 11. Finished radiation then cetuximab in April of 2015. 12. On carboplatinum and Taxol   and cetuximab 13 Started on maintenance  cetuimab from January 21, 2014 14.progressive disease so started on  keytruda from March 15, 2014 15.ecent episode of thrombosis of left subclavian, left axillary veinpatient underwent thrombolytic therapy.  Removal of port.  (Because of infection)and started on Lovenox. 16.progressive disease on KEYTRUDA.  Palliative radiation therapy 17.palliative radiation therapy,march 201 18.  On IV methotrexate starting from April of 2016     Primary cancer of tongue   08/26/2012 Initial Diagnosis Primary cancer of tongue    Oncology Flowsheet 09/27/2014 10/04/2014 10/12/2014 10/15/2014 10/25/2014 10/27/2014 11/01/2014  Day, Cycle Day 1, Cycle 3 Day 1, Cycle 1 Day 1, Cycle 2 - - - Day 1, Cycle 1  dexamethasone (DECADRON) IV [ 10 mg ] [ 10 mg ] [ 10 mg ] - [ 10 mg ] - [ 20 mg ]  dexamethasone (DECADRON) PO - - - - - - -  enoxaparin (LOVENOX) [place] - - - - - 1 mg/kg -  gemcitabine (GEMZAR) IV - - - - - - 800 mg/m2  methotrexate (PF) IV 30 mg/m2 65 mg 65 mg - 65 mg - -  methylPREDNISolone sodium succinate 125 mg/2 mL (SOLU-MEDROL) IM - - - 60 mg - - -  ondansetron (ZOFRAN) IV [ 8 mg ] [ 8 mg ] [ 8 mg ] - [ 8 mg ] - [ 16 mg ]  vinorelbine (NAVELBINE) IV - - - - - - 20 mg/m2    INTERVAL HISTORY: 49 year old gentleman with carcinoma of head and neck stage IV disease.  Does not have any pain in lower extremity.  Had  a bone scan done which has been reviewed independently.  Shows increase uptake in unknown metastatic  site in clavicle.  And sternum.  But no evidence of metastases to the spine. Pain in the left upper chest for diarrhea continues.  Here for further follow-up and treatment consideration with methotrexate. 49 year old gentleman with recurrent head and neck cancer.  He has increasing pain in the left upper chest wall area started today.  With more drainage.  Patient is on methotrexate no soreness in the mouth November 01, 2014 Patient is here for ongoing evaluation  and consideration of chemotherapy   vinorelbine and gemcitabine. continues to have increasing discomfort and pain.  Increasing swelling in the neck.  Drainage.  Increased  As per HPI. Otherwise, a complete review of systems is negatve.  PAST MEDICAL HISTORY: Past Medical History  Diagnosis Date  . Allergy   . Cancer     tongue and neck  . DVT (deep venous thrombosis)     left arm and left leg    PAST SURGICAL HISTORY: Past Surgical History  Procedure Laterality Date  . Excision of tongue lesion with laser  08/13/11  . Neck dissection Bilateral 08/13/11  . Thoracotomy    . Peripheral vascular catheterization  10/27/2014    Procedure: PICC Line Insertion;  Surgeon: Katha Cabal, MD;  Location: [place] [facility];  Service: Cardiovascular;;    FAMILY HISTORY Family History  Problem Relation Age of Onset  . Cancer Father 37    prostate          ADVANCED DIRECTIVES: Advance care directive: Patient does have advanced healthcare directive and has been reviewed.  Patient does not want to change it at present time    HEALTH MAINTENANCE: History  Substance Use Topics  . Smoking status: Never Smoker   . Smokeless tobacco: Not on file  . Alcohol Use: No     Comment: very limited     Colonoscopy:  PAP:  Bone density:  Lipid panel:  Allergies  Allergen Reactions  . Azithromycin Other (See Comments)    Pt states he gets real flushed red and sweaty.  . Penicillins Hives    Childhood reaction reported to patient by mother.  . Sulfa Antibiotics     Childhood reactions  . Erythromycin Base Swelling    Current Outpatient Prescriptions  Medication Sig Dispense Refill  . azelastine (ASTELIN) 0.1 % nasal spray Place 1 spray into the nose 2 (two) times daily as needed. For allergies    . calcium-vitamin D (OSCAL WITH D) 500-200 MG-UNIT per tablet Take 1 tablet by mouth 2 (two) times daily. 60 tablet 3  . chlorhexidine (PERIDEX) 0.12 % solution Use as directed 15 mLs  in the mouth or throat 2 (two) times daily.    . citalopram (CELEXA) 20 MG tablet Take 1 tablet (20 mg total) by mouth at bedtime. 30 tablet 2  . dexamethasone (DECADRON) 4 MG tablet Take 2 tablets (8 mg total) by mouth daily. 30 tablet 0  . docusate sodium (COLACE) 100 MG capsule Take 100 mg by mouth daily as needed. for constipation.    Marland Kitchen doxycycline (VIBRA-TABS) 100 MG tablet Take 100 mg by mouth 2 (two) times daily.    Marland Kitchen enoxaparin (LOVENOX) 80 MG/0.8ML injection Inject 80 mg into the skin 2 (two) times daily.    . fentaNYL (DURAGESIC - DOSED MCG/HR) 100 MCG/HR Place 1 patch (100 mcg total) onto the skin every other day. 15 patch 0  . fentaNYL (  DURAGESIC - DOSED MCG/HR) 75 MCG/HR Place 1 patch (75 mcg total) onto the skin every other day. 15 patch 0  . fluticasone (FLONASE) 50 MCG/ACT nasal spray Place 2 sprays into the nose daily as needed. for allergies    . omeprazole (PRILOSEC OTC) 20 MG tablet Take 20 mg by mouth every morning.    . pregabalin (LYRICA) 75 MG capsule Take 1 capsule (75 mg total) by mouth 2 (two) times daily. 60 capsule 3  . levofloxacin (LEVAQUIN) 500 MG tablet Take 1 tablet (500 mg total) by mouth daily. (Patient not taking: Reported on 10/27/2014) 10 tablet 0  . oxyCODONE (ROXICODONE) 15 MG immediate release tablet Take 1-2 tablets (15-30 mg total) by mouth every 6 (six) hours as needed for pain. 120 tablet 0   No current facility-administered medications for this visit.    OBJECTIVE:  Filed Vitals:   11/01/14 0912  BP: 138/83  Pulse: 89  Temp: 97 F (36.1 C)     Body mass index is 23.62 kg/(m^2).    ECOG FS:2 - Symptomatic, <50% confined to bed  Review of system Gen. status: Performance status is declining. HEENT: Increasing swelling on the left side of the neck Increasing drainage Swelling of left upper extremity Lungs: Increasing shortness of breath cough Cardiac: No chest pain.  No shortness of breath.  GI: Poor appetite and losing weight.  GU: No  dysuria or hematuria.  Skin: No swelling.  No rash.  No ecchymoses.  Lower extremities no swelling.Neurological system: No dizziness.  No tingling.  His was.  no tingling numbness.  no focal weakness or any focal signs.  LAB RESULTS:  Infusion on 11/01/2014  Component Date Value Ref Range Status  . Sodium 11/01/2014 134* 135 - 145 mmol/L Final  . Potassium 11/01/2014 3.6  3.5 - 5.1 mmol/L Final  . Chloride 11/01/2014 99* 101 - 111 mmol/L Final  . CO2 11/01/2014 29  22 - 32 mmol/L Final  . Glucose, Bld 11/01/2014 149* 65 - 99 mg/dL Final  . BUN 11/01/2014 14  6 - 20 mg/dL Final  . Creatinine, Ser 11/01/2014 0.90  0.61 - 1.24 mg/dL Final  . Calcium 11/01/2014 8.8* 8.9 - 10.3 mg/dL Final  . Total Protein 11/01/2014 5.9* 6.5 - 8.1 g/dL Final  . Albumin 11/01/2014 2.8* 3.5 - 5.0 g/dL Final  . AST 11/01/2014 18  15 - 41 U/L Final  . ALT 11/01/2014 38  17 - 63 U/L Final  . Alkaline Phosphatase 11/01/2014 116  38 - 126 U/L Final  . Total Bilirubin 11/01/2014 0.5  0.3 - 1.2 mg/dL Final  . GFR calc non Af Amer 11/01/2014 >60  >60 mL/min Final  . GFR calc Af Amer 11/01/2014 >60  >60 mL/min Final   Comment: (NOTE) The eGFR has been calculated using the CKD EPI equation. This calculation has not been validated in all clinical situations. eGFR's persistently <60 mL/min signify possible Chronic Kidney Disease.   . Anion gap 11/01/2014 6  5 - 15 Final  . WBC 11/01/2014 11.0* 3.8 - 10.6 K/uL Final  . RBC 11/01/2014 4.03* 4.40 - 5.90 MIL/uL Final  . Hemoglobin 11/01/2014 12.7* 13.0 - 18.0 g/dL Final  . HCT 11/01/2014 39.6* 40.0 - 52.0 % Final  . MCV 11/01/2014 98.2  80.0 - 100.0 fL Final  . MCH 11/01/2014 31.6  26.0 - 34.0 pg Final  . MCHC 11/01/2014 32.1  32.0 - 36.0 g/dL Final  . RDW 11/01/2014 24.3* 11.5 - 14.5 % Final  .  Platelets 11/01/2014 261  150 - 440 K/uL Final  . Neutrophils Relative % 11/01/2014 93   Final  . Neutro Abs 11/01/2014 10.2* 1.4 - 6.5 K/uL Final  . Lymphocytes Relative  11/01/2014 3   Final  . Lymphs Abs 11/01/2014 0.3* 1.0 - 3.6 K/uL Final  . Monocytes Relative 11/01/2014 4   Final  . Monocytes Absolute 11/01/2014 0.4  0.2 - 1.0 K/uL Final  . Eosinophils Relative 11/01/2014 0   Final  . Eosinophils Absolute 11/01/2014 0.0  0 - 0.7 K/uL Final  . Basophils Relative 11/01/2014 0   Final  . Basophils Absolute 11/01/2014 0.0  0 - 0.1 K/uL Final     STUDIES: No results found.  ASSESSMENT: 49 year old gentleman with stage IV and recurrent and progressive carcinoma of tongue   on methotrexate. Lower extremity pain etiology not clear. Scan has been reviewed independently and is been negative for any metastases  Anemia. Continue Decadron 8 mg daily Will receive intravenous methotrexate next week We will need port placement because of vinorelbine  Possibility of progressive tumor cannot be ruled out.  Possibility of changing chemotherapy to navelbine gemcitabine. All the side effects of chemotherapy including myelosuppression, alopecia, nausea vomiting fatigue weakness.  Secondary infection, and   peripheral neuropathy .  Has been discussed in details. Informal consent has been obtained and will be documented by nurses in the chart. Intent of chemotherapy is palliation and relief in symptoms and extending survival     MEDICAL DECISION MAKING:  Laboratory data has been reviewed. Proceed with chemotherapy Patient had increasing pains of Toradol was given followed by Dilaudid Total duration of visit was 45 minutes.  50% or more time was spent in counseling patient and family regarding prognosis and options of treatment and available resources   Patient expressed understanding and was in agreement with this plan. He also understands that He can call clinic at any time with any questions, concerns, or complaints.    Primary cancer of tongue   Staging form: Lip and Oral Cavity, AJCC 7th Edition     Clinical: Stage IVC (T2, N2, M1) - Unsigned   [name], MD   11/05/2014 6:58 PM

## 2014-11-08 ENCOUNTER — Inpatient Hospital Stay (HOSPITAL_BASED_OUTPATIENT_CLINIC_OR_DEPARTMENT_OTHER): Payer: BC Managed Care – PPO | Admitting: Oncology

## 2014-11-08 ENCOUNTER — Ambulatory Visit: Payer: BC Managed Care – PPO

## 2014-11-08 ENCOUNTER — Encounter: Payer: Self-pay | Admitting: Oncology

## 2014-11-08 ENCOUNTER — Inpatient Hospital Stay: Payer: BC Managed Care – PPO

## 2014-11-08 VITALS — BP 133/94 | HR 74 | Temp 97.5°F | Wt 174.8 lb

## 2014-11-08 DIAGNOSIS — Z86718 Personal history of other venous thrombosis and embolism: Secondary | ICD-10-CM

## 2014-11-08 DIAGNOSIS — C029 Malignant neoplasm of tongue, unspecified: Secondary | ICD-10-CM | POA: Diagnosis not present

## 2014-11-08 DIAGNOSIS — Z7901 Long term (current) use of anticoagulants: Secondary | ICD-10-CM

## 2014-11-08 DIAGNOSIS — R0789 Other chest pain: Secondary | ICD-10-CM

## 2014-11-08 DIAGNOSIS — C760 Malignant neoplasm of head, face and neck: Secondary | ICD-10-CM

## 2014-11-08 DIAGNOSIS — Z923 Personal history of irradiation: Secondary | ICD-10-CM

## 2014-11-08 DIAGNOSIS — D649 Anemia, unspecified: Secondary | ICD-10-CM | POA: Diagnosis not present

## 2014-11-08 DIAGNOSIS — Z79899 Other long term (current) drug therapy: Secondary | ICD-10-CM

## 2014-11-08 DIAGNOSIS — R197 Diarrhea, unspecified: Secondary | ICD-10-CM

## 2014-11-08 DIAGNOSIS — M7989 Other specified soft tissue disorders: Secondary | ICD-10-CM

## 2014-11-08 DIAGNOSIS — C7951 Secondary malignant neoplasm of bone: Secondary | ICD-10-CM

## 2014-11-08 DIAGNOSIS — M79606 Pain in leg, unspecified: Secondary | ICD-10-CM

## 2014-11-08 DIAGNOSIS — C021 Malignant neoplasm of border of tongue: Secondary | ICD-10-CM

## 2014-11-08 DIAGNOSIS — Z9221 Personal history of antineoplastic chemotherapy: Secondary | ICD-10-CM

## 2014-11-08 DIAGNOSIS — R52 Pain, unspecified: Secondary | ICD-10-CM

## 2014-11-08 DIAGNOSIS — R5381 Other malaise: Secondary | ICD-10-CM

## 2014-11-08 DIAGNOSIS — R5383 Other fatigue: Secondary | ICD-10-CM

## 2014-11-08 DIAGNOSIS — R200 Anesthesia of skin: Secondary | ICD-10-CM

## 2014-11-08 DIAGNOSIS — R202 Paresthesia of skin: Secondary | ICD-10-CM

## 2014-11-08 DIAGNOSIS — R531 Weakness: Secondary | ICD-10-CM

## 2014-11-08 DIAGNOSIS — C8591 Non-Hodgkin lymphoma, unspecified, lymph nodes of head, face, and neck: Secondary | ICD-10-CM

## 2014-11-08 LAB — COMPREHENSIVE METABOLIC PANEL
ALT: 145 U/L — ABNORMAL HIGH (ref 17–63)
ANION GAP: 6 (ref 5–15)
AST: 35 U/L (ref 15–41)
Albumin: 2.6 g/dL — ABNORMAL LOW (ref 3.5–5.0)
Alkaline Phosphatase: 133 U/L — ABNORMAL HIGH (ref 38–126)
BUN: 15 mg/dL (ref 6–20)
CHLORIDE: 94 mmol/L — AB (ref 101–111)
CO2: 28 mmol/L (ref 22–32)
CREATININE: 0.7 mg/dL (ref 0.61–1.24)
Calcium: 8.3 mg/dL — ABNORMAL LOW (ref 8.9–10.3)
GFR calc non Af Amer: >60 mL/min (ref >60)
Glucose, Bld: 209 mg/dL — ABNORMAL HIGH (ref 65–99)
Potassium: 3.8 mmol/L (ref 3.5–5.1)
SODIUM: 128 mmol/L — AB (ref 135–145)
Total Bilirubin: 0.5 mg/dL (ref 0.3–1.2)
Total Protein: 5.9 g/dL — ABNORMAL LOW (ref 6.5–8.1)

## 2014-11-08 LAB — CBC WITH DIFFERENTIAL/PLATELET
Basophils Absolute: 0 10*3/uL (ref 0–0.1)
Basophils Relative: 0 %
EOS ABS: 0 10*3/uL (ref 0–0.7)
EOS PCT: 0 %
HCT: 37.9 % — ABNORMAL LOW (ref 40.0–52.0)
HEMOGLOBIN: 12.3 g/dL — AB (ref 13.0–18.0)
LYMPHS ABS: 0.1 10*3/uL — AB (ref 1.0–3.6)
Lymphocytes Relative: 2 %
MCH: 32 pg (ref 26.0–34.0)
MCHC: 32.4 g/dL (ref 32.0–36.0)
MCV: 98.6 fL (ref 80.0–100.0)
Monocytes Absolute: 0.1 10*3/uL — ABNORMAL LOW (ref 0.2–1.0)
Monocytes Relative: 2 %
NEUTROS ABS: 5.9 10*3/uL (ref 1.4–6.5)
Neutrophils Relative %: 96 %
Platelets: 103 10*3/uL — ABNORMAL LOW (ref 150–440)
RBC: 3.85 MIL/uL — AB (ref 4.40–5.90)
RDW: 22 % — AB (ref 11.5–14.5)
WBC: 6.2 10*3/uL (ref 3.8–10.6)

## 2014-11-08 MED ORDER — HYDROMORPHONE HCL 1 MG/ML IJ SOLN
1.0000 mg | Freq: Once | INTRAMUSCULAR | Status: AC
  Administered 2014-11-08: 1 mg via INTRAVENOUS
  Filled 2014-11-08: qty 1

## 2014-11-08 MED ORDER — SODIUM CHLORIDE 0.9 % IV SOLN
Freq: Once | INTRAVENOUS | Status: AC
  Administered 2014-11-08: 20 mL/h via INTRAVENOUS
  Filled 2014-11-08: qty 1000

## 2014-11-08 MED ORDER — KETOROLAC TROMETHAMINE 15 MG/ML IJ SOLN
15.0000 mg | Freq: Once | INTRAMUSCULAR | Status: AC
  Administered 2014-11-08: 15 mg via INTRAVENOUS
  Filled 2014-11-08: qty 1

## 2014-11-08 MED ORDER — SODIUM CHLORIDE 0.9 % IV SOLN
800.0000 mg/m2 | Freq: Once | INTRAVENOUS | Status: AC
  Administered 2014-11-08: 1634 mg via INTRAVENOUS
  Filled 2014-11-08: qty 37.72

## 2014-11-08 MED ORDER — FENTANYL 75 MCG/HR TD PT72
75.0000 ug | MEDICATED_PATCH | TRANSDERMAL | Status: DC
Start: 2014-11-15 — End: 2014-12-01

## 2014-11-08 MED ORDER — HEPARIN SOD (PORK) LOCK FLUSH 100 UNIT/ML IV SOLN
250.0000 [IU] | Freq: Once | INTRAVENOUS | Status: AC | PRN
  Administered 2014-11-08: 250 [IU]

## 2014-11-08 MED ORDER — VINORELBINE TARTRATE CHEMO INJECTION 50 MG/5ML
20.0000 mg/m2 | Freq: Once | INTRAVENOUS | Status: AC
  Administered 2014-11-08: 41 mg via INTRAVENOUS
  Filled 2014-11-08: qty 4.1

## 2014-11-08 MED ORDER — FENTANYL 100 MCG/HR TD PT72: 100.0000 ug | MEDICATED_PATCH | TRANSDERMAL | Status: DC

## 2014-11-08 MED ORDER — SODIUM CHLORIDE 0.9 % IV SOLN
Freq: Once | INTRAVENOUS | Status: AC
  Administered 2014-11-08: 15:00:00 via INTRAVENOUS
  Filled 2014-11-08: qty 8

## 2014-11-08 NOTE — Progress Notes (Signed)
Patient does not have living will.  Never smoked. 

## 2014-11-12 ENCOUNTER — Encounter: Payer: Self-pay | Admitting: Oncology

## 2014-11-12 NOTE — Progress Notes (Signed)
[place] @ [facility] [phone]  Fax:(336) [place] OB: [birth year]  MR#: [id number]  PFX#:[id number]  Patient Care Team: Crecencio Mc, MD as PCP - General (Internal Medicine) Algernon Huxley, MD as Consulting Physician (Vascular Surgery)  CHIEF COMPLAINT:  Chief Complaint  Patient presents with  . Follow-up    Oncology History   40. 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 Margins were clear but close at 5 mm. Depth of invasion was 0.9 cm. There was no lymph vascular spread there was positive perineural spread. 22 nodes were at excised all negative for metastatic disease). 2. Mediastinal/hilar Adenopathy on PET scan - EBUS procedure and biopsy at [facility] on 09/02/2011 negative for malignancy.  Also RLL lung biopsy at [facility] on 08/30/2011 negative for malignancy. 3. Patient had lower left cervical lymph node removed.  (Surgery was done at [facility]) followed by introperative radiation therapy.(May of 2014) 4  Ebus in May of 2014 insufficient tissue 5. 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 6. Suspected Recurrent disease in the neck patient was started on cis-platinum, cetuximab and radiation therapy July17, 2014 7. Deep Vein  thromboses in the left lower extremity on Xaralto 8. Finished radiation and chemotherapy December 13, 2012 9. Abnormal CT scan and a PET scan.  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 Patient started on 5-FU , Cis-platinum, docetaxel from February 11, 2013 has finished total  5  cycles of chemotherapy. 10. Progressing disease.  Started on palliative radiation treatment with cetuximab.  (February, 201 5) 11. Finished radiation then cetuximab in April of 2015. 12. On carboplatinum and Taxol   and cetuximab 13 Started on maintenance  cetuimab from January 21, 2014 14.progressive disease so started on  keytruda from March 15, 2014 15.ecent episode of thrombosis of left subclavian, left axillary veinpatient underwent thrombolytic therapy.  Removal of port.  (Because of infection)and started on Lovenox. 16.progressive disease on KEYTRUDA.  Palliative radiation therapy 17.palliative radiation therapy,march 201 18.  On IV methotrexate starting from April of 2016 19 progressive disease on methotrexate so patient was started on gemcitabine and vinorelbine from June of 2016     Primary cancer of tongue   08/26/2012 Initial Diagnosis Primary cancer of tongue    Oncology Flowsheet 10/04/2014 10/12/2014 10/15/2014 10/25/2014 10/27/2014 11/01/2014 11/08/2014  Day, Cycle Day 1, Cycle 1 Day 1, Cycle 2 - - - Day 1, Cycle 1 Day 8, Cycle 1  dexamethasone (DECADRON) IV [ 10 mg ] [ 10 mg ] - [ 10 mg ] - [ 20 mg ] [ 20 mg ]  dexamethasone (DECADRON) PO - - - - - - -  enoxaparin (LOVENOX) [place] - - - - 1 mg/kg - -  gemcitabine (GEMZAR) IV - - - - - 800 mg/m2 800 mg/m2  methotrexate (PF) IV 65 mg 65 mg - 65 mg - - -  methylPREDNISolone sodium succinate 125 mg/2 mL (SOLU-MEDROL) IM - - 60 mg - - - -  ondansetron (ZOFRAN) IV [ 8 mg ] [ 8 mg ] - [ 8 mg ] - [ 16 mg ] [ 16 mg ]  vinorelbine (NAVELBINE) IV - - - - - 20 mg/m2 20 mg/m2    INTERVAL HISTORY: 49 year old gentleman with carcinoma  of head and neck stage IV disease.  Does not have any pain in lower extremity.  Had a bone scan done which has been reviewed independently.  Shows increase uptake in unknown metastatic  site in clavicle.  And sternum.  But no evidence of metastases to the spine. Pain in the left upper chest for diarrhea continues.  Here for further follow-up and treatment consideration with methotrexate. 49 year old gentleman with recurrent head and neck cancer.  He has increasing pain in the left upper chest wall area started today.  With  more drainage.  Patient is on methotrexate no soreness in the mouth November 01, 2014 Patient is here for ongoing evaluation and consideration of chemotherapy   vinorelbine and gemcitabine. continues to have increasing discomfort and pain.  Increasing swelling in the neck.  Drainage.  Increased November 08, 2014 Patient is here for ongoing evaluation and treatment consideration.  Left-sided neck mass is stable.  Pain continues to bother patient.  Patient has started new chemotherapy with gemcitabine and never been.  He is constant going in the left side of the neck and the left upper extremity. She has noticed some more swelling of the left upper extremity.  Patient also is on Lovenox.  As per HPI. Otherwise, a complete review of systems is negatve.  PAST MEDICAL HISTORY: Past Medical History  Diagnosis Date  . Allergy   . Cancer     tongue and neck  . DVT (deep venous thrombosis)     left arm and left leg    PAST SURGICAL HISTORY: Past Surgical History  Procedure Laterality Date  . Excision of tongue lesion with laser  08/13/11  . Neck dissection Bilateral 08/13/11  . Thoracotomy    . Peripheral vascular catheterization  10/27/2014    Procedure: PICC Line Insertion;  Surgeon: Katha Cabal, MD;  Location: [place] [facility];  Service: Cardiovascular;;    FAMILY HISTORY Family History  Problem Relation Age of Onset  . Cancer Father 22    prostate          ADVANCED DIRECTIVES: Advance care directive: Patient does have advanced healthcare directive and has been reviewed.  Patient does not want to change it at present time    HEALTH MAINTENANCE: History  Substance Use Topics  . Smoking status: Never Smoker   . Smokeless tobacco: Not on file  . Alcohol Use: No     Comment: very limited     Colonoscopy:  PAP:  Bone density:  Lipid panel:  Allergies  Allergen Reactions  . Azithromycin Other (See Comments)    Pt states he gets real flushed red and sweaty.  .  Penicillins Hives    Childhood reaction reported to patient by mother.  . Sulfa Antibiotics     Childhood reactions  . Erythromycin Base Swelling    Current Outpatient Prescriptions  Medication Sig Dispense Refill  . azelastine (ASTELIN) 0.1 % nasal spray Place 1 spray into the nose 2 (two) times daily as needed. For allergies    . calcium-vitamin D (OSCAL WITH D) 500-200 MG-UNIT per tablet Take 1 tablet by mouth 2 (two) times daily. 60 tablet 3  . chlorhexidine (PERIDEX) 0.12 % solution Use as directed 15 mLs in the mouth or throat 2 (two) times daily.    . citalopram (CELEXA) 20 MG tablet Take 1 tablet (20 mg total) by mouth at bedtime. 30 tablet 2  . dexamethasone (DECADRON) 4 MG tablet Take 2 tablets (8 mg total) by mouth daily.  30 tablet 0  . docusate sodium (COLACE) 100 MG capsule Take 100 mg by mouth daily as needed. for constipation.    Marland Kitchen doxycycline (VIBRA-TABS) 100 MG tablet Take 100 mg by mouth 2 (two) times daily.    Marland Kitchen enoxaparin (LOVENOX) 80 MG/0.8ML injection Inject 80 mg into the skin 2 (two) times daily.    Derrill Memo ON 11/15/2014] fentaNYL (DURAGESIC - DOSED MCG/HR) 100 MCG/HR Place 1 patch (100 mcg total) onto the skin every other day. 15 patch 0  . [START ON 11/15/2014] fentaNYL (DURAGESIC - DOSED MCG/HR) 75 MCG/HR Place 1 patch (75 mcg total) onto the skin every other day. 15 patch 0  . fluticasone (FLONASE) 50 MCG/ACT nasal spray Place 2 sprays into the nose daily as needed. for allergies    . levofloxacin (LEVAQUIN) 500 MG tablet Take 1 tablet (500 mg total) by mouth daily. 10 tablet 0  . omeprazole (PRILOSEC OTC) 20 MG tablet Take 20 mg by mouth every morning.    Marland Kitchen oxyCODONE (ROXICODONE) 15 MG immediate release tablet Take 1-2 tablets (15-30 mg total) by mouth every 6 (six) hours as needed for pain. 120 tablet 0  . pregabalin (LYRICA) 75 MG capsule Take 1 capsule (75 mg total) by mouth 2 (two) times daily. 60 capsule 3   No current facility-administered medications for this  visit.    OBJECTIVE:  Filed Vitals:   11/08/14 1400  BP: 133/94  Pulse: 74  Temp: 97.5 F (36.4 C)     Body mass index is 23.07 kg/(m^2).    ECOG FS:2 - Symptomatic, <50% confined to bed  Review of system Gen. status: Performance status is declining. HEENT: Increasing swelling on the left side of the neck Increasing drainage Swelling of left upper extremity Lungs: Increasing shortness of breath cough Cardiac: No chest pain.  No shortness of breath.  GI: Poor appetite and losing weight.  GU: No dysuria or hematuria.  Skin: No swelling.  No rash.  No ecchymoses.  Lower extremities no swelling.Neurological system: No dizziness.  No tingling.  His was.  no tingling numbness.  no focal weakness or any focal signs.\ Overall declining performance status  LAB RESULTS:  Office Visit on 11/08/2014  Component Date Value Ref Range Status  . WBC 11/08/2014 6.2  3.8 - 10.6 K/uL Final  . RBC 11/08/2014 3.85* 4.40 - 5.90 MIL/uL Final  . Hemoglobin 11/08/2014 12.3* 13.0 - 18.0 g/dL Final  . HCT 11/08/2014 37.9* 40.0 - 52.0 % Final  . MCV 11/08/2014 98.6  80.0 - 100.0 fL Final  . MCH 11/08/2014 32.0  26.0 - 34.0 pg Final  . MCHC 11/08/2014 32.4  32.0 - 36.0 g/dL Final  . RDW 11/08/2014 22.0* 11.5 - 14.5 % Final  . Platelets 11/08/2014 103* 150 - 440 K/uL Final  . Neutrophils Relative % 11/08/2014 96   Final  . Neutro Abs 11/08/2014 5.9  1.4 - 6.5 K/uL Final  . Lymphocytes Relative 11/08/2014 2   Final  . Lymphs Abs 11/08/2014 0.1* 1.0 - 3.6 K/uL Final  . Monocytes Relative 11/08/2014 2   Final  . Monocytes Absolute 11/08/2014 0.1* 0.2 - 1.0 K/uL Final  . Eosinophils Relative 11/08/2014 0   Final  . Eosinophils Absolute 11/08/2014 0.0  0 - 0.7 K/uL Final  . Basophils Relative 11/08/2014 0   Final  . Basophils Absolute 11/08/2014 0.0  0 - 0.1 K/uL Final  . Sodium 11/08/2014 128* 135 - 145 mmol/L Final  . Potassium 11/08/2014 3.8  3.5 -  5.1 mmol/L Final  . Chloride 11/08/2014 94* 101 - 111  mmol/L Final  . CO2 11/08/2014 28  22 - 32 mmol/L Final  . Glucose, Bld 11/08/2014 209* 65 - 99 mg/dL Final  . BUN 11/08/2014 15  6 - 20 mg/dL Final  . Creatinine, Ser 11/08/2014 0.70  0.61 - 1.24 mg/dL Final  . Calcium 11/08/2014 8.3* 8.9 - 10.3 mg/dL Final  . Total Protein 11/08/2014 5.9* 6.5 - 8.1 g/dL Final  . Albumin 11/08/2014 2.6* 3.5 - 5.0 g/dL Final  . AST 11/08/2014 35  15 - 41 U/L Final  . ALT 11/08/2014 145* 17 - 63 U/L Final  . Alkaline Phosphatase 11/08/2014 133* 38 - 126 U/L Final  . Total Bilirubin 11/08/2014 0.5  0.3 - 1.2 mg/dL Final  . GFR calc non Af Amer 11/08/2014 >60  >60 mL/min Final  . GFR calc Af Amer 11/08/2014 >60  >60 mL/min Final   Comment: (NOTE) The eGFR has been calculated using the CKD EPI equation. This calculation has not been validated in all clinical situations. eGFR's persistently <60 mL/min signify possible Chronic Kidney Disease.   . Anion gap 11/08/2014 6  5 - 15 Final     STUDIES: No results found.  ASSESSMENT: 49 year old gentleman with stage IV and recurrent and progressive carcinoma of tongue Progressing disease started on gemcitabine and now been Patient was instructed to decrease Decadron to 4 mg with pain continues to bother go back on 8 mg    Patient is here to initiate second cycle of gemcitabine and   NAVELBINE  He did receive Toradol and Dilaudid for pain.  Possibility of progressive tumor cannot be ruled out.  Possibility of changing chemotherapy to navelbine gemcitabine. All the side effects of chemotherapy including myelosuppression, alopecia, nausea vomiting fatigue weakness.  Secondary infection, and   peripheral neuropathy .  Has been discussed in details. Informal consent has been obtained and will be documented by nurses in the chart. Intent of chemotherapy is palliation and relief in symptoms and extending survival     MEDICAL DECISION MAKING:  Laboratory data has been reviewed. Proceed with  chemotherapy Patient had increasing pains of Toradol was given followed by Dilaudid  Patient expressed understanding and was in agreement with this plan. He also understands that He can call clinic at any time with any questions, concerns, or complaints.    Primary cancer of tongue   Staging form: Lip and Oral Cavity, AJCC 7th Edition     Clinical: Stage IVC (T2, N2, M1) - Unsigned   [name], MD   11/12/2014 8:46 AM

## 2014-11-19 ENCOUNTER — Inpatient Hospital Stay: Payer: BC Managed Care – PPO | Attending: Oncology

## 2014-11-19 ENCOUNTER — Telehealth: Payer: Self-pay | Admitting: *Deleted

## 2014-11-19 ENCOUNTER — Other Ambulatory Visit: Payer: Self-pay | Admitting: Family Medicine

## 2014-11-19 DIAGNOSIS — C021 Malignant neoplasm of border of tongue: Secondary | ICD-10-CM | POA: Insufficient documentation

## 2014-11-19 DIAGNOSIS — M7989 Other specified soft tissue disorders: Secondary | ICD-10-CM | POA: Diagnosis not present

## 2014-11-19 DIAGNOSIS — C7951 Secondary malignant neoplasm of bone: Secondary | ICD-10-CM | POA: Diagnosis not present

## 2014-11-19 DIAGNOSIS — R220 Localized swelling, mass and lump, head: Secondary | ICD-10-CM | POA: Insufficient documentation

## 2014-11-19 DIAGNOSIS — R630 Anorexia: Secondary | ICD-10-CM | POA: Insufficient documentation

## 2014-11-19 DIAGNOSIS — R0602 Shortness of breath: Secondary | ICD-10-CM | POA: Diagnosis not present

## 2014-11-19 DIAGNOSIS — Z923 Personal history of irradiation: Secondary | ICD-10-CM | POA: Diagnosis not present

## 2014-11-19 DIAGNOSIS — D500 Iron deficiency anemia secondary to blood loss (chronic): Secondary | ICD-10-CM | POA: Diagnosis not present

## 2014-11-19 DIAGNOSIS — R634 Abnormal weight loss: Secondary | ICD-10-CM | POA: Insufficient documentation

## 2014-11-19 DIAGNOSIS — C770 Secondary and unspecified malignant neoplasm of lymph nodes of head, face and neck: Secondary | ICD-10-CM | POA: Insufficient documentation

## 2014-11-19 DIAGNOSIS — Z86718 Personal history of other venous thrombosis and embolism: Secondary | ICD-10-CM | POA: Diagnosis not present

## 2014-11-19 DIAGNOSIS — Z9221 Personal history of antineoplastic chemotherapy: Secondary | ICD-10-CM | POA: Diagnosis not present

## 2014-11-19 DIAGNOSIS — R531 Weakness: Secondary | ICD-10-CM | POA: Diagnosis not present

## 2014-11-19 DIAGNOSIS — C029 Malignant neoplasm of tongue, unspecified: Secondary | ICD-10-CM

## 2014-11-19 DIAGNOSIS — R221 Localized swelling, mass and lump, neck: Secondary | ICD-10-CM | POA: Diagnosis not present

## 2014-11-19 DIAGNOSIS — R58 Hemorrhage, not elsewhere classified: Secondary | ICD-10-CM | POA: Diagnosis not present

## 2014-11-19 DIAGNOSIS — Z79899 Other long term (current) drug therapy: Secondary | ICD-10-CM | POA: Diagnosis not present

## 2014-11-19 DIAGNOSIS — G893 Neoplasm related pain (acute) (chronic): Secondary | ICD-10-CM | POA: Insufficient documentation

## 2014-11-19 MED ORDER — HEPARIN SOD (PORK) LOCK FLUSH 100 UNIT/ML IV SOLN
INTRAVENOUS | Status: AC
  Filled 2014-11-19: qty 5

## 2014-11-19 MED ORDER — DEXAMETHASONE SODIUM PHOSPHATE 10 MG/ML IJ SOLN
10.0000 mg | Freq: Once | INTRAMUSCULAR | Status: AC
  Administered 2014-11-19: 10 mg via INTRAVENOUS
  Filled 2014-11-19: qty 1

## 2014-11-19 MED ORDER — KETOROLAC TROMETHAMINE 30 MG/ML IJ SOLN
30.0000 mg | Freq: Once | INTRAMUSCULAR | Status: AC
  Administered 2014-11-19: 30 mg via INTRAVENOUS
  Filled 2014-11-19: qty 1

## 2014-11-19 MED ORDER — HYDROMORPHONE HCL 2 MG/ML IJ SOLN
1.0000 mg | Freq: Once | INTRAMUSCULAR | Status: AC
  Administered 2014-11-19: 1 mg via INTRAVENOUS

## 2014-11-19 NOTE — Telephone Encounter (Signed)
Appt for 11AM given adn agreed on

## 2014-11-22 ENCOUNTER — Inpatient Hospital Stay: Payer: BC Managed Care – PPO

## 2014-11-22 ENCOUNTER — Inpatient Hospital Stay (HOSPITAL_BASED_OUTPATIENT_CLINIC_OR_DEPARTMENT_OTHER): Payer: BC Managed Care – PPO | Admitting: Oncology

## 2014-11-22 ENCOUNTER — Encounter: Payer: Self-pay | Admitting: Oncology

## 2014-11-22 VITALS — BP 129/77 | HR 105 | Temp 98.2°F | Wt 179.2 lb

## 2014-11-22 DIAGNOSIS — C770 Secondary and unspecified malignant neoplasm of lymph nodes of head, face and neck: Secondary | ICD-10-CM

## 2014-11-22 DIAGNOSIS — Z86718 Personal history of other venous thrombosis and embolism: Secondary | ICD-10-CM

## 2014-11-22 DIAGNOSIS — Z923 Personal history of irradiation: Secondary | ICD-10-CM

## 2014-11-22 DIAGNOSIS — Z79899 Other long term (current) drug therapy: Secondary | ICD-10-CM

## 2014-11-22 DIAGNOSIS — Z9221 Personal history of antineoplastic chemotherapy: Secondary | ICD-10-CM | POA: Diagnosis not present

## 2014-11-22 DIAGNOSIS — G893 Neoplasm related pain (acute) (chronic): Secondary | ICD-10-CM

## 2014-11-22 DIAGNOSIS — R220 Localized swelling, mass and lump, head: Secondary | ICD-10-CM

## 2014-11-22 DIAGNOSIS — R221 Localized swelling, mass and lump, neck: Secondary | ICD-10-CM

## 2014-11-22 DIAGNOSIS — C021 Malignant neoplasm of border of tongue: Secondary | ICD-10-CM | POA: Diagnosis not present

## 2014-11-22 DIAGNOSIS — C7951 Secondary malignant neoplasm of bone: Secondary | ICD-10-CM

## 2014-11-22 DIAGNOSIS — M7989 Other specified soft tissue disorders: Secondary | ICD-10-CM

## 2014-11-22 DIAGNOSIS — C760 Malignant neoplasm of head, face and neck: Secondary | ICD-10-CM

## 2014-11-22 LAB — COMPREHENSIVE METABOLIC PANEL
ALT: 71 U/L — AB (ref 17–63)
ANION GAP: 6 (ref 5–15)
AST: 17 U/L (ref 15–41)
Albumin: 2.4 g/dL — ABNORMAL LOW (ref 3.5–5.0)
Alkaline Phosphatase: 165 U/L — ABNORMAL HIGH (ref 38–126)
BILIRUBIN TOTAL: 0.5 mg/dL (ref 0.3–1.2)
BUN: 10 mg/dL (ref 6–20)
CO2: 30 mmol/L (ref 22–32)
CREATININE: 0.71 mg/dL (ref 0.61–1.24)
Calcium: 7.7 mg/dL — ABNORMAL LOW (ref 8.9–10.3)
Chloride: 94 mmol/L — ABNORMAL LOW (ref 101–111)
GFR calc Af Amer: >60 mL/min (ref >60)
GFR calc non Af Amer: >60 mL/min (ref >60)
GLUCOSE: 95 mg/dL (ref 65–99)
Potassium: 3.5 mmol/L (ref 3.5–5.1)
SODIUM: 130 mmol/L — AB (ref 135–145)
Total Protein: 5.4 g/dL — ABNORMAL LOW (ref 6.5–8.1)

## 2014-11-22 LAB — CBC WITH DIFFERENTIAL/PLATELET
BASOS ABS: 0 10*3/uL (ref 0–0.1)
Basophils Relative: 0 %
EOS PCT: 0 %
Eosinophils Absolute: 0 10*3/uL (ref 0–0.7)
HCT: 33.8 % — ABNORMAL LOW (ref 40.0–52.0)
HEMOGLOBIN: 10.9 g/dL — AB (ref 13.0–18.0)
LYMPHS PCT: 4 %
Lymphs Abs: 0.4 10*3/uL — ABNORMAL LOW (ref 1.0–3.6)
MCH: 32.2 pg (ref 26.0–34.0)
MCHC: 32.1 g/dL (ref 32.0–36.0)
MCV: 100.3 fL — ABNORMAL HIGH (ref 80.0–100.0)
MONO ABS: 0.5 10*3/uL (ref 0.2–1.0)
Monocytes Relative: 5 %
Neutro Abs: 9.8 10*3/uL — ABNORMAL HIGH (ref 1.4–6.5)
Neutrophils Relative %: 91 %
PLATELETS: 279 10*3/uL (ref 150–440)
RBC: 3.37 MIL/uL — ABNORMAL LOW (ref 4.40–5.90)
RDW: 21.6 % — ABNORMAL HIGH (ref 11.5–14.5)
WBC: 10.7 10*3/uL — ABNORMAL HIGH (ref 3.8–10.6)

## 2014-11-22 MED ORDER — HYDROMORPHONE HCL 1 MG/ML IJ SOLN
1.0000 mg | Freq: Once | INTRAMUSCULAR | Status: AC
  Administered 2014-11-22: 1 mg via INTRAVENOUS
  Filled 2014-11-22: qty 1

## 2014-11-22 MED ORDER — HEPARIN SOD (PORK) LOCK FLUSH 100 UNIT/ML IV SOLN
INTRAVENOUS | Status: AC
  Filled 2014-11-22: qty 5

## 2014-11-22 MED ORDER — LEVOFLOXACIN 500 MG PO TABS: 500.0000 mg | ORAL_TABLET | Freq: Every day | ORAL | Status: DC

## 2014-11-22 MED ORDER — FENTANYL 25 MCG/HR TD PT72: 25.0000 ug | MEDICATED_PATCH | TRANSDERMAL | Status: DC

## 2014-11-22 MED ORDER — KETOROLAC TROMETHAMINE 30 MG/ML IJ SOLN
30.0000 mg | Freq: Once | INTRAMUSCULAR | Status: AC
  Administered 2014-11-22: 30 mg via INTRAVENOUS
  Filled 2014-11-22: qty 1

## 2014-11-22 MED ORDER — HYDROMORPHONE HCL 2 MG PO TABS: 2.0000 mg | ORAL_TABLET | ORAL | Status: DC | PRN

## 2014-11-22 MED ORDER — SODIUM CHLORIDE 0.9 % IV SOLN
16.0000 mg | Freq: Once | INTRAVENOUS | Status: AC
  Administered 2014-11-22: 16 mg via INTRAVENOUS
  Filled 2014-11-22: qty 1.6

## 2014-11-22 NOTE — Progress Notes (Signed)
Patient does not have living will.  Declined information.

## 2014-11-22 NOTE — Progress Notes (Signed)
[place] @ [facility] [phone]  [phone] [place] OB: 1966-03-27  MR#: [id number]  DXI#:[id number]  Patient Care Team: Crecencio Mc, MD as PCP - General (Internal Medicine) Algernon Huxley, MD as Consulting Physician (Vascular Surgery)  CHIEF COMPLAINT:  Chief Complaint  Patient presents with  . Follow-up    Oncology History   53. 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 Margins were clear but close at 5 mm. Depth of invasion was 0.9 cm. There was no lymph vascular spread there was positive perineural spread. 22 nodes were at excised all negative for metastatic disease). 2. Mediastinal/hilar Adenopathy on PET scan - EBUS procedure and biopsy at [facility] on 09/02/2011 negative for malignancy.  Also RLL lung biopsy at [facility] on 08/30/2011 negative for malignancy. 3. Patient had lower left cervical lymph node removed.  (Surgery was done at [facility]) followed by introperative radiation therapy.(May of 2014) 4  Ebus in May of 2014 insufficient tissue 5. 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 6. Suspected Recurrent disease in the neck patient was started on cis-platinum, cetuximab and radiation therapy July17, 2014 7. Deep Vein  thromboses in the left lower extremity on Xaralto 8. Finished radiation and chemotherapy December 13, 2012 9. Abnormal CT scan and a PET scan.  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 Patient started on 5-FU , Cis-platinum, docetaxel from February 11, 2013 has finished total  5  cycles of chemotherapy. 10. Progressing disease.  Started on palliative radiation treatment with cetuximab.  (February, 201 5) 11. Finished radiation then cetuximab in April of 2015. 12. On carboplatinum and Taxol   and cetuximab 13 Started on maintenance  cetuimab from January 21, 2014 14.progressive disease so started on  keytruda from March 15, 2014 15.ecent episode of thrombosis of left subclavian, left axillary veinpatient underwent thrombolytic therapy.  Removal of port.  (Because of infection)and started on Lovenox. 16.progressive disease on KEYTRUDA.  Palliative radiation therapy 17.palliative radiation therapy,march 201 18.  On IV methotrexate starting from April of 2016 19 progressive disease on methotrexate so patient was started on gemcitabine and vinorelbine from June of 2016     Primary cancer of tongue   08/26/2012 Initial Diagnosis Primary cancer of tongue    Oncology Flowsheet 10/15/2014 10/25/2014 10/27/2014 11/01/2014 11/08/2014 11/19/2014 11/22/2014  Day, Cycle - - - Day 1, Cycle 1 Day 8, Cycle 1 - -  dexamethasone (DECADRON) IV - [ 10 mg ] - [ 20 mg ] [ 20 mg ] 10 mg 16 mg  dexamethasone (DECADRON) PO - - - - - - -  enoxaparin (LOVENOX) [place] - - 1 mg/kg - - - -  gemcitabine (GEMZAR) IV - - - 800 mg/m2 800 mg/m2 - -  methotrexate (PF) IV - 65 mg - - - - -  methylPREDNISolone sodium succinate 125 mg/2 mL (SOLU-MEDROL) IM 60 mg - - - - - -  ondansetron (ZOFRAN) IV - [ 8 mg ] - [ 16 mg ] [ 16 mg ] - -  vinorelbine (NAVELBINE) IV - - - 20 mg/m2 20 mg/m2 - -    INTERVAL HISTORY: 49 year old gentleman with carcinoma of head and neck stage IV disease.  Does not have any pain in lower extremity.  Had  a bone scan done which has been reviewed independently.  Shows increase uptake in unknown metastatic  site in clavicle.  And sternum.  But no evidence of metastases to the spine. Pain in the left upper chest for diarrhea continues.  Here for further follow-up and treatment consideration with methotrexate. 49 year old gentleman with recurrent head and neck cancer.  He has increasing pain in the left upper chest wall area started today.  With more drainage.  Patient is on methotrexate no soreness in  the mouth November 01, 2014 Patient is here for ongoing evaluation and consideration of chemotherapy   vinorelbine and gemcitabine. continues to have increasing discomfort and pain.  Increasing swelling in the neck.  Drainage.  Increased November 08, 2014 Patient is here for ongoing evaluation and treatment consideration.  Left-sided neck mass is stable.  Pain continues to bother patient.  Patient has started new chemotherapy with gemcitabine and never been.  He is constant going in the left side of the neck and the left upper extremity. She has noticed some more swelling of the left upper extremity.  Patient also is on Lovenox. November 22, 2014 Patient has progressive increase in the pain on the left side of the face increasing swelling patient also came on last Friday intravenous Toradol was given.  Pain was constant dull aching Left side of the face has been swollen   As per HPI. Otherwise, a complete review of systems is negatve.  PAST MEDICAL HISTORY: Past Medical History  Diagnosis Date  . Allergy   . Cancer     tongue and neck  . DVT (deep venous thrombosis)     left arm and left leg    PAST SURGICAL HISTORY: Past Surgical History  Procedure Laterality Date  . Excision of tongue lesion with laser  08/13/11  . Neck dissection Bilateral 08/13/11  . Thoracotomy    . Peripheral vascular catheterization  10/27/2014    Procedure: PICC Line Insertion;  Surgeon: Katha Cabal, MD;  Location: [facility];  Service: Cardiovascular;;    FAMILY HISTORY Family History  Problem Relation Age of Onset  . Cancer Father 78    prostate          ADVANCED DIRECTIVES: Advance care directive: Patient does have advanced healthcare directive and has been reviewed.  Patient does not want to change it at present time    HEALTH MAINTENANCE: History  Substance Use Topics  . Smoking status: Never Smoker   . Smokeless tobacco: Not on file  . Alcohol Use: No     Comment: very limited      Colonoscopy:  PAP:  Bone density:  Lipid panel:  Allergies  Allergen Reactions  . Azithromycin Other (See Comments)    Pt states he gets real flushed red and sweaty.  . Penicillins Hives    Childhood reaction reported to patient by mother.  . Sulfa Antibiotics     Childhood reactions  . Erythromycin Base Swelling    Current Outpatient Prescriptions  Medication Sig Dispense Refill  . azelastine (ASTELIN) 0.1 % nasal spray Place 1 spray into the nose 2 (two) times daily as needed. For allergies    . calcium-vitamin D (OSCAL WITH D) 500-200 MG-UNIT per tablet Take 1 tablet by mouth 2 (two) times daily. 60 tablet 3  . chlorhexidine (PERIDEX) 0.12 % solution Use as directed 15 mLs in the mouth or throat 2 (two) times daily.    . citalopram (CELEXA) 20 MG tablet Take 1 tablet (  20 mg total) by mouth at bedtime. 30 tablet 2  . dexamethasone (DECADRON) 4 MG tablet Take 2 tablets (8 mg total) by mouth daily. 30 tablet 0  . docusate sodium (COLACE) 100 MG capsule Take 100 mg by mouth daily as needed. for constipation.    Marland Kitchen doxycycline (VIBRA-TABS) 100 MG tablet Take 100 mg by mouth 2 (two) times daily.    Marland Kitchen enoxaparin (LOVENOX) 80 MG/0.8ML injection Inject 80 mg into the skin 2 (two) times daily.    . fentaNYL (DURAGESIC - DOSED MCG/HR) 100 MCG/HR Place 1 patch (100 mcg total) onto the skin every other day. 15 patch 0  . fentaNYL (DURAGESIC - DOSED MCG/HR) 75 MCG/HR Place 1 patch (75 mcg total) onto the skin every other day. 15 patch 0  . fluticasone (FLONASE) 50 MCG/ACT nasal spray Place 2 sprays into the nose daily as needed. for allergies    . levofloxacin (LEVAQUIN) 500 MG tablet Take 1 tablet (500 mg total) by mouth daily. 7 tablet 0  . omeprazole (PRILOSEC OTC) 20 MG tablet Take 20 mg by mouth every morning.    Marland Kitchen oxyCODONE (ROXICODONE) 15 MG immediate release tablet Take 1-2 tablets (15-30 mg total) by mouth every 6 (six) hours as needed for pain. 120 tablet 0  . pregabalin (LYRICA)  75 MG capsule Take 1 capsule (75 mg total) by mouth 2 (two) times daily. 60 capsule 3  . fentaNYL (DURAGESIC - DOSED MCG/HR) 25 MCG/HR patch Place 1 patch (25 mcg total) onto the skin every other day. Use with 152mg and 726m patch for total dose of 20063m 12 patch 0  . HYDROmorphone (DILAUDID) 2 MG tablet Take 1 tablet (2 mg total) by mouth every 4 (four) hours as needed for severe pain. 60 tablet 0   No current facility-administered medications for this visit.    OBJECTIVE:  Filed Vitals:   11/22/14 1507  BP: 129/77  Pulse: 105  Temp: 98.2 F (36.8 C)     Body mass index is 23.65 kg/(m^2).    ECOG FS:2 - Symptomatic, <50% confined to bed  Review of system Gen. status: Performance status is declining. Increasing pain and discomfort  HEENT: Increasing swelling on the left side of the neck Increasing drainage Increasing swelling of the left side of the face  Severe pain throbbing  Swelling of left upper extremity Lungs: Increasing shortness of breath cough Cardiac: No chest pain.  No shortness of breath.  GI: Poor appetite and losing weight.  GU: No dysuria or hematuria.  Skin: No swelling.  No rash.  No ecchymoses.  Lower extremities no swelling.Neurological system: No dizziness.  No tingling.  His was.  no tingling numbness.  no focal weakness or any focal signs.\ Overall declining performance status  LAB RESULTS:  Infusion on 11/22/2014  Component Date Value Ref Range Status  . WBC 11/22/2014 10.7* 3.8 - 10.6 K/uL Final  . RBC 11/22/2014 3.37* 4.40 - 5.90 MIL/uL Final  . Hemoglobin 11/22/2014 10.9* 13.0 - 18.0 g/dL Final  . HCT 11/22/2014 33.8* 40.0 - 52.0 % Final  . MCV 11/22/2014 100.3* 80.0 - 100.0 fL Final  . MCH 11/22/2014 32.2  26.0 - 34.0 pg Final  . MCHC 11/22/2014 32.1  32.0 - 36.0 g/dL Final  . RDW 11/22/2014 21.6* 11.5 - 14.5 % Final  . Platelets 11/22/2014 279  150 - 440 K/uL Final  . Neutrophils Relative % 11/22/2014 91   Final  . Lymphocytes Relative  11/22/2014 4   Final  .  Monocytes Relative 11/22/2014 5   Final  . Eosinophils Relative 11/22/2014 0   Final  . Basophils Relative 11/22/2014 0   Final  . Smear Review 11/22/2014 NRBCS SEEN ON SMEAR SCAN   Corrected  . Neutro Abs 11/22/2014 9.8* 1.4 - 6.5 K/uL Final  . Lymphs Abs 11/22/2014 0.4* 1.0 - 3.6 K/uL Final  . Monocytes Absolute 11/22/2014 0.5  0.2 - 1.0 K/uL Final  . Eosinophils Absolute 11/22/2014 0.0  0 - 0.7 K/uL Final  . Basophils Absolute 11/22/2014 0.0  0 - 0.1 K/uL Final  . Sodium 11/22/2014 130* 135 - 145 mmol/L Final  . Potassium 11/22/2014 3.5  3.5 - 5.1 mmol/L Final  . Chloride 11/22/2014 94* 101 - 111 mmol/L Final  . CO2 11/22/2014 30  22 - 32 mmol/L Final  . Glucose, Bld 11/22/2014 95  65 - 99 mg/dL Final  . BUN 11/22/2014 10  6 - 20 mg/dL Final  . Creatinine, Ser 11/22/2014 0.71  0.61 - 1.24 mg/dL Final  . Calcium 11/22/2014 7.7* 8.9 - 10.3 mg/dL Final  . Total Protein 11/22/2014 5.4* 6.5 - 8.1 g/dL Final  . Albumin 11/22/2014 2.4* 3.5 - 5.0 g/dL Final  . AST 11/22/2014 17  15 - 41 U/L Final  . ALT 11/22/2014 71* 17 - 63 U/L Final  . Alkaline Phosphatase 11/22/2014 165* 38 - 126 U/L Final  . Total Bilirubin 11/22/2014 0.5  0.3 - 1.2 mg/dL Final  . GFR calc non Af Amer 11/22/2014 >60  >60 mL/min Final  . GFR calc Af Amer 11/22/2014 >60  >60 mL/min Final   Comment: (NOTE) The eGFR has been calculated using the CKD EPI equation. This calculation has not been validated in all clinical situations. eGFR's persistently <60 mL/min signify possible Chronic Kidney Disease.   . Anion gap 11/22/2014 6  5 - 15 Final     ASSESSMENT: 49 year old gentleman with stage IV and recurrent and progressive carcinoma of tongue Progressing disease started on gemcitabine and now been Increasing pain progressing disease        MEDICAL DECISION MAKING:  All lab data has been evaluated.  Clinical examination shows progressing disease.  Discussed this finding with the  patient and family Intravenous Toradol and steroid was given Intravenous Dilaudid was given Increase Decadron to 12 mg daily Increase fentanyl patch 200 g Discontinue oxycodone Start patient on Dilaudid Stop chemotherapy Palliative care options versus further chemotherapy with cis-platinum and 5-FU.  And or vectibix or cetuximab.    Primary cancer of tongue   Staging form: Lip and Oral Cavity, AJCC 7th Edition     Clinical: Stage IVC (T2, N2, M1) - Unsigned   [name], MD   11/22/2014 8:28 PM

## 2014-11-23 ENCOUNTER — Telehealth: Payer: Self-pay | Admitting: *Deleted

## 2014-11-23 NOTE — Telephone Encounter (Signed)
Called patient to inform him to hold lovenox until next appt.  Continue pain medication prescribed yesterday and call us if bleeding persists.  Spoke with patient's wife.  Verbalized understanding.

## 2014-11-23 NOTE — Telephone Encounter (Signed)
Hold lovenox until next appt. Call back if bleeding continues but continue with current pain medication prescribed yesterday per Dr. Oliva Bustard

## 2014-11-23 NOTE — Telephone Encounter (Signed)
Patient's wife, Magda Paganini called to report patient's wound bled heavily last night.  When she changed the dressing this morning she noted large amount of frank red blood.  Further states there is an area that looks like congealed blood about 2" long and 1/4 " wide that is attached.  Wants to know if patient needs to be seen or should he just continue on his medication prescribed yesterday.

## 2014-11-24 ENCOUNTER — Other Ambulatory Visit: Payer: Self-pay | Admitting: *Deleted

## 2014-11-24 ENCOUNTER — Inpatient Hospital Stay
Admission: AD | Admit: 2014-11-24 | Discharge: 2014-12-01 | DRG: 948 | Disposition: A | Discharge status: Home / Self Care | Payer: BC Managed Care – PPO | Source: Ambulatory Visit | Attending: Oncology | Admitting: Oncology

## 2014-11-24 ENCOUNTER — Telehealth: Payer: Self-pay | Admitting: *Deleted

## 2014-11-24 ENCOUNTER — Inpatient Hospital Stay: Payer: BC Managed Care – PPO

## 2014-11-24 ENCOUNTER — Encounter: Payer: Self-pay | Admitting: General Practice

## 2014-11-24 VITALS — BP 144/87 | HR 102 | Temp 99.1°F | Resp 18

## 2014-11-24 DIAGNOSIS — Z882 Allergy status to sulfonamides status: Secondary | ICD-10-CM

## 2014-11-24 DIAGNOSIS — R599 Enlarged lymph nodes, unspecified: Secondary | ICD-10-CM | POA: Diagnosis present

## 2014-11-24 DIAGNOSIS — Z923 Personal history of irradiation: Secondary | ICD-10-CM | POA: Diagnosis not present

## 2014-11-24 DIAGNOSIS — Z880 Allergy status to penicillin: Secondary | ICD-10-CM

## 2014-11-24 DIAGNOSIS — I82622 Acute embolism and thrombosis of deep veins of left upper extremity: Secondary | ICD-10-CM | POA: Diagnosis present

## 2014-11-24 DIAGNOSIS — Z86718 Personal history of other venous thrombosis and embolism: Secondary | ICD-10-CM | POA: Diagnosis not present

## 2014-11-24 DIAGNOSIS — Z79899 Other long term (current) drug therapy: Secondary | ICD-10-CM

## 2014-11-24 DIAGNOSIS — J3801 Paralysis of vocal cords and larynx, unilateral: Secondary | ICD-10-CM | POA: Diagnosis present

## 2014-11-24 DIAGNOSIS — R51 Headache: Secondary | ICD-10-CM | POA: Diagnosis present

## 2014-11-24 DIAGNOSIS — D500 Iron deficiency anemia secondary to blood loss (chronic): Secondary | ICD-10-CM | POA: Diagnosis not present

## 2014-11-24 DIAGNOSIS — R52 Pain, unspecified: Secondary | ICD-10-CM | POA: Insufficient documentation

## 2014-11-24 DIAGNOSIS — C021 Malignant neoplasm of border of tongue: Secondary | ICD-10-CM | POA: Diagnosis not present

## 2014-11-24 DIAGNOSIS — M7989 Other specified soft tissue disorders: Secondary | ICD-10-CM | POA: Diagnosis present

## 2014-11-24 DIAGNOSIS — R221 Localized swelling, mass and lump, neck: Secondary | ICD-10-CM | POA: Diagnosis not present

## 2014-11-24 DIAGNOSIS — Z7901 Long term (current) use of anticoagulants: Secondary | ICD-10-CM | POA: Diagnosis not present

## 2014-11-24 DIAGNOSIS — E871 Hypo-osmolality and hyponatremia: Secondary | ICD-10-CM | POA: Diagnosis present

## 2014-11-24 DIAGNOSIS — C760 Malignant neoplasm of head, face and neck: Secondary | ICD-10-CM | POA: Diagnosis present

## 2014-11-24 DIAGNOSIS — Z021 Encounter for pre-employment examination: Secondary | ICD-10-CM | POA: Diagnosis not present

## 2014-11-24 DIAGNOSIS — C770 Secondary and unspecified malignant neoplasm of lymph nodes of head, face and neck: Secondary | ICD-10-CM | POA: Diagnosis not present

## 2014-11-24 DIAGNOSIS — A499 Bacterial infection, unspecified: Secondary | ICD-10-CM | POA: Diagnosis not present

## 2014-11-24 DIAGNOSIS — R29898 Other symptoms and signs involving the musculoskeletal system: Secondary | ICD-10-CM

## 2014-11-24 DIAGNOSIS — Z79891 Long term (current) use of opiate analgesic: Secondary | ICD-10-CM

## 2014-11-24 DIAGNOSIS — R630 Anorexia: Secondary | ICD-10-CM | POA: Diagnosis not present

## 2014-11-24 DIAGNOSIS — Z809 Family history of malignant neoplasm, unspecified: Secondary | ICD-10-CM

## 2014-11-24 DIAGNOSIS — G893 Neoplasm related pain (acute) (chronic): Secondary | ICD-10-CM | POA: Diagnosis present

## 2014-11-24 DIAGNOSIS — D869 Sarcoidosis, unspecified: Secondary | ICD-10-CM | POA: Diagnosis present

## 2014-11-24 DIAGNOSIS — I890 Lymphedema, not elsewhere classified: Secondary | ICD-10-CM | POA: Diagnosis present

## 2014-11-24 DIAGNOSIS — Z792 Long term (current) use of antibiotics: Secondary | ICD-10-CM | POA: Diagnosis not present

## 2014-11-24 DIAGNOSIS — D62 Acute posthemorrhagic anemia: Secondary | ICD-10-CM | POA: Diagnosis present

## 2014-11-24 DIAGNOSIS — R220 Localized swelling, mass and lump, head: Secondary | ICD-10-CM | POA: Diagnosis not present

## 2014-11-24 DIAGNOSIS — R0902 Hypoxemia: Secondary | ICD-10-CM | POA: Diagnosis present

## 2014-11-24 DIAGNOSIS — L03221 Cellulitis of neck: Secondary | ICD-10-CM | POA: Diagnosis present

## 2014-11-24 DIAGNOSIS — R0602 Shortness of breath: Secondary | ICD-10-CM | POA: Diagnosis not present

## 2014-11-24 DIAGNOSIS — C7951 Secondary malignant neoplasm of bone: Secondary | ICD-10-CM | POA: Diagnosis not present

## 2014-11-24 DIAGNOSIS — T814XXA Infection following a procedure, initial encounter: Secondary | ICD-10-CM | POA: Diagnosis present

## 2014-11-24 DIAGNOSIS — C029 Malignant neoplasm of tongue, unspecified: Secondary | ICD-10-CM | POA: Diagnosis present

## 2014-11-24 DIAGNOSIS — R58 Hemorrhage, not elsewhere classified: Secondary | ICD-10-CM | POA: Diagnosis not present

## 2014-11-24 DIAGNOSIS — R531 Weakness: Secondary | ICD-10-CM | POA: Diagnosis not present

## 2014-11-24 DIAGNOSIS — R634 Abnormal weight loss: Secondary | ICD-10-CM | POA: Diagnosis not present

## 2014-11-24 DIAGNOSIS — Z9221 Personal history of antineoplastic chemotherapy: Secondary | ICD-10-CM | POA: Diagnosis not present

## 2014-11-24 LAB — MRSA PCR SCREENING: MRSA BY PCR: NEGATIVE

## 2014-11-24 MED ORDER — FENTANYL 50 MCG/HR TD PT72: 200.0000 ug | MEDICATED_PATCH | TRANSDERMAL | Status: DC

## 2014-11-24 MED ORDER — HYDROMORPHONE HCL 1 MG/ML IJ SOLN
2.0000 mg | INTRAMUSCULAR | Status: DC | PRN
  Administered 2014-11-24 – 2014-11-26 (×12): 2 mg via INTRAVENOUS
  Filled 2014-11-24 (×12): qty 2

## 2014-11-24 MED ORDER — SODIUM CHLORIDE 0.9 % IV SOLN
16.0000 mg | Freq: Every day | INTRAVENOUS | Status: DC
  Administered 2014-11-24 – 2014-11-28 (×5): 16 mg via INTRAVENOUS
  Filled 2014-11-24 (×7): qty 1.6

## 2014-11-24 MED ORDER — HYDROMORPHONE HCL 1 MG/ML IJ SOLN
1.0000 mg | Freq: Once | INTRAMUSCULAR | Status: AC
  Administered 2014-11-24: 1 mg via INTRAVENOUS
  Filled 2014-11-24: qty 1

## 2014-11-24 MED ORDER — ACETAMINOPHEN 325 MG PO TABS: 650.0000 mg | ORAL_TABLET | ORAL | Status: DC | PRN

## 2014-11-24 MED ORDER — KETOROLAC TROMETHAMINE 15 MG/ML IJ SOLN
15.0000 mg | Freq: Two times a day (BID) | INTRAMUSCULAR | Status: DC
  Administered 2014-11-24 – 2014-11-28 (×9): 15 mg via INTRAVENOUS
  Filled 2014-11-24 (×10): qty 1

## 2014-11-24 MED ORDER — SODIUM CHLORIDE 0.9 % IV SOLN
INTRAVENOUS | Status: DC
Start: 2014-11-24 — End: 2014-11-27
  Administered 2014-11-24: 100 mL/h via INTRAVENOUS
  Administered 2014-11-26 – 2014-11-27 (×2): via INTRAVENOUS

## 2014-11-24 MED ORDER — LEVOFLOXACIN IN D5W 500 MG/100ML IV SOLN
500.0000 mg | INTRAVENOUS | Status: DC
  Administered 2014-11-24 – 2014-11-29 (×7): 500 mg via INTRAVENOUS
  Filled 2014-11-24 (×7): qty 100

## 2014-11-24 MED ORDER — HYDROMORPHONE HCL 1 MG/ML IJ SOLN
4.0000 mg | Freq: Once | INTRAMUSCULAR | Status: AC
  Administered 2014-11-24: 4 mg via INTRAVENOUS
  Filled 2014-11-24: qty 4

## 2014-11-24 MED ORDER — KETOROLAC TROMETHAMINE 15 MG/ML IJ SOLN
30.0000 mg | Freq: Once | INTRAMUSCULAR | Status: AC
  Administered 2014-11-24: 30 mg via INTRAVENOUS
  Filled 2014-11-24: qty 2

## 2014-11-24 MED ORDER — HYDROMORPHONE HCL 2 MG/ML IJ SOLN: 4.0000 mg | Freq: Once | INTRAMUSCULAR | Status: DC

## 2014-11-24 MED ORDER — VANCOMYCIN HCL 10 G IV SOLR
1500.0000 mg | Freq: Two times a day (BID) | INTRAVENOUS | Status: DC
  Administered 2014-11-24 – 2014-11-29 (×11): 1500 mg via INTRAVENOUS
  Filled 2014-11-24 (×14): qty 1500

## 2014-11-24 MED ORDER — PREGABALIN 75 MG PO CAPS
100.0000 mg | ORAL_CAPSULE | Freq: Two times a day (BID) | ORAL | Status: DC
  Administered 2014-11-24 – 2014-11-25 (×3): 100 mg via ORAL
  Filled 2014-11-24 (×3): qty 1

## 2014-11-24 MED ORDER — VANCOMYCIN HCL IN DEXTROSE 1-5 GM/200ML-% IV SOLN
1000.0000 mg | Freq: Once | INTRAVENOUS | Status: DC
  Filled 2014-11-24: qty 200

## 2014-11-24 NOTE — Progress Notes (Signed)
ANTIBIOTIC CONSULT NOTE - INITIAL  Pharmacy Consult for Vancomycin Indication: Cellulitis  Allergies  Allergen Reactions  . Azithromycin Other (See Comments)    Pt states he gets real flushed red and sweaty.  . Penicillins Hives    Childhood reaction reported to patient by mother.  . Sulfa Antibiotics     Childhood reactions  . Erythromycin Base Swelling    Patient Measurements:   Adjusted Body Weight: n/a  Vital Signs: Temp: 99.1 F (37.3 C) (07/13 1516) Temp Source: Tympanic (07/13 1516) BP: 100/63 mmHg (07/13 1900) Pulse Rate: 95 (07/13 1900) Intake/Output from previous day:   Intake/Output from this shift:    Labs:  Recent Labs  11/22/14 1345  WBC 10.7*  HGB 10.9*  PLT 279  CREATININE 0.71   Estimated Creatinine Clearance: 127.6 mL/min (by C-G formula based on Cr of 0.71). No results for input(s): VANCOTROUGH, VANCOPEAK, VANCORANDOM, GENTTROUGH, GENTPEAK, GENTRANDOM, TOBRATROUGH, TOBRAPEAK, TOBRARND, AMIKACINPEAK, AMIKACINTROU, AMIKACIN in the last 72 hours.   Microbiology: No results found for this or any previous visit (from the past 720 hour(s)).  Medical History: Past Medical History  Diagnosis Date  . Allergy   . Cancer     tongue and neck  . DVT (deep venous thrombosis)     left arm and left leg    Medications:  Prescriptions prior to admission  Medication Sig Dispense Refill Last Dose  . azelastine (ASTELIN) 0.1 % nasal spray Place 1 spray into the nose 2 (two) times daily as needed. For allergies   Taking  . calcium-vitamin D (OSCAL WITH D) 500-200 MG-UNIT per tablet Take 1 tablet by mouth 2 (two) times daily. 60 tablet 3 Taking  . chlorhexidine (PERIDEX) 0.12 % solution Use as directed 15 mLs in the mouth or throat 2 (two) times daily.   Taking  . citalopram (CELEXA) 20 MG tablet Take 1 tablet (20 mg total) by mouth at bedtime. 30 tablet 2 Taking  . dexamethasone (DECADRON) 4 MG tablet Take 2 tablets (8 mg total) by mouth daily. 30 tablet 0  Taking  . docusate sodium (COLACE) 100 MG capsule Take 100 mg by mouth daily as needed. for constipation.   Taking  . doxycycline (VIBRA-TABS) 100 MG tablet Take 100 mg by mouth 2 (two) times daily.   Taking  . enoxaparin (LOVENOX) 80 MG/0.8ML injection Inject 80 mg into the skin 2 (two) times daily.   Taking  . fentaNYL (DURAGESIC - DOSED MCG/HR) 100 MCG/HR Place 1 patch (100 mcg total) onto the skin every other day. 15 patch 0 Taking  . fentaNYL (DURAGESIC - DOSED MCG/HR) 25 MCG/HR patch Place 1 patch (25 mcg total) onto the skin every other day. Use with 139mcg and 12mcg patch for total dose of 264mcg. 12 patch 0   . fentaNYL (DURAGESIC - DOSED MCG/HR) 75 MCG/HR Place 1 patch (75 mcg total) onto the skin every other day. 15 patch 0 Taking  . fluticasone (FLONASE) 50 MCG/ACT nasal spray Place 2 sprays into the nose daily as needed. for allergies   Taking  . HYDROmorphone (DILAUDID) 2 MG tablet Take 1 tablet (2 mg total) by mouth every 4 (four) hours as needed for severe pain. 60 tablet 0   . levofloxacin (LEVAQUIN) 500 MG tablet Take 1 tablet (500 mg total) by mouth daily. 7 tablet 0   . omeprazole (PRILOSEC OTC) 20 MG tablet Take 20 mg by mouth every morning.   Taking  . oxyCODONE (ROXICODONE) 15 MG immediate release tablet Take 1-2  tablets (15-30 mg total) by mouth every 6 (six) hours as needed for pain. 120 tablet 0 Taking  . pregabalin (LYRICA) 75 MG capsule Take 1 capsule (75 mg total) by mouth 2 (two) times daily. 60 capsule 3 Taking   Assessment: Patient with progressing carcinoma base of tongue. Failing multiple chemotherapy program and radiation and surgical program in the past with intractable pain not controlled with oral pain medication. Cellulitis of neck  Goal of Therapy:  Vancomycin trough level 10-15 mcg/ml  Plan:  Measure antibiotic drug levels at steady state Follow up culture results Will order Vancomycin 1500mg  IV q12h to start 7hr after initial 1g dose for stacked  dosing. Will check a trough level prior to 5th dose.  Paulina Fusi, PharmD, BCPS 11/24/2014 7:51 PM

## 2014-11-24 NOTE — Progress Notes (Signed)
Patient settled by this RN, and Pam RN charge nurse. A&Ox4. Rates pain to left chest and neck 2/10 on pain scale and declines further intervention. NSR/ST per cardiac monitor. Report given to [facility] who is now taking over patient's care. [place] RN assisted with dressing change to left neck and chest and will document on neck and chest wounds. Wife at bedside at this time.  Lorita Forinash B

## 2014-11-24 NOTE — H&P (Signed)
Progress Notes   Corey Sims (MR# [id number])      Progress Notes Info    Author Note Status Last Update User Last Update Date/Time   [name], MD Signed [name], MD 11/12/2014 8:50 AM    Progress Notes    Expand All Collapse All    [place] @ [facility] Telephone:([phone]) [phone] Fax:([phone]) [place] OB: [birth year] MR#: [id number] MBT#:[id number]  Patient Care Team: Crecencio Mc, MD as PCP - General (Internal Medicine) Algernon Huxley, MD as Consulting Physician (Vascular Surgery)  CHIEF COMPLAINT:  Patient was admitted in the hospital from [place] [facility] with progressive headache and neck cancer.  Intractable pain which could not be controlled with oral pain medication and required intravenous pain medication.  Bleeding from the head and neck area.           Oncology History   62. 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 Margins were clear but close at 5 mm. Depth of invasion was 0.9 cm. There was no lymph vascular spread there was positive perineural spread. 22 nodes were at excised all negative for metastatic disease). 2. Mediastinal/hilar Adenopathy on PET scan - EBUS procedure and biopsy at [facility] on 09/02/2011 negative for malignancy. Also RLL lung biopsy at [facility] on 08/30/2011 negative for malignancy. 3. Patient had lower left cervical lymph node removed. (Surgery was done at [facility]) followed by introperative radiation therapy.(May of 2014) 4 Ebus in May of 2014 insufficient tissue 5. 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 6. Suspected Recurrent disease in the neck patient was started on cis-platinum, cetuximab and radiation therapy July17, 2014 7. Deep Vein thromboses in the left lower extremity on  Xaralto 8. Finished radiation and chemotherapy December 13, 2012 9. Abnormal CT scan and a PET scan. 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 Patient started on 5-FU , Cis-platinum, docetaxel from February 11, 2013 has finished total 5 cycles of chemotherapy. 10. Progressing disease. Started on palliative radiation treatment with cetuximab. (February, 201 5) 11. Finished radiation then cetuximab in April of 2015. 12. On carboplatinum and Taxol and cetuximab 13 Started on maintenance cetuimab from January 21, 2014 14.progressive disease so started on keytruda from March 15, 2014 15.ecent episode of thrombosis of left subclavian, left axillary veinpatient underwent thrombolytic therapy. Removal of port. (Because of infection)and started on Lovenox. 16.progressive disease on KEYTRUDA. Palliative radiation therapy 17.palliative radiation therapy,march 201 18. On IV methotrexate starting from April of 2016 19 progressive disease on methotrexate so patient was started on gemcitabine and vinorelbine from June of 2016     Primary cancer of tongue   08/26/2012 Initial Diagnosis Primary cancer of tongue    Oncology Flowsheet 10/04/2014 10/12/2014 10/15/2014 10/25/2014 10/27/2014 11/01/2014 11/08/2014  Day, Cycle Day 1, Cycle 1 Day 1, Cycle 2 - - - Day 1, Cycle 1 Day 8, Cycle 1  dexamethasone (DECADRON) IV [ 10 mg ] [ 10 mg ] - [ 10 mg ] - [ 20 mg ] [ 20 mg ]  dexamethasone (DECADRON) PO - - - - - - -  enoxaparin (LOVENOX) Powers - - - - 1 mg/kg - -  gemcitabine (GEMZAR) IV - - - - - 800 mg/m2 800 mg/m2  methotrexate (PF) IV 65 mg  65 mg - 65 mg - - -  methylPREDNISolone sodium succinate 125 mg/2 mL (SOLU-MEDROL) IM - - 60 mg - - - -  ondansetron (ZOFRAN) IV [ 8 mg ] [ 8 mg ] - [ 8 mg ] - [ 16 mg ] [ 16 mg ]  vinorelbine (NAVELBINE) IV - - - - - 20 mg/m2 20 mg/m2    INTERVAL  HISTORY: 49 year old gentleman with carcinoma of head and neck stage IV disease. Does not have any pain in lower extremity. Had a bone scan done which has been reviewed independently. Shows increase uptake in unknown metastatic site in clavicle. And sternum. But no evidence of metastases to the spine. Pain in the left upper chest for diarrhea continues. Here for further follow-up and treatment consideration with methotrexate. 49 year old gentleman with recurrent head and neck cancer. He has increasing pain in the left upper chest wall area started today. With more drainage. Patient is on methotrexate no soreness in the mouth November 01, 2014 Patient is here for ongoing evaluation and consideration of chemotherapy vinorelbine and gemcitabine. continues to have increasing discomfort and pain. Increasing swelling in the neck. Drainage. Increased November 08, 2014 Patient is here for ongoing evaluation and treatment consideration. Left-sided neck mass is stable. Pain continues to bother patient. Patient has started new chemotherapy with gemcitabine and never been. He is constant going in the left side of the neck and the left upper extremity. She has noticed some more swelling of the left upper extremity. Patient also is on Lovenox. November 24, 2014 Patient's condition has been rapidly declining.  Increasing swelling.  Intractable pain which in spite of filed giving intravenous Toradol and Dilaudid could not be controlled.  Fentanyl has been increased to 200 g.  In view of this patient was admitted in the hospital for control of pain possibility of underlying cellulitis As per HPI. Otherwise, a complete review of systems is negatve.  PAST MEDICAL HISTORY: Past Medical History  Diagnosis Date  . Allergy   . Cancer     tongue and neck  . DVT (deep venous thrombosis)     left arm and left leg    PAST SURGICAL HISTORY: Past Surgical History  Procedure Laterality Date   . Excision of tongue lesion with laser  08/13/11  . Neck dissection Bilateral 08/13/11  . Thoracotomy    . Peripheral vascular catheterization  10/27/2014    Procedure: PICC Line Insertion; Surgeon: Katha Cabal, MD; Location: [place] [facility]; Service: Cardiovascular;;    FAMILY HISTORY Family History  Problem Relation Age of Onset  . Cancer Father 77    prostate        ADVANCED DIRECTIVES: Advance care directive:  I had prolonged discussion with patient and wife regarding overall poor prognosis.  According to wife conversation is going on for a while about healthcare directive but patient did not want to discuss that. Today also he wanted to get his pain under control.  And did not want to discuss about code situation  HEALTH MAINTENANCE: History  Substance Use Topics  . Smoking status: Never Smoker   . Smokeless tobacco: Not on file  . Alcohol Use: No     Comment: very limited      Allergies  Allergen Reactions  . Azithromycin Other (See Comments)    Pt states he gets real flushed red and sweaty.  . Penicillins Hives    Childhood reaction reported to patient by mother.  . Sulfa Antibiotics  Childhood reactions  . Erythromycin Base Swelling    Current Outpatient Prescriptions  Medication Sig Dispense Refill  . azelastine (ASTELIN) 0.1 % nasal spray Place 1 spray into the nose 2 (two) times daily as needed. For allergies    . calcium-vitamin D (OSCAL WITH D) 500-200 MG-UNIT per tablet Take 1 tablet by mouth 2 (two) times daily. 60 tablet 3  . chlorhexidine (PERIDEX) 0.12 % solution Use as directed 15 mLs in the mouth or throat 2 (two) times daily.    . citalopram (CELEXA) 20 MG tablet Take 1 tablet (20 mg total) by mouth at bedtime. 30 tablet 2  . dexamethasone (DECADRON) 4 MG tablet Take 2 tablets (8 mg total) by mouth daily. 30  tablet 0  . docusate sodium (COLACE) 100 MG capsule Take 100 mg by mouth daily as needed. for constipation.    . doxycycline (VIBRA-TABS) 100 MG tablet Take 100 mg by mouth 2 (two) times daily.    . enoxaparin (LOVENOX) 80 MG/0.8ML injection Inject 80 mg into the skin 2 (two) times daily.    . [START ON 11/15/2014] fentaNYL (DURAGESIC - DOSED MCG/HR) 100 MCG/HR Place 1 patch (100 mcg total) onto the skin every other day. 15 patch 0  . [START ON 11/15/2014] fentaNYL (DURAGESIC - DOSED MCG/HR) 75 MCG/HR Place 1 patch (75 mcg total) onto the skin every other day. 15 patch 0  . fluticasone (FLONASE) 50 MCG/ACT nasal spray Place 2 sprays into the nose daily as needed. for allergies    . levofloxacin (LEVAQUIN) 500 MG tablet Take 1 tablet (500 mg total) by mouth daily. 10 tablet 0  . omeprazole (PRILOSEC OTC) 20 MG tablet Take 20 mg by mouth every morning.    . oxyCODONE (ROXICODONE) 15 MG immediate release tablet Take 1-2 tablets (15-30 mg total) by mouth every 6 (six) hours as needed for pain. 120 tablet 0  . pregabalin (LYRICA) 75 MG capsule Take 1 capsule (75 mg total) by mouth 2 (two) times daily. 60 capsule 3   No current facility-administered medications for this visit.    OBJECTIVE:  Filed Vitals:   11/08/14 1400  BP: 133/94  Pulse: 74  Temp: 97.5 F (36.4 C)   Body mass index is 23.07 kg/(m^2). ECOG FS:2 - Symptomatic, <50% confined to bed  Review of system Gen. status: Performance status is declining. HEENT: Increasing swelling on the left side of the neck Increasing drainage, bleeding. Swelling of left upper extremity Lungs: Increasing shortness of breath cough Cardiac: No chest pain. No shortness of breath. GI: Poor appetite and losing weight. GU: No dysuria or hematuria. Skin: No swelling. No rash. No ecchymoses. Lower extremities no swelling.Neurological system: No dizziness. No tingling. His was. no  tingling numbness. no focal weakness or any focal signs.\ Overall declining performance status Declining performance status.  Increasing difficulty swallowing.  Increasing difficulty coughing up sputum.  Hoarseness of voice.  Poor appetite  LAB RESULTS:  Office Visit on 11/08/2014  Component Date Value Ref Range Status  . WBC 11/08/2014 6.2  3.8 - 10.6 K/uL Final  . RBC 11/08/2014 3.85* 4.40 - 5.90 MIL/uL Final  . Hemoglobin 11/08/2014 12.3* 13.0 - 18.0 g/dL Final  . HCT 11/08/2014 37.9* 40.0 - 52.0 % Final  . MCV 11/08/2014 98.6  80.0 - 100.0 fL Final  . MCH 11/08/2014 32.0  26.0 - 34.0 pg Final  . MCHC 11/08/2014 32.4  32.0 - 36.0 g/dL Final  . RDW 11/08/2014 22.0* 11.5 - 14.5 % Final  .   Platelets 11/08/2014 103* 150 - 440 K/uL Final  . Neutrophils Relative % 11/08/2014 96   Final  . Neutro Abs 11/08/2014 5.9  1.4 - 6.5 K/uL Final  . Lymphocytes Relative 11/08/2014 2   Final  . Lymphs Abs 11/08/2014 0.1* 1.0 - 3.6 K/uL Final  . Monocytes Relative 11/08/2014 2   Final  . Monocytes Absolute 11/08/2014 0.1* 0.2 - 1.0 K/uL Final  . Eosinophils Relative 11/08/2014 0   Final  . Eosinophils Absolute 11/08/2014 0.0  0 - 0.7 K/uL Final  . Basophils Relative 11/08/2014 0   Final  . Basophils Absolute 11/08/2014 0.0  0 - 0.1 K/uL Final  . Sodium 11/08/2014 128* 135 - 145 mmol/L Final  . Potassium 11/08/2014 3.8  3.5 - 5.1 mmol/L Final  . Chloride 11/08/2014 94* 101 - 111 mmol/L Final  . CO2 11/08/2014 28  22 - 32 mmol/L Final  . Glucose, Bld 11/08/2014 209* 65 - 99 mg/dL Final  . BUN 11/08/2014 15  6 - 20 mg/dL Final  . Creatinine, Ser 11/08/2014 0.70  0.61 - 1.24 mg/dL Final  . Calcium 11/08/2014 8.3* 8.9 - 10.3 mg/dL Final  . Total Protein 11/08/2014 5.9* 6.5 - 8.1 g/dL Final  . Albumin 11/08/2014 2.6* 3.5 -  5.0 g/dL Final  . AST 11/08/2014 35  15 - 41 U/L Final  . ALT 11/08/2014 145* 17 - 63 U/L Final  . Alkaline Phosphatase 11/08/2014 133* 38 - 126 U/L Final  . Total Bilirubin 11/08/2014 0.5  0.3 - 1.2 mg/dL Final  . GFR calc non Af Amer 11/08/2014 >60  >60 mL/min Final  . GFR calc Af Amer 11/08/2014 >60  >60 mL/min Final   Comment: (NOTE) The eGFR has been calculated using the CKD EPI equation. This calculation has not been validated in all clinical situations. eGFR's persistently <60 mL/min signify possible Chronic Kidney Disease.   . Anion gap 11/08/2014 6  5 - 15 Final     STUDIES:  Imaging Results    No results found.    ASSESSMENT: 49 year old gentleman with progressing carcinoma base of tongue.  Failing multiple chemotherapy program and radiation and surgical program in the past with intractable pain not controlled with oral pain medication. Cellulitis of neck        MEDICAL DECISION MAKING:  1.  Draw pain with IV Toradol IV Decadron liquid week fentanyl patch 2.  Treat treatable condition like cellulitis with vancomycin and Levaquin Blood culture Review lab and see if transfusion can help Overall prognosis is extremely poor and conversation has been going on for a while regarding healthcare directive. We will also get palliative care consult as well as primary doctor consult to manage medical problems ENT evaluation also would be planned Goal right now is to control pain At request of oncology floor patient is now being admitted in the stepdown unit in the intensive care unit   Primary cancer of tongue  Staging form: Lip and Oral Cavity, AJCC 7th Edition  Clinical: Stage IVC (T2, N2, M1) - Marni Griffon, MD 11/12/2014 8:46 AM

## 2014-11-24 NOTE — Telephone Encounter (Signed)
Patient called to ask if he can come in for IVF today and get IV dilaudid.  Patient states his pain is unresolved and the po dilaudid is not covering. He has taken 2 doses this morning. States his dressing was not as bloody as yesterday

## 2014-11-24 NOTE — Telephone Encounter (Signed)
Called patient to inform him that he can come in for IV pain medication.  Per Maudie Mercury in chemo, patient can come now.  Instructed him to come asap but before 3:30. Verbalized understanding.

## 2014-11-24 NOTE — Progress Notes (Unsigned)
Report given to Ethel, RN in CCU. Pt transported to room CCU13 via wheelchair.

## 2014-11-24 NOTE — Telephone Encounter (Signed)
Ok to come this afternoon for IV pain meds. Okay per Maudie Mercury in chemo. Pt needs to come now.

## 2014-11-25 ENCOUNTER — Encounter: Payer: Self-pay | Admitting: Otolaryngology

## 2014-11-25 DIAGNOSIS — M7989 Other specified soft tissue disorders: Secondary | ICD-10-CM

## 2014-11-25 DIAGNOSIS — G893 Neoplasm related pain (acute) (chronic): Principal | ICD-10-CM

## 2014-11-25 DIAGNOSIS — Z86718 Personal history of other venous thrombosis and embolism: Secondary | ICD-10-CM

## 2014-11-25 DIAGNOSIS — Z79899 Other long term (current) drug therapy: Secondary | ICD-10-CM

## 2014-11-25 DIAGNOSIS — R221 Localized swelling, mass and lump, neck: Secondary | ICD-10-CM

## 2014-11-25 DIAGNOSIS — R220 Localized swelling, mass and lump, head: Secondary | ICD-10-CM

## 2014-11-25 DIAGNOSIS — R630 Anorexia: Secondary | ICD-10-CM

## 2014-11-25 DIAGNOSIS — C021 Malignant neoplasm of border of tongue: Secondary | ICD-10-CM

## 2014-11-25 DIAGNOSIS — R634 Abnormal weight loss: Secondary | ICD-10-CM

## 2014-11-25 DIAGNOSIS — R0602 Shortness of breath: Secondary | ICD-10-CM

## 2014-11-25 LAB — COMPREHENSIVE METABOLIC PANEL
ALT: 39 U/L (ref 17–63)
AST: 15 U/L (ref 15–41)
Albumin: 1.9 g/dL — ABNORMAL LOW (ref 3.5–5.0)
Alkaline Phosphatase: 120 U/L (ref 38–126)
Anion gap: 6 (ref 5–15)
BUN: 14 mg/dL (ref 6–20)
CALCIUM: 8.2 mg/dL — AB (ref 8.9–10.3)
CHLORIDE: 99 mmol/L — AB (ref 101–111)
CO2: 30 mmol/L (ref 22–32)
Creatinine, Ser: 0.62 mg/dL (ref 0.61–1.24)
GFR calc non Af Amer: >60 mL/min (ref >60)
GLUCOSE: 139 mg/dL — AB (ref 65–99)
POTASSIUM: 4.4 mmol/L (ref 3.5–5.1)
SODIUM: 135 mmol/L (ref 135–145)
Total Bilirubin: 0.6 mg/dL (ref 0.3–1.2)
Total Protein: 4.8 g/dL — ABNORMAL LOW (ref 6.5–8.1)

## 2014-11-25 LAB — CBC
HEMATOCRIT: 27.9 % — AB (ref 40.0–52.0)
HEMOGLOBIN: 9.2 g/dL — AB (ref 13.0–18.0)
MCH: 33.3 pg (ref 26.0–34.0)
MCHC: 33.1 g/dL (ref 32.0–36.0)
MCV: 100.8 fL — ABNORMAL HIGH (ref 80.0–100.0)
PLATELETS: 228 10*3/uL (ref 150–440)
RBC: 2.77 MIL/uL — AB (ref 4.40–5.90)
RDW: 21.7 % — ABNORMAL HIGH (ref 11.5–14.5)
WBC: 9.8 10*3/uL (ref 3.8–10.6)

## 2014-11-25 MED ORDER — FENTANYL 100 MCG/HR TD PT72
200.0000 ug | MEDICATED_PATCH | TRANSDERMAL | Status: DC
  Administered 2014-11-26: 200 ug via TRANSDERMAL
  Filled 2014-11-25: qty 4
  Filled 2014-11-25 (×2): qty 2

## 2014-11-25 MED ORDER — HYDROMORPHONE HCL 1 MG/ML IJ SOLN
3.0000 mg | INTRAMUSCULAR | Status: DC | PRN
  Administered 2014-11-25 – 2014-11-30 (×25): 3 mg via INTRAVENOUS
  Filled 2014-11-25 (×26): qty 3

## 2014-11-25 MED ORDER — HYDROMORPHONE HCL 2 MG PO TABS
4.0000 mg | ORAL_TABLET | Freq: Four times a day (QID) | ORAL | Status: DC
  Administered 2014-11-25 – 2014-11-26 (×8): 4 mg via ORAL
  Filled 2014-11-25 (×8): qty 2

## 2014-11-25 MED ORDER — POLYETHYLENE GLYCOL 3350 17 G PO PACK
17.0000 g | PACK | Freq: Every day | ORAL | Status: DC
  Administered 2014-11-25 – 2014-12-01 (×7): 17 g via ORAL
  Filled 2014-11-25 (×7): qty 1

## 2014-11-25 MED ORDER — SENNOSIDES-DOCUSATE SODIUM 8.6-50 MG PO TABS
2.0000 | ORAL_TABLET | Freq: Two times a day (BID) | ORAL | Status: DC
  Administered 2014-11-25 – 2014-12-01 (×13): 2 via ORAL
  Filled 2014-11-25 (×13): qty 2

## 2014-11-25 MED ORDER — KETOROLAC TROMETHAMINE 15 MG/ML IJ SOLN
15.0000 mg | Freq: Once | INTRAMUSCULAR | Status: AC
  Administered 2014-11-25: 15 mg via INTRAVENOUS

## 2014-11-25 NOTE — Progress Notes (Signed)
Patient SR on cardiac monitor, VSS, afebrile. RA, SATs within normal range, >90% acceptable. Patient has scheduled PO Dilaudid and increased IV Dilaudid to 3 mg q 1 hour PRN, per Dr. Oliva Bustard, after breakthough pain.

## 2014-11-25 NOTE — Progress Notes (Signed)
Tolerated diet, fair appetitie. Uses urinal for void, adequate UOP. Patient has 48 y.o. Daughter who visited at bedside today.  Tolerates dressing changes with pre-pain medication. Wife at bedside, updated by Dr. Pryor Ochoa on progression of disease, after discussion with MD, patient requested Ltd code status, no CPR, defibrillation, or intubation. Yes to IVF or vasopressors.

## 2014-11-25 NOTE — Progress Notes (Signed)
[place] @ [facility] [phone]  [phone] [place] OB: 10-19-1965  MR#: [id number]  IRW#:[id number]  Patient Care Team: Crecencio Mc, MD as PCP - General (Internal Medicine) Algernon Huxley, MD as Consulting Physician (Vascular Surgery)  CHIEF COMPLAINT:  No chief complaint on file.   Oncology History   46. 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 Margins were clear but close at 5 mm. Depth of invasion was 0.9 cm. There was no lymph vascular spread there was positive perineural spread. 22 nodes were at excised all negative for metastatic disease). 2. Mediastinal/hilar Adenopathy on PET scan - EBUS procedure and biopsy at [facility] on 09/02/2011 negative for malignancy.  Also RLL lung biopsy at [facility] on 08/30/2011 negative for malignancy. 3. Patient had lower left cervical lymph node removed.  (Surgery was done at [facility]) followed by introperative radiation therapy.(May of 2014) 4  Ebus in May of 2014 insufficient tissue 5. 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 6. Suspected Recurrent disease in the neck patient was started on cis-platinum, cetuximab and radiation therapy July17, 2014 7. Deep Vein  thromboses in the left lower extremity on Xaralto 8. Finished radiation and chemotherapy December 13, 2012 9. Abnormal CT scan and a PET scan.  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 Patient started on 5-FU , Cis-platinum, docetaxel from February 11, 2013 has finished total  5  cycles of chemotherapy. 10. Progressing disease.  Started on palliative radiation treatment with cetuximab.  (February, 201 5) 11. Finished radiation then cetuximab in April of 2015. 12. On carboplatinum and Taxol  and cetuximab 13 Started on  maintenance  cetuimab from January 21, 2014 14.progressive disease so started on  keytruda from March 15, 2014 15.ecent episode of thrombosis of left subclavian, left axillary veinpatient underwent thrombolytic therapy.  Removal of port.  (Because of infection)and started on Lovenox. 16.progressive disease on KEYTRUDA.  Palliative radiation therapy 17.palliative radiation therapy,march 201 18.  On IV methotrexate starting from April of 2016 19 progressive disease on methotrexate so patient was started on gemcitabine and vinorelbine from June of 2016     Primary cancer of tongue   08/26/2012 Initial Diagnosis Primary cancer of tongue    Oncology Flowsheet 10/27/2014 11/01/2014 11/08/2014 11/19/2014 11/22/2014 11/24/2014 11/25/2014  Day, Cycle - Day 1, Cycle 1 Day 8, Cycle 1 - - - -  dexamethasone (DECADRON) IV - [ 20 mg ] [ 20 mg ] 10 mg 16 mg 16 mg 16 mg  dexamethasone (DECADRON) PO - - - - - - -  enoxaparin (LOVENOX) [place] 1 mg/kg - - - - - -  gemcitabine (GEMZAR) IV - 800 mg/m2 800 mg/m2 - - - -  methotrexate (PF) IV - - - - - - -  methylPREDNISolone sodium succinate 125 mg/2 mL (SOLU-MEDROL) IM - - - - - - -  ondansetron (ZOFRAN) IV - [ 16 mg ] [ 16 mg ] - - - -  vinorelbine (NAVELBINE) IV - 20 mg/m2 20 mg/m2 - - - -    INTERVAL HISTORY: 49 year old gentleman with carcinoma of head and neck stage IV disease.  Does not have any pain in lower extremity.   Patient was admitted in hospital with severe intractable pain.  And  bleeding received number of pain medication including intravenous Dilaudid Toradol without significant delay.  So patient was admitted in the hospital last night patient felt better slept better I had detailed discussion with family as well as with Dr.vaught . According to Dr. Pryor Ochoa patient is agreeable to immediate code.  Pain is under better control.  Will start patient on oral Dilaudid with breakthrough pain medication   As per HPI. Otherwise, a complete review of  systems is negatve.  PAST MEDICAL HISTORY: Past Medical History  Diagnosis Date  . Allergy   . Cancer     tongue and neck  . DVT (deep venous thrombosis)     left arm and left leg    PAST SURGICAL HISTORY: Past Surgical History  Procedure Laterality Date  . Excision of tongue lesion with laser  08/13/11  . Neck dissection Bilateral 08/13/11  . Thoracotomy    . Peripheral vascular catheterization  10/27/2014    Procedure: PICC Line Insertion;  Surgeon: Katha Cabal, MD;  Location: [place] [facility];  Service: Cardiovascular;;    FAMILY HISTORY Family History  Problem Relation Age of Onset  . Cancer Father 11    prostate          ADVANCED DIRECTIVES: Advance care directive: Patient does have advanced healthcare directive and has been reviewed.  Patient does not want to change it at present time    HEALTH MAINTENANCE: History  Substance Use Topics  . Smoking status: Never Smoker   . Smokeless tobacco: Not on file  . Alcohol Use: No     Comment: very limited      Allergies  Allergen Reactions  . Azithromycin Other (See Comments)    Pt states he gets real flushed red and sweaty.  . Penicillins Hives    Childhood reaction reported to patient by mother.  . Sulfa Antibiotics     Childhood reactions  . Erythromycin Base Swelling    Current Facility-Administered Medications  Medication Dose Route Frequency Provider Last Rate Last Dose  . 0.9 %  sodium chloride infusion   Intravenous Continuous [name], MD 100 mL/hr at 11/25/14 0700    . acetaminophen (TYLENOL) tablet 650 mg  650 mg Oral Q4H PRN [name], MD      . dexamethasone (DECADRON) 16 mg in sodium chloride 0.9 % 50 mL IVPB  16 mg Intravenous Daily [name], MD   16 mg at 11/25/14 1304  . [START ON 11/26/2014] fentaNYL (DURAGESIC - dosed mcg/hr) 200 mcg  200 mcg Transdermal Q72H [name], MD      . HYDROmorphone (DILAUDID) injection 2 mg  2 mg Intravenous Q2H PRN [name], MD   2  mg at 11/25/14 1354  . HYDROmorphone (DILAUDID) tablet 4 mg  4 mg Oral QID [name], MD   4 mg at 11/25/14 1304  . ketorolac (TORADOL) 15 MG/ML injection 15 mg  15 mg Intravenous Q12H [name], MD   15 mg at 11/25/14 1117  . levofloxacin (LEVAQUIN) IVPB 500 mg  500 mg Intravenous Q24H [name], MD   500 mg at 11/24/14 1930  . polyethylene glycol (MIRALAX / GLYCOLAX) packet 17 g  17 g Oral Daily [name], MD   17 g at 11/25/14 1116  . pregabalin (LYRICA) capsule 100 mg  100 mg Oral BID [name], MD   100 mg at 11/25/14 1117  . senna-docusate (Senokot-S) tablet 2 tablet  2 tablet Oral BID [name], MD   2  tablet at 11/25/14 1117  . vancomycin (VANCOCIN) 1,500 mg in sodium chloride 0.9 % 500 mL IVPB  1,500 mg Intravenous Q12H [name], MD   1,500 mg at 11/25/14 1335    OBJECTIVE:  Filed Vitals:   11/25/14 1300  BP: 98/71  Pulse:   Temp:   Resp: 22     There is no weight on file to calculate BMI.    ECOG FS:2 - Symptomatic, <50% confined to bed  Review of system Gen. status: Performance status is declining. Increasing pain and discomfort  HEENT: Increasing swelling on the left side of the neck Increasing drainage Increasing swelling of the left side of the face  Severe pain throbbing  Swelling of left upper extremity Lungs: Increasing shortness of breath cough Cardiac: No chest pain.  No shortness of breath.  GI: Poor appetite and losing weight.  GU: No dysuria or hematuria.  Skin: No swelling.  No rash.  No ecchymoses.  Lower extremities no swelling.Neurological system: No dizziness.  No tingling.  His was.  no tingling numbness.  no focal weakness or any focal signs.\ Overall declining performance status  LAB RESULTS:  Admission on 11/24/2014  Component Date Value Ref Range Status  . WBC 11/25/2014 9.8  3.8 - 10.6 K/uL Final  . RBC 11/25/2014 2.77* 4.40 - 5.90 MIL/uL Final  . Hemoglobin 11/25/2014 9.2* 13.0 - 18.0 g/dL Final  . HCT 11/25/2014 27.9*  40.0 - 52.0 % Final  . MCV 11/25/2014 100.8* 80.0 - 100.0 fL Final  . MCH 11/25/2014 33.3  26.0 - 34.0 pg Final  . MCHC 11/25/2014 33.1  32.0 - 36.0 g/dL Final  . RDW 11/25/2014 21.7* 11.5 - 14.5 % Final  . Platelets 11/25/2014 228  150 - 440 K/uL Final  . Sodium 11/25/2014 135  135 - 145 mmol/L Final  . Potassium 11/25/2014 4.4  3.5 - 5.1 mmol/L Final  . Chloride 11/25/2014 99* 101 - 111 mmol/L Final  . CO2 11/25/2014 30  22 - 32 mmol/L Final  . Glucose, Bld 11/25/2014 139* 65 - 99 mg/dL Final  . BUN 11/25/2014 14  6 - 20 mg/dL Final  . Creatinine, Ser 11/25/2014 0.62  0.61 - 1.24 mg/dL Final  . Calcium 11/25/2014 8.2* 8.9 - 10.3 mg/dL Final  . Total Protein 11/25/2014 4.8* 6.5 - 8.1 g/dL Final  . Albumin 11/25/2014 1.9* 3.5 - 5.0 g/dL Final  . AST 11/25/2014 15  15 - 41 U/L Final  . ALT 11/25/2014 39  17 - 63 U/L Final  . Alkaline Phosphatase 11/25/2014 120  38 - 126 U/L Final  . Total Bilirubin 11/25/2014 0.6  0.3 - 1.2 mg/dL Final  . GFR calc non Af Amer 11/25/2014 >60  >60 mL/min Final  . GFR calc Af Amer 11/25/2014 >60  >60 mL/min Final   Comment: (NOTE) The eGFR has been calculated using the CKD EPI equation. This calculation has not been validated in all clinical situations. eGFR's persistently <60 mL/min signify possible Chronic Kidney Disease.   . Anion gap 11/25/2014 6  5 - 15 Final  . Specimen Description 11/24/2014 BLOOD RIGHT ASSIST CONTROL   Final  . Special Requests 11/24/2014 BOTTLES DRAWN AEROBIC AND ANAEROBIC 10ML   Final  . Culture 11/24/2014 NO GROWTH < 24 HOURS   Final  . Report Status 11/24/2014 PENDING   Incomplete  . MRSA by PCR 11/24/2014 NEGATIVE  NEGATIVE Final     ASSESSMENT: 49 year old gentleman with stage IV and recurrent and progressive carcinoma of  tongue Progressing disease started on gemcitabine and now been Increasing pain progressing disease   Add oral pain medication.  Continue antibiotic. ENT management. If  patient continues to  bleed vascular surgeon evaluation. Out of bed in chair  Continue Toradol, Decadron, intravenous Dilaudid and Dilaudid by mouth       Palliative care options versus further chemotherapy with cis-platinum and 5-FU.  And or vectibix or cetuximab.    Primary cancer of tongue   Staging form: Lip and Oral Cavity, AJCC 7th Edition     Clinical: Stage IVC (T2, N2, M1) - Unsigned   [name], MD   11/25/2014 2:01 PM

## 2014-11-25 NOTE — Consult Note (Signed)
Corey Sims, Corey Sims Sep 10, 1965 Corey Gleason, MD  Reason for Consult: pain and drainage from open wound of neck and chest  HPI: 49 year old male with history of recurrent metastatic SCCA of oral cavity.  Multiple treatments and procedures on left neck with history of open wounds that have waxed and waned.  Over the past several months has had increasing drainage from chest wound.  Has seen wound care.  Admitted for uncontrolled pain by Heme/Onc.  Significant amount of bleeding from wound yesterday was noted.  Allergies:  Allergies  Allergen Reactions  . Azithromycin Other (See Comments)    Pt states he gets real flushed red and sweaty.  . Penicillins Hives    Childhood reaction reported to patient by mother.  . Sulfa Antibiotics     Childhood reactions  . Erythromycin Base Swelling    ROS: Review of systems normal other than 12 systems except per HPI.  PMH:  Past Medical History  Diagnosis Date  . Allergy   . Cancer     tongue and neck  . DVT (deep venous thrombosis)     left arm and left leg    FH:  Family History  Problem Relation Age of Onset  . Cancer Father 18    prostate     SH:  History   Social History  . Marital Status: Married    Spouse Name: Corey Sims  . Number of Children: Corey Sims  . Years of Education: Corey Sims   Occupational History  . Not on file.   Social History Main Topics  . Smoking status: Never Smoker   . Smokeless tobacco: Not on file  . Alcohol Use: No     Comment: very limited  . Drug Use: No  . Sexual Activity: Not on file   Other Topics Concern  . Not on file   Social History Narrative    PSH:  Past Surgical History  Procedure Laterality Date  . Excision of tongue lesion with laser  08/13/11  . Neck dissection Bilateral 08/13/11  . Thoracotomy    . Peripheral vascular catheterization  10/27/2014    Procedure: PICC Line Insertion;  Surgeon: Katha Cabal, MD;  Location: [place] [facility];  Service: Cardiovascular;;     Physical  Exam:  GEN-  Breathy quality to voice due to left vocal cord paralysis, other CN 2-12 grossly intact and symmetric.  Mild distress due to pain. EAC/TMs normal BL.  Oral cavity:-  Xerostomia and s/p left partial glossectomy Neck- signficant radiation fibrosis of left neck with 3 open wounds with are connected with fibrotic exudate in base of top two open wounds.  Small bridge of tissue between the two.  Large open wound on his sternum with cavity extending into his mediatinum.  Approximately 75ccs of sersanguinous and foul smelling drainage carefully removed with suction.  I was not able to remove all fluid due to significant depth of the cavity.   A/P: Metastatic SCCA of left neck with large open wound of chest extending down into mediastinum with drainage and recent bleeding.  Plan:  Unfortunately options are limited at this time except for palliative care.  Discussed with Vascular Surgery as well as Thoracic surgery and options are to consider an angiogram if mild/moderate bleeding continues.  If large bleeding occurs from patient's mediastinum, it most likely will be fatal as I do not feel the patient would be able to survive.  Extensive discussion with patient and family and decision to make limited code  without CPR made.  Care needs to be taken with dressing the open wounds several times a day with wet to dry.  Continue wound care dressing changes.   [name] 11/25/2014 5:56 PM

## 2014-11-25 NOTE — Progress Notes (Signed)
ANTIBIOTIC CONSULT NOTE - FOLLOW UP  Pharmacy Consult for Vancomycin Dosing Indication: cellulitis  Allergies  Allergen Reactions  . Azithromycin Other (See Comments)    Pt states he gets real flushed red and sweaty.  . Penicillins Hives    Childhood reaction reported to patient by mother.  . Sulfa Antibiotics     Childhood reactions  . Erythromycin Base Swelling    Patient Measurements:   Vital Signs: Temp: 98 F (36.7 C) (07/14 0700) Temp Source: Oral (07/14 0700) BP: 98/71 mmHg (07/14 1300) Intake/Output from previous day: 07/13 0701 - 07/14 0700 In: -  Out: 350 [Urine:350] Intake/Output from this shift:    Labs:  Recent Labs  11/25/14 0428  WBC 9.8  HGB 9.2*  PLT 228  CREATININE 0.62   Estimated Creatinine Clearance: 127.6 mL/min (by C-G formula based on Cr of 0.62). No results for input(s): VANCOTROUGH, VANCOPEAK, VANCORANDOM, GENTTROUGH, GENTPEAK, GENTRANDOM, TOBRATROUGH, TOBRAPEAK, TOBRARND, AMIKACINPEAK, AMIKACINTROU, AMIKACIN in the last 72 hours.   Microbiology: Recent Results (from the past 720 hour(s))  MRSA PCR Screening     Status: None   Collection Time: 11/24/14  6:22 PM  Result Value Ref Range Status   MRSA by PCR NEGATIVE NEGATIVE Final  Culture, blood (routine x 2)     Status: None (Preliminary result)   Collection Time: 11/24/14  6:52 PM  Result Value Ref Range Status   Specimen Description BLOOD RIGHT ASSIST CONTROL  Final   Special Requests BOTTLES DRAWN AEROBIC AND ANAEROBIC 10ML  Final   Culture NO GROWTH < 24 HOURS  Final   Report Status PENDING  Incomplete    Anti-infectives    Start     Dose/Rate Route Frequency Ordered Stop   11/24/14 2200  vancomycin (VANCOCIN) 1,500 mg in sodium chloride 0.9 % 500 mL IVPB     1,500 mg 250 mL/hr over 120 Minutes Intravenous Every 12 hours 11/24/14 2128     11/24/14 1900  levofloxacin (LEVAQUIN) IVPB 500 mg     500 mg 100 mL/hr over 60 Minutes Intravenous Every 24 hours 11/24/14 1828     11/24/14 1830  vancomycin (VANCOCIN) IVPB 1000 mg/200 mL premix  Status:  Discontinued     1,000 mg 200 mL/hr over 60 Minutes Intravenous  Once 11/24/14 1828 11/24/14 2128       Assessment: Patient with progressing carcinoma base of tongue. Failing multiple chemotherapy program and radiation and surgical program in the past with intractable pain not controlled with oral pain medication. Patient being treated with vancomycin (day 2) and levofloxacin (day 2) for cellulitis of neck  Goal of Therapy:  Vancomycin trough level 10-15 mcg/ml  Plan:  Measure antibiotic drug levels at steady state Follow up culture results Will continue vancomycin 1500mg  IV q12hr for goal trough of 10-15. Will check a trough level prior to 5th dose.  Pharmacy will continue to monitor and adjust per consult.    Simpson,Michael L 11/25/2014,2:29 PM

## 2014-11-25 NOTE — Progress Notes (Signed)
Wound dressings changed again by this RN and Dr. Pryor Ochoa, and patient wife, who has brought his home dressing foam and tape for care. Dr. Pryor Ochoa has placed one wet saline gauze in each wound, with tail of gauze to be left out of wound. Dressing changes x2 daily, or PRN if bleeding occurs.

## 2014-11-25 NOTE — Consult Note (Signed)
[place] [facility] [place] NAME: Corey Sims    MR#:  [id number]  DATE OF BIRTH:  July 29, 1965  DATE OF ADMISSION:  11/24/2014  PRIMARY CARE PHYSICIAN: Corey Mc, MD   REQUESTING/REFERRING PHYSICIAN: [name], MD  CHIEF COMPLAINT:  No chief complaint on file.  reason for consult: Medical management.  HISTORY OF PRESENT ILLNESS:  Corey Sims  is a 49 y.o. 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 The patient was admitted to the hospital for direct admission due to intractable pain last night. Dr. Oliva Sims requested hospitalist consult for medical management. The patient has no complaints at this time. He had intractable pain around the neck and the chest due to cancer. He received Dilaudid, fentanyl patch and Toradol. He denies any fever or chills, denies any chest pain, palpitation or shortness of breath. According to RN, patient had neck cellulitis and bleeding from the neck. No acute bleeding at this time.  He has been treated with antibiotics. ENT physician is on board.  PAST MEDICAL HISTORY:   Past Medical History  Diagnosis Date  . Allergy   . Cancer     tongue and neck  . DVT (deep venous thrombosis)     left arm and left leg    PAST SURGICAL HISTOIRY:   Past Surgical History  Procedure Laterality Date  . Excision of tongue lesion with laser  08/13/11  . Neck dissection Bilateral 08/13/11  . Thoracotomy    . Peripheral vascular catheterization  10/27/2014    Procedure: PICC Line Insertion;  Surgeon: Corey Cabal, MD;  Location: [facility];  Service: Cardiovascular;;    SOCIAL HISTORY:   History  Substance Use Topics  . Smoking status: Never Smoker   . Smokeless tobacco: Not on file  . Alcohol Use: No     Comment: very limited    FAMILY HISTORY:   Family History   Problem Relation Age of Onset  . Cancer Father 10    prostate     DRUG ALLERGIES:   Allergies  Allergen Reactions  . Azithromycin Other (See Comments)    Pt states he gets real flushed red and sweaty.  . Penicillins Hives    Childhood reaction reported to patient by mother.  . Sulfa Antibiotics     Childhood reactions  . Erythromycin Base Swelling    REVIEW OF SYSTEMS:  CONSTITUTIONAL: No fever, fatigue or weakness.  EYES: No blurred or double vision.  EARS, NOSE, AND THROAT: No tinnitus or ear pain. No slurred speech or dysphagia. RESPIRATORY: No cough, shortness of breath, wheezing or hemoptysis.  CARDIOVASCULAR: No chest pain, orthopnea, edema.  GASTROINTESTINAL: No nausea, vomiting, diarrhea or abdominal pain.  GENITOURINARY: No dysuria, hematuria.  ENDOCRINE: No polyuria, nocturia,  HEMATOLOGY: No anemia, easy bruising or bleeding SKIN: No rash or lesion. MUSCULOSKELETAL: No joint pain or arthritis.   NEUROLOGIC: No tingling, numbness, weakness.  PSYCHIATRY: No anxiety or depression.   MEDICATIONS AT HOME:   Prior to Admission medications   Medication Sig Start Date End Date Taking? Authorizing Provider  azelastine (ASTELIN) 0.1 % nasal spray Place 1 spray into the nose 2 (two) times daily as needed. For allergies    Historical Provider, MD  calcium-vitamin D (OSCAL WITH D) 500-200 MG-UNIT per tablet Take 1 tablet by mouth 2 (two) times daily. 09/27/14  Corey Gleason, MD  chlorhexidine (PERIDEX) 0.12 % solution Use as directed 15 mLs in the mouth or throat 2 (two) times daily.    Historical Provider, MD  citalopram (CELEXA) 20 MG tablet Take 1 tablet (20 mg total) by mouth at bedtime. 09/24/14   Evlyn Kanner, NP  dexamethasone (DECADRON) 4 MG tablet Take 2 tablets (8 mg total) by mouth daily. 10/29/14   Evlyn Kanner, NP  docusate sodium (COLACE) 100 MG capsule Take 100 mg by mouth daily as needed. for constipation.    Historical Provider, MD  doxycycline  (VIBRA-TABS) 100 MG tablet Take 100 mg by mouth 2 (two) times daily.    Historical Provider, MD  enoxaparin (LOVENOX) 80 MG/0.8ML injection Inject 80 mg into the skin 2 (two) times daily.    Historical Provider, MD  fentaNYL (DURAGESIC - DOSED MCG/HR) 100 MCG/HR Place 1 patch (100 mcg total) onto the skin every other day. 11/15/14   Corey Gleason, MD  fentaNYL (DURAGESIC - DOSED MCG/HR) 25 MCG/HR patch Place 1 patch (25 mcg total) onto the skin every other day. Use with 180mcg and 27mcg patch for total dose of 264mcg. 11/22/14   Corey Gleason, MD  fentaNYL (DURAGESIC - DOSED MCG/HR) 75 MCG/HR Place 1 patch (75 mcg total) onto the skin every other day. 11/15/14   Corey Gleason, MD  fluticasone (FLONASE) 50 MCG/ACT nasal spray Place 2 sprays into the nose daily as needed. for allergies    Historical Provider, MD  HYDROmorphone (DILAUDID) 2 MG tablet Take 1 tablet (2 mg total) by mouth every 4 (four) hours as needed for severe pain. 11/22/14   Corey Gleason, MD  levofloxacin (LEVAQUIN) 500 MG tablet Take 1 tablet (500 mg total) by mouth daily. 11/22/14   Corey Gleason, MD  omeprazole (PRILOSEC OTC) 20 MG tablet Take 20 mg by mouth every morning.    Historical Provider, MD  oxyCODONE (ROXICODONE) 15 MG immediate release tablet Take 1-2 tablets (15-30 mg total) by mouth every 6 (six) hours as needed for pain. 11/03/14   Corey Gleason, MD  pregabalin (LYRICA) 75 MG capsule Take 1 capsule (75 mg total) by mouth 2 (two) times daily. 10/04/14   Corey Gleason, MD      VITAL SIGNS:  Blood pressure 105/65, pulse 85, temperature 98 F (36.7 C), temperature source Oral, resp. rate 10, SpO2 92 %.  PHYSICAL EXAMINATION:  GENERAL:  49 y.o.-year-old patient lying in the bed with no acute distress.  EYES: Pupils equal, round, reactive to light and accommodation. No scleral icterus. Extraocular muscles intact.  HEENT: Head atraumatic, normocephalic. Moist oral mucosa. Neck and the upper part of the chest are in dressing.  NECK:   Supple, no jugular venous distention.  Tenderness around the neck, in dressing without active bleeding.  LUNGS: Normal breath sounds bilaterally, no wheezing, rales,rhonchi or crepitation. No use of accessory muscles of respiration.  CARDIOVASCULAR: S1, S2 normal. No murmurs, rubs, or gallops.  ABDOMEN: Soft, nontender, nondistended. Bowel sounds present. No organomegaly or mass.  EXTREMITIES: No pedal edema, cyanosis, or clubbing.  NEUROLOGIC: Cranial nerves II through XII are intact. Muscle strength 5/5 in all extremities. Sensation intact. Gait not checked.  PSYCHIATRIC: The patient is alert and oriented x 3.  SKIN: No obvious rash, lesion, or ulcer.   LABORATORY PANEL:   CBC  Recent Labs Lab 11/25/14 0428  WBC 9.8  HGB 9.2*  HCT 27.9*  PLT 228   ------------------------------------------------------------------------------------------------------------------  Chemistries   Recent Labs Lab 11/25/14 0428  NA 135  K 4.4  CL 99*  CO2 30  GLUCOSE 139*  BUN 14  CREATININE 0.62  CALCIUM 8.2*  AST 15  ALT 39  ALKPHOS 120  BILITOT 0.6   ------------------------------------------------------------------------------------------------------------------  Cardiac Enzymes No results for input(s): TROPONINI in the last 168 hours. ------------------------------------------------------------------------------------------------------------------  RADIOLOGY:  No results found.  EKG:   Orders placed or performed during the hospital encounter of 09/25/14  . ED EKG  . ED EKG  . EKG 12-Lead  . EKG 12-Lead  . EKG    IMPRESSION AND PLAN:  Neck cellulitis Progressing tongue carcinoma Bleeding from tongue Carcinoma Anemia due to acute blood loss Intractable pain due to cancer   Continue vancomycin and Levaquin. Follow-up CBC. For bleeding from tongue Carcinoma and anemia, follow-up hemoglobin and ENT physician. Pain control with Dilaudid, fentanyl patch and Toradol. The  patient is a full code for now. Follow-up Dr. Oliva Sims and palliative care consult.    All the records are reviewed and case discussed with Consulting provider. Management plans discussed with the patient, the patient's mother and they are in agreement.  CODE STATUS: Full code  TOTAL TIME TAKING CARE OF THIS PATIENT: 48 minutes.    Demetrios Loll M.D on 11/25/2014 at 12:19 PM  Between 7am to 6pm - Pager - [phone]  After 6pm go to [email or web address] - password [email or web address] [facility]  [facility]  Office  [phone]  CC: Primary care Physician: Corey Mc, MD

## 2014-11-26 ENCOUNTER — Other Ambulatory Visit: Payer: Self-pay | Admitting: *Deleted

## 2014-11-26 DIAGNOSIS — C021 Malignant neoplasm of border of tongue: Secondary | ICD-10-CM

## 2014-11-26 DIAGNOSIS — Z021 Encounter for pre-employment examination: Secondary | ICD-10-CM

## 2014-11-26 LAB — BASIC METABOLIC PANEL
Anion gap: 5 (ref 5–15)
BUN: 15 mg/dL (ref 6–20)
CALCIUM: 7.9 mg/dL — AB (ref 8.9–10.3)
CHLORIDE: 104 mmol/L (ref 101–111)
CO2: 28 mmol/L (ref 22–32)
Creatinine, Ser: 0.47 mg/dL — ABNORMAL LOW (ref 0.61–1.24)
GFR calc Af Amer: >60 mL/min (ref >60)
Glucose, Bld: 124 mg/dL — ABNORMAL HIGH (ref 65–99)
Potassium: 3.9 mmol/L (ref 3.5–5.1)
SODIUM: 137 mmol/L (ref 135–145)

## 2014-11-26 LAB — CBC
HCT: 26.1 % — ABNORMAL LOW (ref 40.0–52.0)
Hemoglobin: 8.6 g/dL — ABNORMAL LOW (ref 13.0–18.0)
MCH: 33.5 pg (ref 26.0–34.0)
MCHC: 33 g/dL (ref 32.0–36.0)
MCV: 101.5 fL — ABNORMAL HIGH (ref 80.0–100.0)
PLATELETS: 215 10*3/uL (ref 150–440)
RBC: 2.57 MIL/uL — ABNORMAL LOW (ref 4.40–5.90)
RDW: 22.7 % — ABNORMAL HIGH (ref 11.5–14.5)
WBC: 8 10*3/uL (ref 3.8–10.6)

## 2014-11-26 LAB — CULTURE, BLOOD (ROUTINE X 2)

## 2014-11-26 LAB — VANCOMYCIN, TROUGH: VANCOMYCIN TR: 17 ug/mL (ref 10–20)

## 2014-11-26 LAB — MAGNESIUM: Magnesium: 1.9 mg/dL (ref 1.7–2.4)

## 2014-11-26 MED ORDER — PREGABALIN 75 MG PO CAPS
200.0000 mg | ORAL_CAPSULE | Freq: Two times a day (BID) | ORAL | Status: DC
  Administered 2014-11-26: 150 mg via ORAL
  Administered 2014-11-26 – 2014-12-01 (×10): 200 mg via ORAL
  Filled 2014-11-26 (×12): qty 2

## 2014-11-26 MED ORDER — METRONIDAZOLE IN NACL 5-0.79 MG/ML-% IV SOLN
500.0000 mg | Freq: Three times a day (TID) | INTRAVENOUS | Status: DC
  Administered 2014-11-26 – 2014-12-01 (×15): 500 mg via INTRAVENOUS
  Filled 2014-11-26 (×20): qty 100

## 2014-11-26 MED ORDER — HYDROMORPHONE 0.3 MG/ML IV SOLN
INTRAVENOUS | Status: DC
  Filled 2014-11-26: qty 25

## 2014-11-26 MED ORDER — DAKINS (1/4 STRENGTH) 0.125 % EX SOLN
Freq: Every day | CUTANEOUS | Status: DC | PRN
  Filled 2014-11-26: qty 473

## 2014-11-26 NOTE — Progress Notes (Signed)
Corey Sims Inpatient Post-Op Note  Patient ID: Corey Sims, male   DOB: 03/02/1966, 49 y.o.   MRN: [id number]  HISTORY:  This patient is well known to me. I did have an opportunity to discuss his care with Dr. Montine Circle blot from ENT. He was admitted to the intensive care unit through our our oncology department with pain in his left upper chest as well as fever and presumed infection in the wound. I have previously seen him multiple times in the wound care center and in the cancer center for management of his wounds. He is a young gentleman with a history of metastatic squamous cell carcinoma whose have multiple radiation and surgical procedures performed resulting in several large complex wounds present on the left neck and left upper chest wall. He is managed by our wound care center for the most part and his wife is diligent in her care as well. He is admitted over the last several days with a change in the nature of his wound. They noticed that this had more of an odor to it and in addition there is a large amount of bleeding from the wound which amounted to approximately 200 cc of both old and fresh blood. Of note is that the patient does take Lovenox for an upper extremity DVT and this is been placed on hold. There is been no bleeding today. He is currently receiving wet-to-dry dressings once a day and covering this with a absorbent pad.   Filed Vitals:   11/26/14 1300  BP: 136/77  Pulse:   Temp: 99.9 F (37.7 C)  Resp: 23     EXAM:  There is a large complex wound present on the base of the left neck and upper chest. I did remove some of the dressings but did not examine the depths of the wound itself. The wound has a fruity odor to it suggestive of Pseudomonas. There is some whitish material at the base which I believe is probably tumor. There is no active bleeding at this time.  ASSESSMENT: At the present time I do not believe there is anything that surgery can offer him. I would  recommend changing the solution to a Dakin's solution which may help with the management of his presumed Pseudomonas. The cultures are still pending.   PLAN:   I have written orders for him to have the dressings changed once a day with Dakin's solution instead of saline. He is on the appropriate antibiotics for any systemic aspects of his wound care. Further recommendations from oncology as per Dr. Ramond Craver he.    Nestor Lewandowsky, MD

## 2014-11-26 NOTE — Progress Notes (Signed)
[facility]  Patient Status: Active with visits up until hospitalization  [facility] is providing the following services:SN ( will need resumption orders upon discharge)  If patient discharges after hours, please call [phone].   Corey Sims 11/26/2014, 11:58 AM

## 2014-11-26 NOTE — Progress Notes (Signed)
[place] @ [facility] Telephone:(336) [phone]  Fax:([phone]) [place] OB: 07-Jul-1965  MR#: [id number]  HWK#:[id number]  Patient Care Team: Crecencio Mc, MD as PCP - General (Internal Medicine) Algernon Huxley, MD as Consulting Physician (Vascular Surgery)  CHIEF COMPLAINT:  No chief complaint on file.   Oncology History   33. 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 Margins were clear but close at 5 mm. Depth of invasion was 0.9 cm. There was no lymph vascular spread there was positive perineural spread. 22 nodes were at excised all negative for metastatic disease). 2. Mediastinal/hilar Adenopathy on PET scan - EBUS procedure and biopsy at [facility] on 09/02/2011 negative for malignancy.  Also RLL lung biopsy at [facility] on 08/30/2011 negative for malignancy. 3. Patient had lower left cervical lymph node removed.  (Surgery was done at [facility]) followed by introperative radiation therapy.(May of 2014) 4  Ebus in May of 2014 insufficient tissue 5. 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 6. Suspected Recurrent disease in the neck patient was started on cis-platinum, cetuximab and radiation therapy July17, 2014 7. Deep Vein  thromboses in the left lower extremity on Xaralto 8. Finished radiation and chemotherapy December 13, 2012 9. Abnormal CT scan and a PET scan.  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 Patient started on 5-FU , Cis-platinum, docetaxel from February 11, 2013 has finished total  5  cycles of chemotherapy. 10. Progressing disease.  Started on palliative radiation treatment with cetuximab.  (February, 201 5) 11. Finished radiation then cetuximab in April of 2015. 12. On carboplatinum and Taxol  and cetuximab 13 Started on  maintenance  cetuimab from January 21, 2014 14.progressive disease so started on  keytruda from March 15, 2014 15.ecent episode of thrombosis of left subclavian, left axillary veinpatient underwent thrombolytic therapy.  Removal of port.  (Because of infection)and started on Lovenox. 16.progressive disease on KEYTRUDA.  Palliative radiation therapy 17.palliative radiation therapy,march 201 18.  On IV methotrexate starting from April of 2016 19 progressive disease on methotrexate so patient was started on gemcitabine and vinorelbine from June of 2016     Primary cancer of tongue   08/26/2012 Initial Diagnosis Primary cancer of tongue    Oncology Flowsheet 11/01/2014 11/08/2014 11/19/2014 11/22/2014 11/24/2014 11/25/2014 11/26/2014  Day, Cycle Day 1, Cycle 1 Day 8, Cycle 1 - - - - -  dexamethasone (DECADRON) IV [ 20 mg ] [ 20 mg ] 10 mg 16 mg 16 mg 16 mg 16 mg  dexamethasone (DECADRON) PO - - - - - - -  enoxaparin (LOVENOX)  - - - - - - -  gemcitabine (GEMZAR) IV 800 mg/m2 800 mg/m2 - - - - -  methotrexate (PF) IV - - - - - - -  methylPREDNISolone sodium succinate 125 mg/2 mL (SOLU-MEDROL) IM - - - - - - -  ondansetron (ZOFRAN) IV [ 16 mg ] [ 16 mg ] - - - - -  vinorelbine (NAVELBINE) IV 20 mg/m2 20 mg/m2 - - - - -    INTERVAL HISTORY: 49 year old gentleman with carcinoma of head and neck stage IV disease. Patient has been requiring frequent doses of hydromorphone IV. Dressing is oozing with serosanguinous drainage from neck and chest wall on left  side. He feels better today than yesterday. Continues on IV fluids and antibiotics. Thus far cultures have been reported as having gram negative rods.   As per HPI. Otherwise, a complete review of systems is negatve.  PAST MEDICAL HISTORY: Past Medical History  Diagnosis Date  . Allergy   . Cancer     tongue and neck  . DVT (deep venous thrombosis)     left arm and left leg    PAST SURGICAL HISTORY: Past Surgical History  Procedure  Laterality Date  . Excision of tongue lesion with laser  08/13/11  . Neck dissection Bilateral 08/13/11  . Thoracotomy    . Peripheral vascular catheterization  10/27/2014    Procedure: PICC Line Insertion;  Surgeon: Katha Cabal, MD;  Location: [facility];  Service: Cardiovascular;;    FAMILY HISTORY Family History  Problem Relation Age of Onset  . Cancer Father 58    prostate          ADVANCED DIRECTIVES: Advance care directive: Patient does have advanced healthcare directive and has been reviewed.  Patient wants to continue with Limited Code and would like to fill out an advance directive.     HEALTH MAINTENANCE: History  Substance Use Topics  . Smoking status: Never Smoker   . Smokeless tobacco: Not on file  . Alcohol Use: No     Comment: very limited      Allergies  Allergen Reactions  . Azithromycin Other (See Comments)    Pt states he gets real flushed red and sweaty.  . Penicillins Hives    Childhood reaction reported to patient by mother.  . Sulfa Antibiotics     Childhood reactions  . Erythromycin Base Swelling    Current Facility-Administered Medications  Medication Dose Route Frequency Provider Last Rate Last Dose  . 0.9 %  sodium chloride infusion   Intravenous Continuous [name], MD 100 mL/hr at 11/26/14 0048    . acetaminophen (TYLENOL) tablet 650 mg  650 mg Oral Q4H PRN [name], MD      . dexamethasone (DECADRON) 16 mg in sodium chloride 0.9 % 50 mL IVPB  16 mg Intravenous Daily [name], MD   16 mg at 11/26/14 1023  . fentaNYL (DURAGESIC - dosed mcg/hr) 200 mcg  200 mcg Transdermal Q48H Lytle Butte, MD   200 mcg at 11/26/14 0012  . HYDROmorphone (DILAUDID) injection 3 mg  3 mg Intravenous Q1H PRN [name], MD   3 mg at 11/26/14 1155  . HYDROmorphone (DILAUDID) PCA injection 0.3 mg/mL   Intravenous 6 times per day Evlyn Kanner, NP      . HYDROmorphone (DILAUDID) tablet 4 mg  4 mg Oral QID [name], MD   4 mg  at 11/26/14 1358  . ketorolac (TORADOL) 15 MG/ML injection 15 mg  15 mg Intravenous Q12H [name], MD   15 mg at 11/26/14 1047  . levofloxacin (LEVAQUIN) IVPB 500 mg  500 mg Intravenous Q24H [name], MD   500 mg at 11/25/14 1950  . metroNIDAZOLE (FLAGYL) IVPB 500 mg  500 mg Intravenous Q8H Theodoro Grist, MD   500 mg at 11/26/14 1401  . polyethylene glycol (MIRALAX / GLYCOLAX) packet 17 g  17 g Oral Daily [name], MD   17 g at 11/26/14 1023  . pregabalin (LYRICA) capsule 200 mg  200 mg Oral BID Theodoro Grist, MD   150 mg at 11/26/14 1015  . senna-docusate (Senokot-S) tablet 2 tablet  2 tablet Oral BID [name], MD   2 tablet at 11/26/14 1014  . vancomycin (VANCOCIN) 1,500 mg in sodium chloride 0.9 % 500 mL IVPB  1,500 mg Intravenous Q12H [name], MD   1,500 mg at 11/26/14 1047    OBJECTIVE:  Filed Vitals:   11/26/14 1300  BP: 136/77  Pulse:   Temp: 99.9 F (37.7 C)  Resp: 23     There is no weight on file to calculate BMI.    ECOG FS:2 - Symptomatic, <50% confined to bed  Review of system: Gen. status: Performance status is declining but has improved over the last 24 hours. Continues with severe pain.  HEENT: Increasing swelling on the left side of the neck and face with drainage. Lungs: Increasing shortness of breath cough Cardiac: No chest pain.  No shortness of breath.   GI: Poor appetite and losing weight.   GU: No dysuria or hematuria.   MS: Lower extremities no swelling. Neurological system: No dizziness.  No tingling.  No focal weakness. Overall declining performance status  LAB RESULTS:  Admission on 11/24/2014  Component Date Value Ref Range Status  . WBC 11/25/2014 9.8  3.8 - 10.6 K/uL Final  . RBC 11/25/2014 2.77* 4.40 - 5.90 MIL/uL Final  . Hemoglobin 11/25/2014 9.2* 13.0 - 18.0 g/dL Final  . HCT 11/25/2014 27.9* 40.0 - 52.0 % Final  . MCV 11/25/2014 100.8* 80.0 - 100.0 fL Final  . MCH 11/25/2014 33.3  26.0 - 34.0 pg Final  . MCHC 11/25/2014  33.1  32.0 - 36.0 g/dL Final  . RDW 11/25/2014 21.7* 11.5 - 14.5 % Final  . Platelets 11/25/2014 228  150 - 440 K/uL Final  . Sodium 11/25/2014 135  135 - 145 mmol/L Final  . Potassium 11/25/2014 4.4  3.5 - 5.1 mmol/L Final  . Chloride 11/25/2014 99* 101 - 111 mmol/L Final  . CO2 11/25/2014 30  22 - 32 mmol/L Final  . Glucose, Bld 11/25/2014 139* 65 - 99 mg/dL Final  . BUN 11/25/2014 14  6 - 20 mg/dL Final  . Creatinine, Ser 11/25/2014 0.62  0.61 - 1.24 mg/dL Final  . Calcium 11/25/2014 8.2* 8.9 - 10.3 mg/dL Final  . Total Protein 11/25/2014 4.8* 6.5 - 8.1 g/dL Final  . Albumin 11/25/2014 1.9* 3.5 - 5.0 g/dL Final  . AST 11/25/2014 15  15 - 41 U/L Final  . ALT 11/25/2014 39  17 - 63 U/L Final  . Alkaline Phosphatase 11/25/2014 120  38 - 126 U/L Final  . Total Bilirubin 11/25/2014 0.6  0.3 - 1.2 mg/dL Final  . GFR calc non Af Amer 11/25/2014 >60  >60 mL/min Final  . GFR calc Af Amer 11/25/2014 >60  >60 mL/min Final   Comment: (NOTE) The eGFR has been calculated using the CKD EPI equation. This calculation has not been validated in all clinical situations. eGFR's persistently <60 mL/min signify possible Chronic Kidney Disease.   . Anion gap 11/25/2014 6  5 - 15 Final  . Specimen Description 11/24/2014 BLOOD RIGHT ASSIST CONTROL   Final  . Special Requests 11/24/2014 BOTTLES DRAWN AEROBIC AND ANAEROBIC 10ML   Final  . Culture 11/24/2014 NO GROWTH 2 DAYS   Final  . Report Status 11/24/2014 PENDING   Incomplete  . Specimen Description 11/24/2014 Blood   Final  . Special Requests 11/24/2014 NONE   Final  . Report Status 11/24/2014 11/26/2014 FINAL   Final  . Specimen Description 11/24/2014 Blood   Final  .  Special Requests 11/24/2014 NONE   Final  . Report Status 11/24/2014 11/26/2014 FINAL   Final  . MRSA by PCR 11/24/2014 NEGATIVE  NEGATIVE Final  . Sodium 11/26/2014 137  135 - 145 mmol/L Final  . Potassium 11/26/2014 3.9  3.5 - 5.1 mmol/L Final  . Chloride 11/26/2014 104  101 -  111 mmol/L Final  . CO2 11/26/2014 28  22 - 32 mmol/L Final  . Glucose, Bld 11/26/2014 124* 65 - 99 mg/dL Final  . BUN 11/26/2014 15  6 - 20 mg/dL Final  . Creatinine, Ser 11/26/2014 0.47* 0.61 - 1.24 mg/dL Final  . Calcium 11/26/2014 7.9* 8.9 - 10.3 mg/dL Final  . GFR calc non Af Amer 11/26/2014 >60  >60 mL/min Final  . GFR calc Af Amer 11/26/2014 >60  >60 mL/min Final   Comment: (NOTE) The eGFR has been calculated using the CKD EPI equation. This calculation has not been validated in all clinical situations. eGFR's persistently <60 mL/min signify possible Chronic Kidney Disease.   . Anion gap 11/26/2014 5  5 - 15 Final  . WBC 11/26/2014 8.0  3.8 - 10.6 K/uL Final  . RBC 11/26/2014 2.57* 4.40 - 5.90 MIL/uL Final  . Hemoglobin 11/26/2014 8.6* 13.0 - 18.0 g/dL Final  . HCT 11/26/2014 26.1* 40.0 - 52.0 % Final  . MCV 11/26/2014 101.5* 80.0 - 100.0 fL Final  . MCH 11/26/2014 33.5  26.0 - 34.0 pg Final  . MCHC 11/26/2014 33.0  32.0 - 36.0 g/dL Final  . RDW 11/26/2014 22.7* 11.5 - 14.5 % Final  . Platelets 11/26/2014 215  150 - 440 K/uL Final  . Magnesium 11/26/2014 1.9  1.7 - 2.4 mg/dL Final  . Specimen Description 11/25/2014 BONE   Final  . Special Requests 11/25/2014 Immunocompromised   Final  . Gram Stain 11/25/2014    Final                   Value:FEW WBC SEEN MANY GRAM NEGATIVE RODS MANY GRAM POSITIVE COCCI   . Culture 11/25/2014    Final                   Value:LIGHT GROWTH GRAM NEGATIVE RODS IDENTIFICATION AND SUSCEPTIBILITIES TO FOLLOW ONCE ISOLATED   . Report Status 11/25/2014 PENDING   Incomplete     ASSESSMENT: 49 year old gentleman with stage IV and recurrent and progressive carcinoma of tongue. Patient with progressing disease and uncontrolled pain. Discussed again code status and he wishes to remain a Limited Code and would like to officially fill out an advance directive.  Started on Dilaudid PCA pump this morning due to requiring frequent doses for management of  pain.   Family and patient with numerous questions regarding continued chemotherapy that was discussed with Dr. Oliva Bustard on Thursday. Encouraged them to discuss this with him on Monday but right now our goal should be pain management.   As previously noted by Dr. Oliva Bustard: Palliative care options versus further chemotherapy with cis-platinum and 5-FU.  And or vectibix or cetuximab.    Primary cancer of tongue   Staging form: Lip and Oral Cavity, AJCC 7th Edition     Clinical: Stage IVC (T2, N2, M1) - Unsigned   Evlyn Kanner, NP   11/26/2014 3:48 PM

## 2014-11-26 NOTE — Progress Notes (Signed)
ANTIBIOTIC CONSULT NOTE - FOLLOW UP  Pharmacy Consult for Vancomycin Dosing Indication: cellulitis  Allergies  Allergen Reactions  . Azithromycin Other (See Comments)    Pt states he gets real flushed red and sweaty.  . Penicillins Hives    Childhood reaction reported to patient by mother.  . Sulfa Antibiotics     Childhood reactions  . Erythromycin Base Swelling    Patient Measurements:   Vital Signs: Temp: 97.7 F (36.5 C) (07/15 1915) Temp Source: Oral (07/15 1915) BP: 108/63 mmHg (07/15 2300) Intake/Output from previous day: 07/14 0701 - 07/15 0700 In: 3300 [P.O.:400; I.V.:1200; IV Piggyback:1700] Out: 1850 [Urine:1850] Intake/Output from this shift: Total I/O In: 600 [I.V.:400; IV Piggyback:200] Out: -   Labs:  Recent Labs  11/25/14 0428 11/26/14 0523  WBC 9.8 8.0  HGB 9.2* 8.6*  PLT 228 215  CREATININE 0.62 0.47*   Estimated Creatinine Clearance: 127.6 mL/min (by C-G formula based on Cr of 0.47).  Recent Labs  11/26/14 2212  [place]     Microbiology: Recent Results (from the past 720 hour(s))  MRSA PCR Screening     Status: None   Collection Time: 11/24/14  6:22 PM  Result Value Ref Range Status   MRSA by PCR NEGATIVE NEGATIVE Final  Culture, blood (routine x 2)     Status: None (Preliminary result)   Collection Time: 11/24/14  6:52 PM  Result Value Ref Range Status   Specimen Description BLOOD RIGHT ASSIST CONTROL  Final   Special Requests BOTTLES DRAWN AEROBIC AND ANAEROBIC 10ML  Final   Culture NO GROWTH 2 DAYS  Final   Report Status PENDING  Incomplete  Culture, blood (routine x 2)     Status: None   Collection Time: 11/24/14  6:52 PM  Result Value Ref Range Status   Specimen Description Blood  Final   Special Requests NONE  Final   Report Status 11/26/2014 FINAL  Final  Culture, blood (routine x 2)     Status: None   Collection Time: 11/24/14  6:52 PM  Result Value Ref Range Status   Specimen Description Blood  Final   Special Requests NONE  Final   Report Status 11/26/2014 FINAL  Final  Wound culture     Status: None (Preliminary result)   Collection Time: 11/25/14  6:20 PM  Result Value Ref Range Status   Specimen Description BONE  Final   Special Requests Immunocompromised  Final   Gram Stain   Final    FEW WBC SEEN MANY GRAM NEGATIVE RODS MANY GRAM POSITIVE COCCI    Culture   Final    LIGHT GROWTH GRAM NEGATIVE RODS IDENTIFICATION AND SUSCEPTIBILITIES TO FOLLOW ONCE ISOLATED    Report Status PENDING  Incomplete    Anti-infectives    Start     Dose/Rate Route Frequency Ordered Stop   11/26/14 1000  metroNIDAZOLE (FLAGYL) IVPB 500 mg     500 mg 100 mL/hr over 60 Minutes Intravenous Every 8 hours 11/26/14 0945     11/24/14 2200  vancomycin (VANCOCIN) 1,500 mg in sodium chloride 0.9 % 500 mL IVPB     1,500 mg 250 mL/hr over 120 Minutes Intravenous Every 12 hours 11/24/14 2128     11/24/14 1900  levofloxacin (LEVAQUIN) IVPB 500 mg     500 mg 100 mL/hr over 60 Minutes Intravenous Every 24 hours 11/24/14 1828     11/24/14 1830  vancomycin (VANCOCIN) IVPB 1000 mg/200 mL premix  Status:  Discontinued  1,000 mg 200 mL/hr over 60 Minutes Intravenous  Once 11/24/14 1828 11/24/14 2128       Assessment: Patient with progressing carcinoma base of tongue. Failing multiple chemotherapy program and radiation and surgical program in the past with intractable pain not controlled with oral pain medication. Patient being treated with vancomycin (day 4) and levofloxacin (day 4) for cellulitis of neck.  Wound culture/bone growing GPC and GNR.  Unclear if possible treatment of osteomyelitis.  Therefore, will make goal trough 15-20.  Trough level of 17 at 2212 on 7/15.  Goal of Therapy:  Vancomycin trough level 15-20  Plan:   Continue vancomycin 1500mg  IV q12hr for goal trough of 15-20.  Trough today prior to dose within goal range.  Will recheck trough as clinically indication.  Follow SCr.   Follow up cultures.  Pharmacy will continue to monitor and adjust per consult.    Melda Mermelstein G 11/26/2014,11:36 PM

## 2014-11-26 NOTE — Progress Notes (Signed)
[facility] [place] NAME: Corey Sims    MR#:  [id number]  DATE OF BIRTH:  01/19/1966  SUBJECTIVE:  CHIEF COMPLAINT:  No chief complaint on file.   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 He was admitted to the hospital because of severe pain. He was noted to have bleeding from his wounds and Lovenox, which was given as a treatment of his left upper extremity DVT was stopped. Patient still complains of significant pain and now he is going to be on Dilaudid PCA pump.  VITAL SIGNS: Blood pressure 146/74, pulse 85, temperature 97.6 F (36.4 C), temperature source Axillary, resp. rate 26, SpO2 92 %.  PHYSICAL EXAMINATION:   GENERAL:  49 y.o.-year-old patient lying in the bed due to severe distress due to pain is stiff , tense and stressed out.  EYES: Pupils equal, round, reactive to light and accommodation. No scleral icterus. Extraocular muscles intact.  HEENT: Head atraumatic, normocephalic. Oropharynx and nasopharynx clear. Unable to evaluate. Oral cavity due to significant pains. His left side of chest and neck is dressed with a ABD pads NECK:  Supple, no jugular venous distention. No thyroid enlargement, no tenderness.  LUNGS: Diminished breath sounds in the left, no wheezing, rales,rhonchi or crepitation. Good air entrance on the right, however, significant rales and rhonchi.  No use of accessory muscles of respiration.  CARDIOVASCULAR: S1, S2 normal. No murmurs, rubs, or gallops.  ABDOMEN: Soft, nontender, nondistended. Bowel sounds present. No  organomegaly or mass.  EXTREMITIES: No pedal edema, cyanosis, or clubbing. Left upper extremity is swollen, very tender to touch and even superficial.  NEUROLOGIC: Cranial nerves II through XII are intact. Muscle strength 5/5 in all extremities. Sensation intact. Gait not checked.  PSYCHIATRIC: The patient is alert and oriented x 3.  SKIN: No obvious rash, lesion, or ulcer.   ORDERS/RESULTS REVIEWED:   CBC  Recent Labs Lab 11/22/14 1345 11/25/14 0428 11/26/14 0523  WBC 10.7* 9.8 8.0  HGB 10.9* 9.2* 8.6*  HCT 33.8* 27.9* 26.1*  PLT 279 228 215  MCV 100.3* 100.8* 101.5*  MCH 32.2 33.3 33.5  MCHC 32.1 33.1 33.0  RDW 21.6* 21.7* 22.7*  LYMPHSABS 0.4*  --   --   MONOABS 0.5  --   --   EOSABS 0.0  --   --   BASOSABS 0.0  --   --    ------------------------------------------------------------------------------------------------------------------  Chemistries   Recent Labs Lab 11/22/14 1345 11/25/14 0428 11/26/14 0523  NA 130* 135 137  K 3.5 4.4 3.9  CL 94* 99* 104  CO2 30 30 28   GLUCOSE 95 139* 124*  BUN 10 14 15   CREATININE 0.71 0.62 0.47*  CALCIUM 7.7* 8.2* 7.9*  MG  --   --  1.9  AST 17 15  --   ALT 71* 39  --   ALKPHOS 165* 120  --   BILITOT 0.5 0.6  --    ------------------------------------------------------------------------------------------------------------------ estimated creatinine clearance is 127.6 mL/min (by C-G formula based on Cr of 0.47). ------------------------------------------------------------------------------------------------------------------ No results for input(s): TSH, T4TOTAL, T3FREE, THYROIDAB in  the last 72 hours.  Invalid input(s): FREET3  Cardiac Enzymes No results for input(s): CKMB, TROPONINI, MYOGLOBIN in the last 168 hours.  Invalid input(s): CK ------------------------------------------------------------------------------------------------------------------ Invalid input(s):  POCBNP ---------------------------------------------------------------------------------------------------------------  RADIOLOGY: No results found.  EKG:  Orders placed or performed during the hospital encounter of 09/25/14  . ED EKG  . ED EKG  . EKG 12-Lead  . EKG 12-Lead  . EKG    ASSESSMENT AND PLAN:  Active Problems:   Intractable pain  1. Infected surgical wound , the lightest of chest and neck area, pink, wound consult. Continue antibiotic therapy on vancomycin, levofloxacin and adding Flagyl to cover anaerobic infection. Get ED consult and following culture results. So far blood cultures were negative. Wound cultures are pending 2. Intractable pain, likely due to infection. Continue pain management with Dilaudid PCA per oncology's recommendations. Advance Lyrica,  double it, watching for side effects. Advance it as needed to maximum of 600 mg daily 3. Hyponatremia, resolved with IV fluid administration 4. 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 Oliva Bustard 5. Left upper extremity DVT patient needs to have his Lovenox restarted as possible 6. Wound hemorrhage with acute posthemorrhagic anemia, follow hemoglobin level daily and transfuse patient as needed  Management plans discussed with the patient, family and they are in agreement.   DRUG ALLERGIES:  Allergies  Allergen Reactions  . Azithromycin Other (See Comments)    Pt states he gets real flushed red and sweaty.  . Penicillins Hives    Childhood reaction reported to patient by mother.  . Sulfa Antibiotics     Childhood reactions  . Erythromycin Base Swelling    CODE STATUS:     Code Status Orders        Start     Ordered   11/25/14 1400  Limited resuscitation (code)   Continuous    Question Answer Comment  In the event of cardiac or respiratory ARREST: Initiate Code Blue, Call Rapid Response No   In the event of cardiac or respiratory ARREST: Perform CPR No   In the event of cardiac  or respiratory ARREST: Perform Intubation/Mechanical Ventilation No   In the event of cardiac or respiratory ARREST: Use NIPPV/BiPAp only if indicated Yes   In the event of cardiac or respiratory ARREST: Administer ACLS medications if indicated Yes   In the event of cardiac or respiratory ARREST: Perform Defibrillation or Cardioversion if indicated No      11/25/14 1400      TOTAL CRITICAL CARE TIME TAKING CARE OF THIS PATIENT: 50 minutes.    Theodoro Grist M.D on 11/26/2014 at 11:45 AM  Between 7am to 6pm - Pager - [phone]  After 6pm go to [email or web address] - password [email or web address] [facility]  Office  [phone]  CC: Primary care physician; Crecencio Mc, MD

## 2014-11-26 NOTE — Care Management Note (Signed)
Case Management Note  Patient Details  Name: Corey Sims MRN: [id number] Date of Birth: 04-Jun-1965  Subjective/Objective:   Admitted with intractable  pain secondary to stage II squamous cell carcinoma left tongue status post neck dissection,  partial glossectomy. Lives at home with his wife and 48 year old child. Met with patient, wife and his mother.  Patient usually is independent with most adls. He does not drive or use DME.  Active with [facility] for weekly nursing visits for PICC line care, notified of admission.  Per wound care nurse, he has wounds to neck and chest with drainage. Followed by wound care nurse at cancer center. It is noted that patient has a significant amount of bleeding form wound yesterday. Patient and family appreciative of care.                   Action/Plan:   Expected Discharge Date:                  Expected Discharge Plan:     In-House Referral:     Discharge planning Services     Post Acute Care Choice:    Choice offered to:     DME Arranged:    DME Agency:     HH Arranged:    HH Agency:     Status of Service:  In process, will continue to follow  Medicare Important Message Given:    Date Medicare IM Given:    Medicare IM give by:    Date Additional Medicare IM Given:    Additional Medicare Important Message give by:     If discussed at [place] of Stay Meetings, dates discussed:    Additional Comments:  Jolly Mango, RN 11/26/2014, 10:31 AM

## 2014-11-26 NOTE — Progress Notes (Signed)
Per patient refused PCA due to not wanting to start a secondary IV and would prefer IV pushes. Consulted Wound and surgery. Advised Dr V that we currently did not have someone here for [facility] care.

## 2014-11-26 NOTE — Progress Notes (Signed)
Initial Nutrition Assessment    INTERVENTION:   Meals and Snacks: Cater to patient preferences; provided pt and wife with regular menu and explained menu order process; pt likes sweet tea, placed order for 1/2 gallon of sweet tea to be delivered daily. Pt likes snacks, place order for snack at 3 pm Medical Food Supplement Therapy: nutritional supplements available to patient if pt interested    NUTRITION DIAGNOSIS:   Increased nutrient needs related to cancer and cancer related treatments as evidenced by estimated needs.   GOAL:   Patient will meet greater than or equal to 90% of their needs   MONITOR:    (Energy Intake, Anthropometrics, Electrolyte/Renal Profile, Digestive System)  REASON FOR ASSESSMENT:   Diagnosis    ASSESSMENT:   Pt admitted with intractable pain, infection; pt metastatic squamous cell cancer of the tongue, open wounds in neck and chest area, wife and mother at bedside on visit  Past Medical History  Diagnosis Date  . Allergy   . Cancer     tongue and neck  . DVT (deep venous thrombosis)     left arm and left leg   Diet Order:  Diet regular Room service appropriate?: Yes; Fluid consistency:: Thin   Energy Intake: pt with good appetite, ate blueberry muffin, most of oatmeal this AM. Recorded po intake 20-50% of meals yesterday  Food nutritition related history: per wife, good appetite at home prior to admission  Digestive System: no problems swallowing at present, some pain with eating, +xerostomia   Skin:   (open wounds on neck and chest)   Nutrition-Focused Physical Exam:  Unable to complete Nutrition-Focused physical exam at this time.    Electrolyte and Renal Profile:  Recent Labs Lab 11/22/14 1345 11/25/14 0428 11/26/14 0523  BUN 10 14 15   CREATININE 0.71 0.62 0.47*  NA 130* 135 137  K 3.5 4.4 3.9  MG  --   --  1.9   Glucose Profile: No results for input(s): GLUCAP in the last 72 hours. Nutritional Anemia Profile:  CBC  Latest Ref Rng 11/26/2014 11/25/2014 11/22/2014  WBC 3.8 - 10.6 K/uL 8.0 9.8 10.7(H)  Hemoglobin 13.0 - 18.0 g/dL 8.6(L) 9.2(L) 10.9(L)  Hematocrit 40.0 - 52.0 % 26.1(L) 27.9(L) 33.8(L)  Platelets 150 - 440 K/uL 215 228 279    Protein Profile:   Recent Labs Lab 11/22/14 1345 11/25/14 0428  ALBUMIN 2.4* 1.9*    Height:   Ht Readings from Last 1 Encounters:  10/27/14 6\' 1"  (1.854 m)    Weight:   Wt Readings from Last 1 Encounters:  11/22/14 179 lb 3.7 oz (81.3 kg)   **Recent weight relatively stable  Wt Readings from Last 10 Encounters:  11/22/14 179 lb 3.7 oz (81.3 kg)  11/08/14 174 lb 13.2 oz (79.3 kg)  11/01/14 179 lb 0.2 oz (81.2 kg)  10/27/14 177 lb (80.287 kg)  10/18/14 179 lb 14.3 oz (81.6 kg)  10/15/14 174 lb 13.2 oz (79.3 kg)  10/12/14 174 lb (78.926 kg)  10/04/14 175 lb 11.3 oz (79.7 kg)  09/27/14 173 lb 11.6 oz (78.8 kg)  09/26/14 176 lb (79.833 kg)    BMI:  There is no weight on file to calculate BMI.  Estimated Nutritional Needs:   Kcal:  8119-1478 kcals (BEE 1731, 1.3 AF, 1.1-1.3 IF)   Protein:  97-113 g (1.2-1.4 g/kg)   Fluid:  2025-2430 mL (25-30 ml/kg)   EDUCATION NEEDS:   No education needs identified at this time  MODERATE Care Level  Kerman Passey [place], [place], LDN [phone] Pager

## 2014-11-26 NOTE — Consult Note (Signed)
WOC wound consult note Reason for Consult: Wound care orders for lesions to left neck and chest. Patient seen by [facility] and seen at outpatient cancer center by [facility] nurse.  Wound type:s/p radiation treatments Pressure Ulcer POA: N/A Measurement:See flowsheets  Wound bed:100% pink and moist Drainage (amount, consistency, odor) Moderate serosanguinous.  Was bleeding more, stopped Lovenox Periwound:INtact.  Skin is fragile to periwound and wounds may increase in size due to radiation exposure Dressing procedure/placement/frequency:Cleanse neck and chest wounds with NS and pat gently dry.  Gently fill each wound with one 4x4 gauze (opened and fluffed).  Cover with Drawtex absorbent material and secure with ABD pad or silicone border foam.  Change daily.  Will not follow at this time.  Please re-consult if needed.  Domenic Moras RN BSN [place] Pager [phone]

## 2014-11-26 NOTE — Care Management Note (Signed)
Case Management Note  Patient Details  Name: ALVIE FOWLES MRN: [id number] Date of Birth: [birth year]  Subjective/Objective: Spoke with hospitalist regarding palliative care availability. She is aware that a palliative is not available for another week so any conversations with patient regarding EOL or goals of care must take place by a current covering physician. She states that Dr. Genevive Bi is going to evaluate patient and then Dr. Oliva Bustard will disuss goals of care with family   And patient when appropriate.                  Action/Plan:   Expected Discharge Date:                  Expected Discharge Plan:     In-House Referral:     Discharge planning Services     Post Acute Care Choice:    Choice offered to:     DME Arranged:    DME Agency:     HH Arranged:    HH Agency:     Status of Service:  In process, will continue to follow  Medicare Important Message Given:    Date Medicare IM Given:    Medicare IM give by:    Date Additional Medicare IM Given:    Additional Medicare Important Message give by:     If discussed at [place] of Stay Meetings, dates discussed:    Additional Comments:  Jolly Mango, RN 11/26/2014, 2:42 PM

## 2014-11-27 DIAGNOSIS — D500 Iron deficiency anemia secondary to blood loss (chronic): Secondary | ICD-10-CM

## 2014-11-27 DIAGNOSIS — R531 Weakness: Secondary | ICD-10-CM

## 2014-11-27 DIAGNOSIS — R58 Hemorrhage, not elsewhere classified: Secondary | ICD-10-CM

## 2014-11-27 LAB — HEMOGLOBIN: HEMOGLOBIN: 8.7 g/dL — AB (ref 13.0–18.0)

## 2014-11-27 MED ORDER — CITALOPRAM HYDROBROMIDE 20 MG PO TABS
20.0000 mg | ORAL_TABLET | Freq: Every day | ORAL | Status: DC
Start: 2014-11-27 — End: 2014-11-29
  Administered 2014-11-27 – 2014-11-28 (×2): 20 mg via ORAL
  Filled 2014-11-27 (×2): qty 1

## 2014-11-27 MED ORDER — HYDROMORPHONE HCL 2 MG PO TABS
6.0000 mg | ORAL_TABLET | Freq: Four times a day (QID) | ORAL | Status: DC
  Administered 2014-11-28: 6 mg via ORAL
  Filled 2014-11-27: qty 3

## 2014-11-27 MED ORDER — HYDROMORPHONE HCL 2 MG PO TABS
6.0000 mg | ORAL_TABLET | Freq: Four times a day (QID) | ORAL | Status: DC
  Administered 2014-11-27 (×4): 6 mg via ORAL
  Filled 2014-11-27 (×4): qty 3

## 2014-11-27 MED ORDER — FENTANYL 75 MCG/HR TD PT72
225.0000 ug | MEDICATED_PATCH | TRANSDERMAL | Status: DC
  Administered 2014-11-27 – 2014-11-29 (×2): 225 ug via TRANSDERMAL
  Filled 2014-11-27 (×3): qty 3

## 2014-11-27 NOTE — Progress Notes (Signed)
ANTIBIOTIC CONSULT NOTE - FOLLOW UP  Pharmacy Consult for Vancomycin Dosing Indication: cellulitis  Allergies  Allergen Reactions  . Azithromycin Other (See Comments)    Pt states he gets real flushed red and sweaty.  . Penicillins Hives    Childhood reaction reported to patient by mother.  . Sulfa Antibiotics     Childhood reactions  . Erythromycin Base Swelling    Patient Measurements:   Vital Signs: Temp: 97.7 F (36.5 C) (07/16 0400) Temp Source: Oral (07/16 0400) BP: 118/61 mmHg (07/16 0700) Intake/Output from previous day: 07/15 0701 - 07/16 0700 In: 4260 [P.O.:460; I.V.:2400; IV Piggyback:1400] Out: 2650 [Urine:2650] Intake/Output from this shift:    Labs:  Recent Labs  11/25/14 0428 11/26/14 0523 11/27/14 0606  WBC 9.8 8.0  --   HGB 9.2* 8.6* 8.7*  PLT 228 215  --   CREATININE 0.62 0.47*  --    Estimated Creatinine Clearance: 127.6 mL/min (by C-G formula based on Cr of 0.47).  Recent Labs  11/26/14 2212  [place]     Microbiology: Recent Results (from the past 720 hour(s))  MRSA PCR Screening     Status: None   Collection Time: 11/24/14  6:22 PM  Result Value Ref Range Status   MRSA by PCR NEGATIVE NEGATIVE Final  Culture, blood (routine x 2)     Status: None (Preliminary result)   Collection Time: 11/24/14  6:52 PM  Result Value Ref Range Status   Specimen Description BLOOD RIGHT ASSIST CONTROL  Final   Special Requests BOTTLES DRAWN AEROBIC AND ANAEROBIC 10ML  Final   Culture NO GROWTH 2 DAYS  Final   Report Status PENDING  Incomplete  Culture, blood (routine x 2)     Status: None   Collection Time: 11/24/14  6:52 PM  Result Value Ref Range Status   Specimen Description Blood  Final   Special Requests NONE  Final   Report Status 11/26/2014 FINAL  Final  Culture, blood (routine x 2)     Status: None   Collection Time: 11/24/14  6:52 PM  Result Value Ref Range Status   Specimen Description Blood  Final   Special Requests NONE   Final   Report Status 11/26/2014 FINAL  Final  Wound culture     Status: None (Preliminary result)   Collection Time: 11/25/14  6:20 PM  Result Value Ref Range Status   Specimen Description BONE  Final   Special Requests Immunocompromised  Final   Gram Stain   Final    FEW WBC SEEN MANY GRAM NEGATIVE RODS MANY GRAM POSITIVE COCCI    Culture   Final    LIGHT GROWTH GRAM NEGATIVE RODS IDENTIFICATION AND SUSCEPTIBILITIES TO FOLLOW    Report Status PENDING  Incomplete    Anti-infectives    Start     Dose/Rate Route Frequency Ordered Stop   11/26/14 1000  metroNIDAZOLE (FLAGYL) IVPB 500 mg     500 mg 100 mL/hr over 60 Minutes Intravenous Every 8 hours 11/26/14 0945     11/24/14 2200  vancomycin (VANCOCIN) 1,500 mg in sodium chloride 0.9 % 500 mL IVPB     1,500 mg 250 mL/hr over 120 Minutes Intravenous Every 12 hours 11/24/14 2128     11/24/14 1900  levofloxacin (LEVAQUIN) IVPB 500 mg     500 mg 100 mL/hr over 60 Minutes Intravenous Every 24 hours 11/24/14 1828     11/24/14 1830  vancomycin (VANCOCIN) IVPB 1000 mg/200 mL premix  Status:  Discontinued  1,000 mg 200 mL/hr over 60 Minutes Intravenous  Once 11/24/14 1828 11/24/14 2128       Assessment: Patient with progressing carcinoma base of tongue. Failing multiple chemotherapy program and radiation and surgical program in the past with intractable pain not controlled with oral pain medication. Patient being treated with vancomycin (day 4) and levofloxacin (day 4) for cellulitis of neck.  Wound culture/bone growing GPC and GNR.  Unclear if possible treatment of osteomyelitis.  Therefore, will make goal trough 15-20.  Trough level of 17 at 2212 on 7/15.  Goal of Therapy:  Vancomycin trough level 15-20  Plan:   Continue vancomycin 1500mg  IV q12hr for goal trough of 15-20.  Trough today prior to dose within goal range.  Will recheck trough as clinically indication.  Follow SCr.  Follow up cultures.  Pharmacy will  continue to monitor and adjust per consult.    Dylin Ihnen D 11/27/2014,11:14 AM

## 2014-11-27 NOTE — Progress Notes (Signed)
[place] @ [facility] [phone]  [phone]     ONCOLOGY PROGRESS NOTE -   HISTORY OF PRESENT ILLNESS:  Continues to have generalized weakness, resting in bed but states The pain is under better control and he decided yesterday not to initiate Dilaudid PCA. He wants to try and sit in chair today. Denies any recurrent bleeding symptoms. No fever or chills. He wants to avoid taking Lovenox on other anticoagulated and is since he is concerned about bleeding risk from tumor. No nausea or vomiting. No diarrhea.   PAST MEDICAL HISTORY: Past Medical History  Diagnosis Date  . Allergy   . Cancer     tongue and neck  . DVT (deep venous thrombosis)     left arm and left leg    PAST SURGICAL HISTORY: Past Surgical History  Procedure Laterality Date  . Excision of tongue lesion with laser  08/13/11  . Neck dissection Bilateral 08/13/11  . Thoracotomy    . Peripheral vascular catheterization  10/27/2014    Procedure: PICC Line Insertion;  Surgeon: Katha Cabal, MD;  Location: [place] [facility];  Service: Cardiovascular;;     Allergies  Allergen Reactions  . Azithromycin Other (See Comments)    Pt states he gets real flushed red and sweaty.  . Penicillins Hives    Childhood reaction reported to patient by mother.  . Sulfa Antibiotics     Childhood reactions  . Erythromycin Base Swelling    Current Facility-Administered Medications  Medication Dose Route Frequency Provider Last Rate Last Dose  . acetaminophen (TYLENOL) tablet 650 mg  650 mg Oral Q4H PRN [name], MD      . dexamethasone (DECADRON) 16 mg in sodium chloride 0.9 % 50 mL IVPB  16 mg Intravenous Daily [name], MD   16 mg at 11/27/14 0953  . fentaNYL (DURAGESIC - dosed mcg/hr) 225 mcg  225 mcg Transdermal Q48H Srikar Sudini, MD      . HYDROmorphone (DILAUDID) injection 3 mg  3 mg Intravenous Q1H PRN [name], MD   3 mg at 11/27/14 1144  . HYDROmorphone (DILAUDID) tablet 6 mg  6  mg Oral QID Hillary Bow, MD   6 mg at 11/27/14 0955  . ketorolac (TORADOL) 15 MG/ML injection 15 mg  15 mg Intravenous Q12H [name], MD   15 mg at 11/27/14 0955  . levofloxacin (LEVAQUIN) IVPB 500 mg  500 mg Intravenous Q24H [name], MD   500 mg at 11/26/14 1814  . metroNIDAZOLE (FLAGYL) IVPB 500 mg  500 mg Intravenous Q8H Theodoro Grist, MD   500 mg at 11/27/14 0512  . polyethylene glycol (MIRALAX / GLYCOLAX) packet 17 g  17 g Oral Daily [name], MD   17 g at 11/27/14 0959  . pregabalin (LYRICA) capsule 200 mg  200 mg Oral BID Theodoro Grist, MD   200 mg at 11/27/14 0954  . senna-docusate (Senokot-S) tablet 2 tablet  2 tablet Oral BID [name], MD   2 tablet at 11/27/14 0955  . sodium hypochlorite (DAKIN'S 1/4 STRENGTH) topical solution   Irrigation Daily PRN Nestor Lewandowsky, MD      . vancomycin (VANCOCIN) 1,500 mg in sodium chloride 0.9 % 500 mL IVPB  1,500 mg Intravenous Q12H [name], MD   1,500 mg at 11/27/14 0954    OBJECTIVE:  Filed Vitals:   11/27/14 0700  BP: 118/61  Pulse:   Temp:   Resp: 14     There is no  weight on file to calculate BMI.    PHYSICAL EXAM: GENERAL: Patient is weak, resting in bed, otherwise alert and oriented and in no acute distress. There is no icterus. HEENT: Swelling over left neck/face. Drainage minimal. No obvious bleeding CVS: S1, S2 heard with regular rate and rhythm.   LUNGS: Bilaterally diminished breath sounds, no rhonchi ABDOMEN: Soft, nontender.  EXTREMITIES: LUE edema.   LAB RESULTS: Hemoglobin today 8.7. On 11/26/2014 - WBC 8.0, platelets 215, creatinine 0.47, calcium 7.9.  Admission on 11/24/2014  Component Date Value Ref Range Status  . WBC 11/25/2014 9.8  3.8 - 10.6 K/uL Final  . RBC 11/25/2014 2.77* 4.40 - 5.90 MIL/uL Final  . Hemoglobin 11/25/2014 9.2* 13.0 - 18.0 g/dL Final  . HCT 11/25/2014 27.9* 40.0 - 52.0 % Final  . MCV 11/25/2014 100.8* 80.0 - 100.0 fL Final  . MCH 11/25/2014 33.3  26.0 - 34.0 pg Final    . MCHC 11/25/2014 33.1  32.0 - 36.0 g/dL Final  . RDW 11/25/2014 21.7* 11.5 - 14.5 % Final  . Platelets 11/25/2014 228  150 - 440 K/uL Final  . Sodium 11/25/2014 135  135 - 145 mmol/L Final  . Potassium 11/25/2014 4.4  3.5 - 5.1 mmol/L Final  . Chloride 11/25/2014 99* 101 - 111 mmol/L Final  . CO2 11/25/2014 30  22 - 32 mmol/L Final  . Glucose, Bld 11/25/2014 139* 65 - 99 mg/dL Final  . BUN 11/25/2014 14  6 - 20 mg/dL Final  . Creatinine, Ser 11/25/2014 0.62  0.61 - 1.24 mg/dL Final  . Calcium 11/25/2014 8.2* 8.9 - 10.3 mg/dL Final  . Total Protein 11/25/2014 4.8* 6.5 - 8.1 g/dL Final  . Albumin 11/25/2014 1.9* 3.5 - 5.0 g/dL Final  . AST 11/25/2014 15  15 - 41 U/L Final  . ALT 11/25/2014 39  17 - 63 U/L Final  . Alkaline Phosphatase 11/25/2014 120  38 - 126 U/L Final  . Total Bilirubin 11/25/2014 0.6  0.3 - 1.2 mg/dL Final  . GFR calc non Af Amer 11/25/2014 >60  >60 mL/min Final  . GFR calc Af Amer 11/25/2014 >60  >60 mL/min Final   Comment: (NOTE) The eGFR has been calculated using the CKD EPI equation. This calculation has not been validated in all clinical situations. eGFR's persistently <60 mL/min signify possible Chronic Kidney Disease.   . Anion gap 11/25/2014 6  5 - 15 Final  . Specimen Description 11/24/2014 BLOOD RIGHT ASSIST CONTROL   Final  . Special Requests 11/24/2014 BOTTLES DRAWN AEROBIC AND ANAEROBIC 10ML   Final  . Culture 11/24/2014 NO GROWTH 3 DAYS   Final  . Report Status 11/24/2014 PENDING   Incomplete  . Specimen Description 11/24/2014 Blood   Final  . Special Requests 11/24/2014 NONE   Final  . Report Status 11/24/2014 11/26/2014 FINAL   Final  . Specimen Description 11/24/2014 Blood   Final  . Special Requests 11/24/2014 NONE   Final  . Report Status 11/24/2014 11/26/2014 FINAL   Final  . MRSA by PCR 11/24/2014 NEGATIVE  NEGATIVE Final  . Sodium 11/26/2014 137  135 - 145 mmol/L Final  . Potassium 11/26/2014 3.9  3.5 - 5.1 mmol/L Final  . Chloride  11/26/2014 104  101 - 111 mmol/L Final  . CO2 11/26/2014 28  22 - 32 mmol/L Final  . Glucose, Bld 11/26/2014 124* 65 - 99 mg/dL Final  . BUN 11/26/2014 15  6 - 20 mg/dL Final  . Creatinine, Ser 11/26/2014 0.47*  0.61 - 1.24 mg/dL Final  . Calcium 11/26/2014 7.9* 8.9 - 10.3 mg/dL Final  . GFR calc non Af Amer 11/26/2014 >60  >60 mL/min Final  . GFR calc Af Amer 11/26/2014 >60  >60 mL/min Final   Comment: (NOTE) The eGFR has been calculated using the CKD EPI equation. This calculation has not been validated in all clinical situations. eGFR's persistently <60 mL/min signify possible Chronic Kidney Disease.   . Anion gap 11/26/2014 5  5 - 15 Final  . WBC 11/26/2014 8.0  3.8 - 10.6 K/uL Final  . RBC 11/26/2014 2.57* 4.40 - 5.90 MIL/uL Final  . Hemoglobin 11/26/2014 8.6* 13.0 - 18.0 g/dL Final  . HCT 11/26/2014 26.1* 40.0 - 52.0 % Final  . MCV 11/26/2014 101.5* 80.0 - 100.0 fL Final  . MCH 11/26/2014 33.5  26.0 - 34.0 pg Final  . MCHC 11/26/2014 33.0  32.0 - 36.0 g/dL Final  . RDW 11/26/2014 22.7* 11.5 - 14.5 % Final  . Platelets 11/26/2014 215  150 - 440 K/uL Final  . Magnesium 11/26/2014 1.9  1.7 - 2.4 mg/dL Final  . Specimen Description 11/25/2014 BONE   Final  . Special Requests 11/25/2014 Immunocompromised   Final  . Gram Stain 11/25/2014    Final                   Value:FEW WBC SEEN MANY GRAM NEGATIVE RODS MANY GRAM POSITIVE COCCI   . Culture 11/25/2014    Final                   Value:LIGHT GROWTH GRAM NEGATIVE RODS IDENTIFICATION AND SUSCEPTIBILITIES TO FOLLOW   . Report Status 11/25/2014 PENDING   Incomplete  . Specimen Description 11/25/2014 BONE   Final  . Special Requests 11/25/2014 Immunocompromised   Final  . Gram Stain 11/25/2014 PENDING   Incomplete  . Culture 11/25/2014 HOLDING FOR POSSIBLE ANAEROBE   Final  . Report Status 11/25/2014 PENDING   Incomplete  . Vancomycin Tr 11/26/2014 17  10 - 20 ug/mL Final  . Hemoglobin 11/27/2014 8.7* 13.0 - 18.0 g/dL Final      ASSESSMENT: 35. 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 Continue fentanyl 200 g per hour patch, Dilaudid by mouth and IV when necessary, and monitor for pain control and for side effects. Continue broad-spectrum antibiotics, he is on levofloxacin and vancomycin. Overall prognosis is very poor, patient and family present at bedside are aware of this. 2. Left Upper Extremity DVT - swelling same, denies pain. He is off of anticoagulation given recent bleeding issues. No ongoing hemoptysis or other bleeding symptoms today, patient and wife present state that they would prefer to stay off of anticoagulation due to severe bleeding risk. 3. Anemia - hemoglobin stable at 8.7, likely from ongoing bleeding issues. Monitor and transfuse as indicated. 4. PT consult, try sit up in chair. 5. Code status - he wishes to remain a Limited Code.   Leia Alf, MD   11/27/2014 1:25 PM

## 2014-11-27 NOTE — Evaluation (Signed)
Physical Therapy Evaluation Patient Details Name: Corey Sims MRN: [id number] DOB: 11-Mar-1966 Today's Date: 11/27/2014   History of Present Illness  49 y/o male with tongue/throat cancer.  He has a would from treatment that started having more weeping and drainage as well as some increased pain.  Clinical Impression  Pt is able to ambulate well with no issues.  He does have some increased HR while walking, but does not have excessive fatigue and displays no safety concerns.    Follow Up Recommendations No PT follow up    Equipment Recommendations       Recommendations for Other Services       Precautions / Restrictions Restrictions Weight Bearing Restrictions: No      Mobility  Bed Mobility Overal bed mobility: Modified Independent             General bed mobility comments: Pt does not use L UE and has minimal difficulty time getting up from very soft bed  Transfers Overall transfer level: Independent Equipment used: None             General transfer comment: Pt able to rise w/o assist  Ambulation/Gait Ambulation/Gait assistance: Supervision Ambulation Distance (Feet): 75 Feet         General Gait Details: Once up pt does very well with ambulation with good confidence, appropirate speed and no losses of balance  Stairs            Wheelchair Mobility    Modified Rankin (Stroke Patients Only)       Balance                                             Pertinent Vitals/Pain      Home Living Family/patient expects to be discharged to:: Private residence Living Arrangements: Spouse/significant other;Children     Home Access: Stairs to enter   Technical brewer of Steps: 2          Prior Function Level of Independence: Independent         Comments: Pt coaches football, generally is able to be active though with his cancer treatnments he has been weak recently     Hand Dominance         Extremity/Trunk Assessment   Upper Extremity Assessment: Overall WFL for tasks assessed (L UE NT b/c of wound and severe pain with movement)           Lower Extremity Assessment: Overall WFL for tasks assessed         Communication   Communication: No difficulties  Cognition Arousal/Alertness: Awake/alert Behavior During Therapy: WFL for tasks assessed/performed Overall Cognitive Status: Within Functional Limits for tasks assessed                      General Comments      Exercises        Assessment/Plan    PT Assessment Patent does not need any further PT services  PT Diagnosis     PT Problem List    PT Treatment Interventions     PT Goals (Current goals can be found in the Care Plan section) Acute Rehab PT Goals Patient Stated Goal: "I want to get back home"    Frequency     Barriers to discharge        Co-evaluation  End of Session   Activity Tolerance: Patient tolerated treatment well Patient left: with nursing/sitter in room;in bed Nurse Communication: Mobility status         Time: 0131-4388 PT Time Calculation (min) (ACUTE ONLY): 27 min   Charges:   PT Evaluation $Initial PT Evaluation Tier I: 1 Procedure     PT G Codes:       Corey Sims, PT, DPT [phone]  Corey Sims 11/27/2014, 4:04 PM

## 2014-11-27 NOTE — Progress Notes (Signed)
[place] [facility] [place] NAME: Corey Sims    MR#:  [id number]  DATE OF BIRTH:  1966/03/07  SUBJECTIVE:  CHIEF COMPLAINT:  No chief complaint on file.  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 He was admitted to the hospital because of severe pain. He was noted to have bleeding from his wounds and Lovenox, which was given as a treatment of his left upper extremity DVT was stopped.  Patient still complains of significant pain. Maximum pain gets to 8/10. He says he feels better when it gets to 3/10. Needing IV dilaudid every 2-3 hrs. On Fentanyl 2110mcg and Dilaudid PO 4 mg every 4 hrs.  Review of Systems  Constitutional: Positive for malaise/fatigue. Negative for fever and chills.  HENT: Positive for congestion. Negative for sore throat.   Eyes: Negative for blurred vision, double vision and pain.  Respiratory: Positive for cough. Negative for hemoptysis, shortness of breath and wheezing.   Cardiovascular: Positive for chest pain. Negative for palpitations, orthopnea and leg swelling.  Gastrointestinal: Negative for heartburn, nausea, vomiting, abdominal pain, diarrhea and constipation.  Genitourinary: Negative for dysuria and hematuria.  Musculoskeletal: Positive for neck pain. Negative for back pain and joint pain.  Skin: Negative for rash.  Neurological: Positive for weakness. Negative for sensory change, speech change, focal weakness and headaches.  Endo/Heme/Allergies: Does not bruise/bleed easily.  Psychiatric/Behavioral: Positive for depression. The patient is not nervous/anxious.     VITAL SIGNS: Blood pressure  118/61, pulse 85, temperature 97.7 F (36.5 C), temperature source Oral, resp. rate 14, SpO2 93 %.  PHYSICAL EXAMINATION:   GENERAL:  49 y.o.-year-old patient lying in the bed due to severe distress due to pain is stiff , tense and stressed out.  EYES: Pupils equal, round, reactive to light and accommodation. No scleral icterus. Extraocular muscles intact.  HEENT: Head atraumatic, normocephalic. Oropharynx and nasopharynx clear. Unable to evaluate. Oral cavity due to significant pains. His left side of chest and neck is dressed with a ABD pads NECK:  Supple, no jugular venous distention. No thyroid enlargement, no tenderness.  LUNGS: Bilateral ronchi. Normal work of breathing CARDIOVASCULAR: S1, S2 normal. No murmurs, rubs, or gallops.  ABDOMEN: Soft, nontender, nondistended. Bowel sounds present. No organomegaly or mass.  EXTREMITIES: No pedal edema, cyanosis, or clubbing. Left upper extremity is swollen, very tender to touch and even superficial.  NEUROLOGIC: Cranial nerves II through XII are intact. Muscle strength 5/5 in all extremities. Sensation intact. Gait not checked.  PSYCHIATRIC: The patient is alert and oriented x 3.  SKIN: No obvious rash, lesion, or ulcer.   ORDERS/RESULTS REVIEWED:   CBC  Recent Labs Lab 11/22/14 1345 11/25/14 0428 11/26/14 0523 11/27/14 0606  WBC 10.7* 9.8 8.0  --   HGB 10.9* 9.2* 8.6* 8.7*  HCT 33.8* 27.9* 26.1*  --   PLT 279 228 215  --   MCV 100.3* 100.8* 101.5*  --   MCH 32.2 33.3 33.5  --   MCHC 32.1 33.1 33.0  --   RDW 21.6* 21.7* 22.7*  --   LYMPHSABS 0.4*  --   --   --   MONOABS 0.5  --   --   --  EOSABS 0.0  --   --   --   BASOSABS 0.0  --   --   --    ------------------------------------------------------------------------------------------------------------------  Chemistries   Recent Labs Lab 11/22/14 1345 11/25/14 0428 11/26/14 0523  NA 130* 135 137  K 3.5 4.4 3.9  CL 94* 99* 104  CO2 30 30 28   GLUCOSE 95 139* 124*   BUN 10 14 15   CREATININE 0.71 0.62 0.47*  CALCIUM 7.7* 8.2* 7.9*  MG  --   --  1.9  AST 17 15  --   ALT 71* 39  --   ALKPHOS 165* 120  --   BILITOT 0.5 0.6  --    ------------------------------------------------------------------------------------------------------------------ estimated creatinine clearance is 127.6 mL/min (by C-G formula based on Cr of 0.47). ------------------------------------------------------------------------------------------------------------------ No results for input(s): TSH, T4TOTAL, T3FREE, THYROIDAB in the last 72 hours.  Invalid input(s): FREET3  Cardiac Enzymes No results for input(s): CKMB, TROPONINI, MYOGLOBIN in the last 168 hours.  Invalid input(s): CK ------------------------------------------------------------------------------------------------------------------ Invalid input(s): POCBNP ---------------------------------------------------------------------------------------------------------------  RADIOLOGY: No results found.  EKG:  Orders placed or performed during the hospital encounter of 09/25/14  . ED EKG  . ED EKG  . EKG 12-Lead  . EKG 12-Lead  . EKG    ASSESSMENT AND PLAN:  Active Problems:   Intractable pain  1. Infected surgical wound , Neck and chest On IV abx. Wound cx have gram-negative rods and gram-positive cocci.. Waiting on final cultures. High risk for recurrent infections as he is immunocompromised and has open wounds.  2. Intractable pain, due to cancer, radiation, infection. This is a long-standing problem. Discussed with patient regarding coming up with the plan with fentanyl patch and oral pain medications instead of using IV. Increase fentanyl patch to 225 MCG. Increase oral Dilaudid to 6 mg every 4 hours. Continue IV Dilaudid as needed.  3. Hyponatremia, resolved with IV fluid administration  4. 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 Oliva Bustard Seems to have poor prognosis  overall. Oncology to discuss prognosis with the patient. On discussing with him he mentions that he understands there is no cure. He does say he wants to live as long as possible with good quality.  5. Left upper extremity DVT  Was on Lovenox. Held due to bleeding.  6. Wound hemorrhage with acute posthemorrhagic anemia, follow hemoglobin level daily and transfuse patient as needed  Management plans discussed with the patient, family and they are in agreement. I had a long discussion with patient with his wife at bedside regarding pain control. Changes made. We will wait for oncology to see the patient regarding plans with his cancer and discussing prognosis.   DRUG ALLERGIES:  Allergies  Allergen Reactions  . Azithromycin Other (See Comments)    Pt states he gets real flushed red and sweaty.  . Penicillins Hives    Childhood reaction reported to patient by mother.  . Sulfa Antibiotics     Childhood reactions  . Erythromycin Base Swelling    CODE STATUS:     Code Status Orders        Start     Ordered   11/25/14 1400  Limited resuscitation (code)   Continuous    Question Answer Comment  In the event of cardiac or respiratory ARREST: Initiate Code Blue, Call Rapid Response No   In the event of cardiac or respiratory ARREST: Perform CPR No   In the event of cardiac or respiratory ARREST: Perform Intubation/Mechanical Ventilation No  In the event of cardiac or respiratory ARREST: Use NIPPV/BiPAp only if indicated Yes   In the event of cardiac or respiratory ARREST: Administer ACLS medications if indicated Yes   In the event of cardiac or respiratory ARREST: Perform Defibrillation or Cardioversion if indicated No      11/25/14 1400      TOTAL  TIME TAKING CARE OF THIS PATIENT: 45 minutes.    Hillary Bow R M.D on 11/27/2014 at 11:04 AM  Between 7am to 6pm - Pager - [phone]  After 6pm go to [email or web address] - password [email or web address] [facility]  Office   [phone]  CC: Primary care physician; Crecencio Mc, MD

## 2014-11-28 LAB — CREATININE, SERUM
Creatinine, Ser: 0.54 mg/dL — ABNORMAL LOW (ref 0.61–1.24)
GFR calc Af Amer: >60 mL/min (ref >60)
GFR calc non Af Amer: >60 mL/min (ref >60)

## 2014-11-28 MED ORDER — HYDROMORPHONE HCL 2 MG PO TABS
4.0000 mg | ORAL_TABLET | ORAL | Status: DC
  Administered 2014-11-28 – 2014-11-29 (×5): 4 mg via ORAL
  Filled 2014-11-28 (×5): qty 2

## 2014-11-28 NOTE — Plan of Care (Addendum)
Problem: Discharge Progression Outcomes Goal: Other Discharge Outcomes/Goals Outcome: Progressing Pain in better control IV prn dilaudid x 2 today, dressing change completed this AM Patient tolerated well did have some pain during change.  Patient tolerating diet well, no other complaints.  Continues to get IV antibiotics and steroids.  Patient up in chair some this afternoon. No plans for discharge at this time.

## 2014-11-28 NOTE — Progress Notes (Signed)
Pt received to room 106 from ccu, oriented to room, pt without c/o pain at this time, wife at bedside, support given.

## 2014-11-28 NOTE — Plan of Care (Signed)
Problem: Discharge Progression Outcomes Goal: Discharge plan in place and appropriate Individualization of Care Pt prefers to be called Corey Sims Pt with History of 49 year old male with stage II (pT2 pN0 cM0) squamous cell carcinoma left tongue status post neck dissection partial glossectomy on 08/13/11  (2.5 x 2.0 cm moderately differentiated squamous cell carcinoma. Margins were clear but close at 5 mm. Depth of invasion was 0.9 cm. There was no lymph vascular spread there was positive perineural spread. 22 nodes were at excised all negative for metastatic disease).extensive history of chemotherapy and radiation. Pt has open wounds on neck and chest from tumor invasion Moderate Fall precautions. Goal: Other Discharge Outcomes/Goals Plan of Care Progress to Goal:  Pt in for intractable pain, dilaudid 6 mg continues on po schedule, pain controlled at present time, no prn's needed after transferred from ICU.  Pt and wife tearful at intervals r/t diagnosis and condition, support given.

## 2014-11-28 NOTE — Progress Notes (Signed)
[place] [facility] [place] NAME: Corey Sims    MR#:  [id number]  DATE OF BIRTH:  February 08, 1966  SUBJECTIVE:  CHIEF COMPLAINT:  No chief complaint on file.  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 He was admitted to the hospital because of severe pain. He was noted to have bleeding from his wounds and Lovenox, which was given as a treatment of his left upper extremity DVT was stopped.  Pain improved. He did need IV dilaudid on waking up today.  Dilaudid changed to Q6 hrs yesterday nightby on call physcian.  Review of Systems  Constitutional: Positive for malaise/fatigue. Negative for fever and chills.  HENT: Positive for congestion. Negative for sore throat.   Eyes: Negative for blurred vision, double vision and pain.  Respiratory: Positive for cough. Negative for hemoptysis, shortness of breath and wheezing.   Cardiovascular: Positive for chest pain. Negative for palpitations, orthopnea and leg swelling.  Gastrointestinal: Negative for heartburn, nausea, vomiting, abdominal pain, diarrhea and constipation.  Genitourinary: Negative for dysuria and hematuria.  Musculoskeletal: Positive for neck pain. Negative for back pain and joint pain.  Skin: Negative for rash.  Neurological: Positive for weakness. Negative for sensory change, speech change, focal weakness and headaches.  Endo/Heme/Allergies: Does not bruise/bleed easily.  Psychiatric/Behavioral: Positive for depression. The patient is not nervous/anxious.     VITAL SIGNS: Blood pressure 104/61, pulse 78, temperature 97.1 F (36.2 C), temperature source Axillary,  resp. rate 20, SpO2 91 %.  PHYSICAL EXAMINATION:   GENERAL:  49 y.o.-year-old patient lying in the bed due to severe distress due to pain is stiff , tense and stressed out.  EYES: Pupils equal, round, reactive to light and accommodation. No scleral icterus. Extraocular muscles intact.  HEENT: Head atraumatic, normocephalic. Oropharynx and nasopharynx clear. Unable to evaluate. Oral cavity due to significant pains. His left side of chest and neck is dressed with a ABD pads NECK:  Supple, no jugular venous distention. No thyroid enlargement, no tenderness.  LUNGS: Bilateral ronchi. Normal work of breathing CARDIOVASCULAR: S1, S2 normal. No murmurs, rubs, or gallops.  ABDOMEN: Soft, nontender, nondistended. Bowel sounds present. No organomegaly or mass.  EXTREMITIES: No pedal edema, cyanosis, or clubbing. Left upper extremity is swollen, very tender to touch and even superficial.  NEUROLOGIC: Cranial nerves II through XII are intact. Muscle strength 5/5 in all extremities. Sensation intact. Gait not checked.  PSYCHIATRIC: The patient is alert and oriented x 3.  SKIN: No obvious rash, lesion, or ulcer.   ORDERS/RESULTS REVIEWED:   CBC  Recent Labs Lab 11/22/14 1345 11/25/14 0428 11/26/14 0523 11/27/14 0606  WBC 10.7* 9.8 8.0  --   HGB 10.9* 9.2* 8.6* 8.7*  HCT 33.8* 27.9* 26.1*  --   PLT 279 228 215  --   MCV 100.3* 100.8* 101.5*  --   MCH 32.2 33.3 33.5  --   MCHC 32.1 33.1 33.0  --   RDW 21.6* 21.7* 22.7*  --   LYMPHSABS 0.4*  --   --   --   MONOABS 0.5  --   --   --   EOSABS 0.0  --   --   --  BASOSABS 0.0  --   --   --    ------------------------------------------------------------------------------------------------------------------  Chemistries   Recent Labs Lab 11/22/14 1345 11/25/14 0428 11/26/14 0523 11/28/14 0556  NA 130* 135 137  --   K 3.5 4.4 3.9  --   CL 94* 99* 104  --   CO2 30 30 28   --   GLUCOSE 95 139* 124*  --   BUN 10 14 15   --   CREATININE  0.71 0.62 0.47* 0.54*  CALCIUM 7.7* 8.2* 7.9*  --   MG  --   --  1.9  --   AST 17 15  --   --   ALT 71* 39  --   --   ALKPHOS 165* 120  --   --   BILITOT 0.5 0.6  --   --    ------------------------------------------------------------------------------------------------------------------ estimated creatinine clearance is 127.6 mL/min (by C-G formula based on Cr of 0.54). ------------------------------------------------------------------------------------------------------------------ No results for input(s): TSH, T4TOTAL, T3FREE, THYROIDAB in the last 72 hours.  Invalid input(s): FREET3  Cardiac Enzymes No results for input(s): CKMB, TROPONINI, MYOGLOBIN in the last 168 hours.  Invalid input(s): CK ------------------------------------------------------------------------------------------------------------------ Invalid input(s): POCBNP ---------------------------------------------------------------------------------------------------------------  RADIOLOGY: No results found.  EKG:  Orders placed or performed during the hospital encounter of 09/25/14  . ED EKG  . ED EKG  . EKG 12-Lead  . EKG 12-Lead  . EKG    ASSESSMENT AND PLAN:  Active Problems:   Intractable pain  1. Infected surgical wound , Neck and chest On IV abx. Wound cx have gram-negative rods and gram-positive cocci.. Waiting on final cultures. High risk for recurrent infections as he is immunocompromised and has open wounds.  2. Intractable pain, due to cancer, radiation, infection. This is a long-standing problem. Discussed with patient regarding coming up with the plan with fentanyl patch and oral pain medications instead of using IV. Patient and wife agree. Increase fentanyl patch to 225 MCG 11/27/2014. Change PO dilaudid to 4 mg Q4hr scheduled.  3. Hyponatremia, resolved with IV fluid administration  4. 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 Oliva Bustard Poor prognosis.  5. Left  upper extremity DVT  Was on Lovenox. Held due to bleeding.  6. Wound hemorrhage with acute posthemorrhagic anemia, follow hemoglobin level daily and transfuse patient as needed  Patient is tolerant to narcotics and needs high dose narcotics for his cancer induced pain.    DRUG ALLERGIES:  Allergies  Allergen Reactions  . Azithromycin Other (See Comments)    Pt states he gets real flushed red and sweaty.  . Penicillins Hives    Childhood reaction reported to patient by mother.  . Sulfa Antibiotics     Childhood reactions  . Erythromycin Base Swelling    CODE STATUS:     Code Status Orders        Start     Ordered   11/25/14 1400  Limited resuscitation (code)   Continuous    Question Answer Comment  In the event of cardiac or respiratory ARREST: Initiate Code Blue, Call Rapid Response No   In the event of cardiac or respiratory ARREST: Perform CPR No   In the event of cardiac or respiratory ARREST: Perform Intubation/Mechanical Ventilation No   In the event of cardiac or respiratory ARREST: Use NIPPV/BiPAp only if indicated Yes   In the event of cardiac or respiratory ARREST: Administer ACLS medications if indicated Yes   In the event of cardiac or respiratory ARREST: Perform  Defibrillation or Cardioversion if indicated No      11/25/14 1400      TOTAL  TIME TAKING CARE OF THIS PATIENT: 35 minutes.  > 50% time spent in co-ordinating care and discussing with patient and family at bedside   Hillary Bow R M.D on 11/28/2014 at 11:08 AM  Between 7am to 6pm - Pager - [phone]  After 6pm go to [email or web address] - password [email or web address] [facility]  Office  [phone]  CC: Primary care physician; Crecencio Mc, MD

## 2014-11-28 NOTE — Progress Notes (Addendum)
[place] @ [facility] Telephone:([phone]) [phone]  Fax:([phone]) [phone]     ONCOLOGY PROGRESS NOTE -   HISTORY OF PRESENT ILLNESS:  Patient resting in bed, states that overall weakness same. States that pain is under better control. Denies any recurrent bleeding symptoms. No fever or chills. He wants to avoid taking Lovenox on other anticoagulated and is since he is concerned about bleeding risk from tumor. No nausea or vomiting.   PAST MEDICAL HISTORY: Past Medical History  Diagnosis Date  . Allergy   . Cancer     tongue and neck  . DVT (deep venous thrombosis)     left arm and left leg    PAST SURGICAL HISTORY: Past Surgical History  Procedure Laterality Date  . Excision of tongue lesion with laser  08/13/11  . Neck dissection Bilateral 08/13/11  . Thoracotomy    . Peripheral vascular catheterization  10/27/2014    Procedure: PICC Line Insertion;  Surgeon: Katha Cabal, MD;  Location: [facility];  Service: Cardiovascular;;     Allergies  Allergen Reactions  . Azithromycin Other (See Comments)    Pt states he gets real flushed red and sweaty.  . Penicillins Hives    Childhood reaction reported to patient by mother.  . Sulfa Antibiotics     Childhood reactions  . Erythromycin Base Swelling    Current Facility-Administered Medications  Medication Dose Route Frequency Provider Last Rate Last Dose  . acetaminophen (TYLENOL) tablet 650 mg  650 mg Oral Q4H PRN [name], MD      . citalopram (CELEXA) tablet 20 mg  20 mg Oral Daily Leia Alf, MD   20 mg at 11/28/14 0945  . dexamethasone (DECADRON) 16 mg in sodium chloride 0.9 % 50 mL IVPB  16 mg Intravenous Daily [name], MD   16 mg at 11/28/14 0945  . fentaNYL (DURAGESIC - dosed mcg/hr) 225 mcg  225 mcg Transdermal Q48H Hillary Bow, MD   225 mcg at 11/27/14 2241  . HYDROmorphone (DILAUDID) injection 3 mg  3 mg Intravenous Q1H PRN [name], MD   3 mg at 11/28/14 0703  . HYDROmorphone (DILAUDID)  tablet 4 mg  4 mg Oral 6 times per day Hillary Bow, MD   4 mg at 11/28/14 1103  . ketorolac (TORADOL) 15 MG/ML injection 15 mg  15 mg Intravenous Q12H [name], MD   15 mg at 11/28/14 0945  . levofloxacin (LEVAQUIN) IVPB 500 mg  500 mg Intravenous Q24H [name], MD   500 mg at 11/27/14 1934  . metroNIDAZOLE (FLAGYL) IVPB 500 mg  500 mg Intravenous Q8H Theodoro Grist, MD   500 mg at 11/28/14 1324  . polyethylene glycol (MIRALAX / GLYCOLAX) packet 17 g  17 g Oral Daily [name], MD   17 g at 11/28/14 0945  . pregabalin (LYRICA) capsule 200 mg  200 mg Oral BID Theodoro Grist, MD   200 mg at 11/28/14 0945  . senna-docusate (Senokot-S) tablet 2 tablet  2 tablet Oral BID [name], MD   2 tablet at 11/28/14 0945  . sodium hypochlorite (DAKIN'S 1/4 STRENGTH) topical solution   Irrigation Daily PRN Nestor Lewandowsky, MD      . vancomycin (VANCOCIN) 1,500 mg in sodium chloride 0.9 % 500 mL IVPB  1,500 mg Intravenous Q12H [name], MD   1,500 mg at 11/28/14 0945    OBJECTIVE:  Filed Vitals:   11/28/14 1251  BP: 115/63  Pulse: 91  Temp: 98.4 F (36.9  C)  Resp:      There is no weight on file to calculate BMI.    PHYSICAL EXAM: GENERAL: still very weak, resting in bed, otherwise alert and oriented and in no acute distress. No icterus. HEENT: Swelling over left neck/face. Drainage from chest minimal, no obvious bleeding CVS: S1S2, regular r   LUNGS: Bilaterally diminished breath sounds, no rhonchi ABDOMEN: Soft, nontender.  EXTREMITIES: LUE edema.   LAB RESULTS: Hemoglobin yesterday 8.7. On 11/26/14 - WBC 8.0, platelets 215, creatinine 0.47, calcium 7.9.    ASSESSMENT: 1. Stage IV recurrent and progressive carcinoma of tongue, admitted with progressive disease and uncontrolled pain - clinically stable, patient states pain is under fairly good control and wants to continue on current opioid regimen. Continue fentanyl 200 g per hour patch, Dilaudid by mouth and IV when necessary,  and monitor for pain control and for side effects. Continue broad-spectrum antibiotics, he is on levofloxacin and vancomycin. Overall prognosis is very poor, patient and family present at bedside are aware of this. 2. Left Upper Extremity DVT - swelling same, denies pain. He is off of anticoagulation given recent bleeding issues. No ongoing hemoptysis or other bleeding symptoms today, patient and wife present state that they would prefer to stay off of anticoagulation due to severe bleeding risk. 3. Anemia - hemoglobin yesterday stable at 8.7, likely from ongoing bleeding issues. Monitor and transfuse as indicated. 4. Code status - he wishes to remain a Limited Code.   Leia Alf, MD   11/28/2014 3:53 PM

## 2014-11-29 ENCOUNTER — Inpatient Hospital Stay: Payer: BC Managed Care – PPO

## 2014-11-29 ENCOUNTER — Inpatient Hospital Stay: Payer: BC Managed Care – PPO | Admitting: Oncology

## 2014-11-29 DIAGNOSIS — R5383 Other fatigue: Secondary | ICD-10-CM

## 2014-11-29 LAB — WOUND CULTURE

## 2014-11-29 LAB — CULTURE, BLOOD (ROUTINE X 2): Culture: NO GROWTH

## 2014-11-29 MED ORDER — HYDROMORPHONE HCL 2 MG PO TABS
6.0000 mg | ORAL_TABLET | ORAL | Status: DC
  Administered 2014-11-29 – 2014-12-01 (×13): 6 mg via ORAL
  Filled 2014-11-29 (×14): qty 3

## 2014-11-29 MED ORDER — CITALOPRAM HYDROBROMIDE 20 MG PO TABS
40.0000 mg | ORAL_TABLET | Freq: Every day | ORAL | Status: DC
  Administered 2014-11-29 – 2014-12-01 (×3): 40 mg via ORAL
  Filled 2014-11-29 (×3): qty 2

## 2014-11-29 MED ORDER — DEXAMETHASONE 4 MG PO TABS
6.0000 mg | ORAL_TABLET | Freq: Two times a day (BID) | ORAL | Status: DC
  Administered 2014-11-29 – 2014-12-01 (×5): 6 mg via ORAL
  Filled 2014-11-29 (×5): qty 2

## 2014-11-29 NOTE — Plan of Care (Addendum)
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 Margins were clear but close at 5 mm. Depth of invasion was 0.9 cm. There was no lymph vascular spread there was positive perineural spread. 22 nodes were at excised all negative for metastatic disease).extensive history of chemotherapy and radiation. Pt has open wounds on neck and chest from tumor invasion.  Patient also has history of DVT in left arm and left leg. Moderate Fall precautions. Goal: Other Discharge Outcomes/Goals 1.  Pain:  Pain remains an ongoing issue.  Dilaudid PO given per physician's orders q4 hours.  Dilaudid IV given X3 this shift.  Patient rating pain 5-6/10.  Medications do provide relief and bring pain down to manageable levels. 2.  Patient hemodynamically stable this shift.  BP 111/65 mmHg  Pulse 92  Temp(Src) 97.4 F (36.3 C) (Oral)  Resp 17  Ht 6\' 1"  (1.854 m)  Wt 179 lb (81.194 kg)  BMI 23.62 kg/m2  SpO2 90%  3.  Complications: L. Chest dressing changed this shift due to copious amounts of sero-sanguinous drainage.  Patient tolerated well.  Patient continuing on IV antibiotics and steroids this shift. 4.  Diet:  Patient tolerating current diet. 5.  Activity:  Patient in bed this shift.   Stated that he was "tired and sore from being up in chair during the day shift".  Patient is able to reposition self but prefers to remain supine in high fowlers because it aids in pain management for his neck. 6.  No plans for discharge at this time.  Patient is anxious to get pain under control so that he can go home.

## 2014-11-29 NOTE — Consult Note (Signed)
WOC wound follow up In to see patient and wife today.  States the weekend overall was good.  Pain was worse when up in chair.  Anticipating discharge, unsure how pain will be managed.  Bedside nurse, Butch Penny in the room and was an excellent resource for information on this.  Wife provided with some silicone border foam dressings to use after discharge.  Will not follow at this time.  Please re-consult if needed.  Domenic Moras RN BSN [place] Pager [phone]

## 2014-11-29 NOTE — Progress Notes (Signed)
°   11/29/14 1200  Clinical Encounter Type  Visited With Patient and family together  Visit Type Initial (Spiritual Support)  Referral From Nurse  Spiritual Encounters  Spiritual Needs Prayer

## 2014-11-29 NOTE — Progress Notes (Signed)
Patient's v/s obtained per CNA, 02 sat 80%, patient had pain medications at [place], is in deep sleep, but will easily arouse to verbal stimuli, patient denies feeling short of breath, or discomfort from dyspnea, nurse applied 2 liters of 02 via [place], 02 sat increased to 90%. Wife is at bedside. Will continue to monitor.

## 2014-11-29 NOTE — Progress Notes (Signed)
[place] @ [facility] [phone]  Fax:([phone]) [place] OB: July 17, 1965  MR#: [id number]  OVZ#:[id number]  Patient Care Team: Crecencio Mc, MD as PCP - General (Internal Medicine) Algernon Huxley, MD as Consulting Physician (Vascular Surgery)  CHIEF COMPLAINT:  No chief complaint on file.   Oncology History   55. 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 Margins were clear but close at 5 mm. Depth of invasion was 0.9 cm. There was no lymph vascular spread there was positive perineural spread. 22 nodes were at excised all negative for metastatic disease). 2. Mediastinal/hilar Adenopathy on PET scan - EBUS procedure and biopsy at [facility] on 09/02/2011 negative for malignancy.  Also RLL lung biopsy at [facility] on 08/30/2011 negative for malignancy. 3. Patient had lower left cervical lymph node removed.  (Surgery was done at [facility]) followed by introperative radiation therapy.(May of 2014) 4  [place] in May of 2014 insufficient tissue 5. 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 6. Suspected Recurrent disease in the neck patient was started on cis-platinum, cetuximab and radiation therapy July17, 2014 7. Deep Vein  thromboses in the left lower extremity on Xaralto 8. Finished radiation and chemotherapy December 13, 2012 9. Abnormal CT scan and a PET scan.  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 Patient started on 5-FU , Cis-platinum, docetaxel from February 11, 2013 has finished total  5  cycles of chemotherapy. 10. Progressing disease.  Started on palliative radiation treatment with cetuximab.  (February, 201 5) 11. Finished radiation then cetuximab in April of 2015. 12. On carboplatinum and Taxol  and cetuximab 13 Started on  maintenance  cetuimab from January 21, 2014 14.progressive disease so started on  keytruda from March 15, 2014 15.ecent episode of thrombosis of left subclavian, left axillary veinpatient underwent thrombolytic therapy.  Removal of port.  (Because of infection)and started on Lovenox. 16.progressive disease on KEYTRUDA.  Palliative radiation therapy 17.palliative radiation therapy,march 201 18.  On IV methotrexate starting from April of 2016 19 progressive disease on methotrexate so patient was started on gemcitabine and vinorelbine from June of 2016     Primary cancer of tongue   08/26/2012 Initial Diagnosis Primary cancer of tongue    Oncology Flowsheet 11/19/2014 11/22/2014 11/24/2014 11/25/2014 11/26/2014 11/27/2014 11/28/2014  Day, Cycle - - - - - - -  dexamethasone (DECADRON) IV 10 _0  mg 16 mg  dexamethasone (DECADRON) PO - - - - - - -  enoxaparin (LOVENOX) [place] - - - - - - -  gemcitabine (GEMZAR) IV - - - - - - -  methotrexate (PF) IV - - - - - - -  methylPREDNISolone sodium succinate 125 mg/2 mL (SOLU-MEDROL) IM - - - - - - -  ondansetron (ZOFRAN) IV - - - - - - -  vinorelbine (NAVELBINE) IV - - - - - - -    INTERVAL HISTORY: 49 year old gentleman with carcinoma of head and neck stage IV disease. Patient was admitted in the hospital with intractable pain which is gradually getting better.  Feeling weak and tired.  Swelling of the left upper extremity.  Wound culture is pending As per HPI. Otherwise, a complete review of systems is negatve.  PAST MEDICAL HISTORY: Past Medical History  Diagnosis Date  . Allergy   . Cancer     tongue and neck  . DVT (deep venous thrombosis)     left arm and left leg    PAST SURGICAL HISTORY: Past Surgical History  Procedure Laterality Date  . Excision of tongue lesion with laser  08/13/11  . Neck dissection Bilateral 08/13/11  . Thoracotomy    . Peripheral vascular catheterization  10/27/2014    Procedure: PICC  Line Insertion;  Surgeon: Katha Cabal, MD;  Location: [facility];  Service: Cardiovascular;;    FAMILY HISTORY Family History  Problem Relation Age of Onset  . Cancer Father 27    prostate          ADVANCED DIRECTIVES: Advance care directive: Patient does have advanced healthcare directive and has been reviewed.  Patient does not want to change it at present time    HEALTH MAINTENANCE: History  Substance Use Topics  . Smoking status: Never Smoker   . Smokeless tobacco: Not on file  . Alcohol Use: No     Comment: very limited      Allergies  Allergen Reactions  . Azithromycin Other (See Comments)    Pt states he gets real flushed red and sweaty.  . Penicillins Hives    Childhood reaction reported to patient by mother.  . Sulfa Antibiotics     Childhood reactions  . Erythromycin Base Swelling    Current Facility-Administered Medications  Medication Dose Route Frequency Provider Last Rate Last Dose  . acetaminophen (TYLENOL) tablet 650 mg  650 mg Oral Q4H PRN [name], MD      . citalopram (CELEXA) tablet 40 mg  40 mg Oral Daily [name], MD      . dexamethasone (DECADRON) tablet 6 mg  6 mg Oral Q12H [name], MD      . fentaNYL ([place] - dosed mcg/hr) 225 mcg  225 mcg Transdermal Q48H Hillary Bow, MD   225 mcg at 11/27/14 2241  . HYDROmorphone (DILAUDID) injection 3 mg  3 mg Intravenous Q1H PRN [name], MD   3 mg at 11/28/14 2307  . HYDROmorphone (DILAUDID) tablet 6 mg  6 mg Oral 6 times per day [name], MD      . levofloxacin (LEVAQUIN) IVPB 500 mg  500 mg Intravenous Q24H [name], MD   500 mg at 11/28/14 2035  . metroNIDAZOLE (FLAGYL) IVPB 500 mg  500 mg Intravenous Q8H Theodoro Grist, MD   500 mg at 11/29/14 0445  . polyethylene glycol (MIRALAX / GLYCOLAX) packet 17 g  17 g Oral Daily [name], MD   17 g at 11/28/14 0945  . pregabalin (LYRICA) capsule 200 mg  200 mg Oral BID Theodoro Grist, MD   200 mg at 11/28/14  2242  . senna-docusate (Senokot-S) tablet 2 tablet  2 tablet Oral BID [name], MD   2 tablet at 11/28/14 2242  . sodium hypochlorite (DAKIN'S 1/4 STRENGTH) topical solution   Irrigation Daily PRN Nestor Lewandowsky, MD      . vancomycin (VANCOCIN) 1,500 mg in sodium chloride 0.9 % 500 mL IVPB  1,500 mg Intravenous Q12H [name], MD   1,500 mg at 11/28/14 2242    OBJECTIVE:  Filed Vitals:   11/29/14 0453  BP: 115/67  Pulse: 85  Temp: 97.8 F (36.6 C)  Resp: 20     Body mass index is 23.62 kg/(m^2).    ECOG FS:2 -  Symptomatic, <50% confined to bed  Review of system Gen. status: Performance status is declining. Increasing pain and discomfort  HEENT: Increasing swelling on the left side of the neck Increasing drainage Increasing swelling of the left side of the face  Severe pain throbbing  Swelling of left upper extremity Lungs: Increasing shortness of breath cough Cardiac: No chest pain.  No shortness of breath.  GI: Poor appetite and losing weight.  GU: No dysuria or hematuria.  Skin: No swelling.  No rash.  No ecchymoses.  Lower extremities no swelling.Neurological system: No dizziness.  No tingling.  His was.  no tingling numbness.  no focal weakness or any focal signs.\ Overall declining performance status  LAB RESULTS:  Admission on 11/24/2014  Component Date Value Ref Range Status  . WBC 11/25/2014 9.8  3.8 - 10.6 K/uL Final  . RBC 11/25/2014 2.77* 4.40 - 5.90 MIL/uL Final  . Hemoglobin 11/25/2014 9.2* 13.0 - 18.0 g/dL Final  . HCT 11/25/2014 27.9* 40.0 - 52.0 % Final  . MCV 11/25/2014 100.8* 80.0 - 100.0 fL Final  . MCH 11/25/2014 33.3  26.0 - 34.0 pg Final  . MCHC 11/25/2014 33.1  32.0 - 36.0 g/dL Final  . RDW 11/25/2014 21.7* 11.5 - 14.5 % Final  . Platelets 11/25/2014 228  150 - 440 K/uL Final  . Sodium 11/25/2014 135  135 - 145 mmol/L Final  . Potassium 11/25/2014 4.4  3.5 - 5.1 mmol/L Final  . Chloride 11/25/2014 99* 101 - 111 mmol/L Final  . CO2 11/25/2014 30   22 - 32 mmol/L Final  . Glucose, Bld 11/25/2014 139* 65 - 99 mg/dL Final  . BUN 11/25/2014 14  6 - 20 mg/dL Final  . Creatinine, Ser 11/25/2014 0.62  0.61 - 1.24 mg/dL Final  . Calcium 11/25/2014 8.2* 8.9 - 10.3 mg/dL Final  . Total Protein 11/25/2014 4.8* 6.5 - 8.1 g/dL Final  . Albumin 11/25/2014 1.9* 3.5 - 5.0 g/dL Final  . AST 11/25/2014 15  15 - 41 U/L Final  . ALT 11/25/2014 39  17 - 63 U/L Final  . Alkaline Phosphatase 11/25/2014 120  38 - 126 U/L Final  . Total Bilirubin 11/25/2014 0.6  0.3 - 1.2 mg/dL Final  . GFR calc non Af Amer 11/25/2014 >60  >60 mL/min Final  . GFR calc Af Amer 11/25/2014 >60  >60 mL/min Final   Comment: (NOTE) The eGFR has been calculated using the CKD EPI equation. This calculation has not been validated in all clinical situations. eGFR's persistently <60 mL/min signify possible Chronic Kidney Disease.   . Anion gap 11/25/2014 6  5 - 15 Final  . Specimen Description 11/24/2014 BLOOD RIGHT ASSIST CONTROL   Final  . Special Requests 11/24/2014 BOTTLES DRAWN AEROBIC AND ANAEROBIC 10ML   Final  . Culture 11/24/2014 NO GROWTH 5 DAYS   Final  . Report Status 11/24/2014 11/29/2014 FINAL   Final  . Specimen Description 11/24/2014 Blood   Final  . Special Requests 11/24/2014 NONE   Final  . Report Status 11/24/2014 11/26/2014 FINAL   Final  . Specimen Description 11/24/2014 Blood   Final  . Special Requests 11/24/2014 NONE   Final  . Report Status 11/24/2014 11/26/2014 FINAL   Final  . MRSA by PCR 11/24/2014 NEGATIVE  NEGATIVE Final  . Sodium 11/26/2014 137  135 - 145 mmol/L Final  . Potassium 11/26/2014 3.9  3.5 - 5.1 mmol/L Final  . Chloride 11/26/2014 104  101 - 111 mmol/L Final  .  CO2 11/26/2014 28  22 - 32 mmol/L Final  . Glucose, Bld 11/26/2014 124* 65 - 99 mg/dL Final  . BUN 11/26/2014 15  6 - 20 mg/dL Final  . Creatinine, Ser 11/26/2014 0.47* 0.61 - 1.24 mg/dL Final  . Calcium 11/26/2014 7.9* 8.9 - 10.3 mg/dL Final  . GFR calc non Af Amer  11/26/2014 >60  >60 mL/min Final  . GFR calc Af Amer 11/26/2014 >60  >60 mL/min Final   Comment: (NOTE) The eGFR has been calculated using the CKD EPI equation. This calculation has not been validated in all clinical situations. eGFR's persistently <60 mL/min signify possible Chronic Kidney Disease.   . Anion gap 11/26/2014 5  5 - 15 Final  . WBC 11/26/2014 8.0  3.8 - 10.6 K/uL Final  . RBC 11/26/2014 2.57* 4.40 - 5.90 MIL/uL Final  . Hemoglobin 11/26/2014 8.6* 13.0 - 18.0 g/dL Final  . HCT 11/26/2014 26.1* 40.0 - 52.0 % Final  . MCV 11/26/2014 101.5* 80.0 - 100.0 fL Final  . MCH 11/26/2014 33.5  26.0 - 34.0 pg Final  . MCHC 11/26/2014 33.0  32.0 - 36.0 g/dL Final  . RDW 11/26/2014 22.7* 11.5 - 14.5 % Final  . Platelets 11/26/2014 215  150 - 440 K/uL Final  . Magnesium 11/26/2014 1.9  1.7 - 2.4 mg/dL Final  . Specimen Description 11/25/2014 BONE   Final  . Special Requests 11/25/2014 Immunocompromised   Final  . Gram Stain 11/25/2014    Final                   Value:FEW WBC SEEN MANY GRAM NEGATIVE RODS MANY GRAM POSITIVE COCCI   . Culture 11/25/2014    Final                   Value:LIGHT GROWTH GRAM NEGATIVE RODS IDENTIFICATION AND SUSCEPTIBILITIES TO FOLLOW REPEATING TO CONFIRM ID   . Report Status 11/25/2014 PENDING   Incomplete  . Specimen Description 11/25/2014 BONE   Final  . Special Requests 11/25/2014 Immunocompromised   Final  . Culture 11/25/2014 HOLDING FOR POSSIBLE ANAEROBE   Final  . Report Status 11/25/2014 PENDING   Incomplete  . Vancomycin Tr 11/26/2014 17  10 - 20 ug/mL Final  . Hemoglobin 11/27/2014 8.7* 13.0 - 18.0 g/dL Final  . Creatinine, Ser 11/28/2014 0.54* 0.61 - 1.24 mg/dL Final  . GFR calc non Af Amer 11/28/2014 >60  >60 mL/min Final  . GFR calc Af Amer 11/28/2014 >60  >60 mL/min Final   Comment: (NOTE) The eGFR has been calculated using the CKD EPI equation. This calculation has not been validated in all clinical situations. eGFR's persistently  <60 mL/min signify possible Chronic Kidney Disease.      ASSESSMENT: 49 year old gentleman with stage IV and recurrent and progressive carcinoma of tongue Wound culture shows gram-negative organisms and aerobic culture is pending. On Levaquin, Flagyl, vancomycin 2.  Intractable pain Gradually getting better will switch to oral Dilaudid to 6 mg.  Oral Decadron.  Discontinue IV Decadron.  We will discontinue vancomycin.  Pending culture Consider possibility of discharge tomorrow 3.  Left upper extremity swelling Progressing cancer  Limited code   Continue Toradol, Decadron, intravenous Dilaudid and Dilaudid by mouth       Palliative care options versus further chemotherapy with cis-platinum and 5-FU.  And or vectibix or cetuximab.    Primary cancer of tongue   Staging form: Lip and Oral Cavity, AJCC 7th Edition     Clinical: Stage  IVC (T2, N2, M1) - Unsigned   [name], MD   11/29/2014 8:41 AM

## 2014-11-29 NOTE — Plan of Care (Signed)
Problem: Pain Management Goal: Pain control Patient will demonstrate personal actions to control pain. Outcome: Progressing Pain meds have been given as scheduled and PRN.  Pt does fall asleep after IV Dilaudid.  PRN pain meds given priro to dressing change.

## 2014-11-29 NOTE — Progress Notes (Signed)
..   11/29/2014 12:39 PM  Corey Sims [id number]    Temp:  [97.4 F (36.3 C)-98.4 F (36.9 C)] 97.8 F (36.6 C) (07/18 0453) Pulse Rate:  [85-92] 85 (07/18 0453) Resp:  [17-20] 20 (07/18 0453) BP: (111-115)/(63-67) 115/67 mmHg (07/18 0453) SpO2:  [90 %] 90 % (07/18 0453) Weight:  [81.194 kg (179 lb)] 81.194 kg (179 lb) (07/17 1700),     Intake/Output Summary (Last 24 hours) at 11/29/14 1239 Last data filed at 11/28/14 1800  Gross per 24 hour  Intake    240 ml  Output      0 ml  Net    240 ml    No results found for this or any previous visit (from the past 24 hour(s)).  Culture results:  ACALIGENES FAECALIS- resistant to Levaquin, sensitive to Ceftriaxone  SUBJECTIVE:  Pain continues to be an issue but decreased from admission, continued drainage from wound.  Culture results back.  OBJECTIVE:  Would dressing removed and changed with nurse.  Increase in size of sternal wound, serous drainage with less of an odor today than previous.  Continued pieces of necrotic bone removed from wound on packing and packing changed.  50ccs of fluid suctioned from sternal wound.  Improved fibrotic tissue at base.  No evidence of active bleeding.    IMPRESSION:  Recurrent SCCA of oral cavity/neck with open sternal/neck wounds  PLAN:  Continue dressing changes per wound care rec's.  Defer to Heme/Onc regarding to chemotherapy and pain.  Consider adjusting antibiotics as resistant to levaquin but sensitive to Ceftriaxone.  Will continue to follow while inpatient.  [name] 11/29/2014, 12:39 PM

## 2014-11-29 NOTE — Progress Notes (Signed)
[place] [facility] [place] NAME: Corey Sims    MR#:  [id number]  DATE OF BIRTH:  1965-12-27  SUBJECTIVE:  CHIEF COMPLAINT:  No chief complaint on file.  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 He was admitted to the hospital because of severe pain. He was noted to have bleeding from his wounds and Lovenox, which was given as a treatment of his left upper extremity DVT was stopped.  Pain improved. He did need IV dilaudid on waking up today.   Review of Systems  Constitutional: Positive for malaise/fatigue. Negative for fever and chills.  HENT: Positive for congestion. Negative for sore throat.   Eyes: Negative for blurred vision, double vision and pain.  Respiratory: Positive for cough. Negative for hemoptysis, shortness of breath and wheezing.   Cardiovascular: Positive for chest pain. Negative for palpitations, orthopnea and leg swelling.  Gastrointestinal: Negative for heartburn, nausea, vomiting, abdominal pain, diarrhea and constipation.  Genitourinary: Negative for dysuria and hematuria.  Musculoskeletal: Positive for neck pain. Negative for back pain and joint pain.  Skin: Negative for rash.  Neurological: Positive for weakness. Negative for sensory change, speech change, focal weakness and headaches.  Endo/Heme/Allergies: Does not bruise/bleed easily.  Psychiatric/Behavioral: Positive for depression. The patient is not nervous/anxious.     VITAL SIGNS: Blood pressure 115/67, pulse 85, temperature 97.8 F (36.6 C), temperature source Oral, resp. rate 20, height 6\' 1"  (1.854 m), weight 81.194 kg (179 lb), SpO2  90 %.  PHYSICAL EXAMINATION:   GENERAL:  49 y.o.-year-old patient lying in the bed due to severe distress due to pain is stiff , tense and stressed out.  EYES: Pupils equal, round, reactive to light and accommodation. No scleral icterus. Extraocular muscles intact.  HEENT: Head atraumatic, normocephalic. Oropharynx and nasopharynx clear. Unable to evaluate. Oral cavity due to significant pains. His left side of chest and neck is dressed with a ABD pads NECK:  Supple, no jugular venous distention. No thyroid enlargement, no tenderness.  LUNGS: Bilateral ronchi. Normal work of breathing CARDIOVASCULAR: S1, S2 normal. No murmurs, rubs, or gallops.  ABDOMEN: Soft, nontender, nondistended. Bowel sounds present. No organomegaly or mass.  EXTREMITIES: No pedal edema, cyanosis, or clubbing. Left upper extremity is swollen, very tender to touch and even superficial.  NEUROLOGIC: Cranial nerves II through XII are intact. Muscle strength 5/5 in all extremities. Sensation intact. Gait not checked.  PSYCHIATRIC: The patient is alert and oriented x 3.  SKIN: No obvious rash, lesion, or ulcer.   ORDERS/RESULTS REVIEWED:   CBC  Recent Labs Lab 11/22/14 1345 11/25/14 0428 11/26/14 0523 11/27/14 0606  WBC 10.7* 9.8 8.0  --   HGB 10.9* 9.2* 8.6* 8.7*  HCT 33.8* 27.9* 26.1*  --   PLT 279 228 215  --   MCV 100.3* 100.8* 101.5*  --   MCH 32.2 33.3 33.5  --   MCHC 32.1 33.1 33.0  --   RDW 21.6* 21.7* 22.7*  --   LYMPHSABS 0.4*  --   --   --   MONOABS 0.5  --   --   --   EOSABS 0.0  --   --   --  BASOSABS 0.0  --   --   --    ------------------------------------------------------------------------------------------------------------------  Chemistries   Recent Labs Lab 11/22/14 1345 11/25/14 0428 11/26/14 0523 11/28/14 0556  NA 130* 135 137  --   K 3.5 4.4 3.9  --   CL 94* 99* 104  --   CO2 30 30 28   --   GLUCOSE 95 139* 124*  --   BUN 10 14 15   --   CREATININE 0.71 0.62 0.47* 0.54*   CALCIUM 7.7* 8.2* 7.9*  --   MG  --   --  1.9  --   AST 17 15  --   --   ALT 71* 39  --   --   ALKPHOS 165* 120  --   --   BILITOT 0.5 0.6  --   --    ------------------------------------------------------------------------------------------------------------------ estimated creatinine clearance is 127.6 mL/min (by C-G formula based on Cr of 0.54). ------------------------------------------------------------------------------------------------------------------ No results for input(s): TSH, T4TOTAL, T3FREE, THYROIDAB in the last 72 hours.  Invalid input(s): FREET3  Cardiac Enzymes No results for input(s): CKMB, TROPONINI, MYOGLOBIN in the last 168 hours.  Invalid input(s): CK ------------------------------------------------------------------------------------------------------------------ Invalid input(s): POCBNP ---------------------------------------------------------------------------------------------------------------  RADIOLOGY: No results found.  EKG:  Orders placed or performed during the hospital encounter of 09/25/14  . ED EKG  . ED EKG  . EKG 12-Lead  . EKG 12-Lead  . EKG    ASSESSMENT AND PLAN:  Active Problems:   Intractable pain  1. Infected surgical wound , Neck and chest On IV abx. Wound cx have gram-negative rods and gram-positive cocci.. Waiting on final cultures. High risk for recurrent infections as he is immunocompromised and has open wounds. Vancomycin stopped.  2. Intractable pain, due to cancer, radiation, infection. On fentanyl 225 mcg Dilaudid 6mg  Q 4hrs.  3. Hyponatremia, resolved with IV fluid administration  4. 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 Oliva Bustard Poor prognosis.  5. Left upper extremity DVT  Was on Lovenox. Held due to bleeding.  6. Wound hemorrhage with acute posthemorrhagic anemia, follow hemoglobin level daily and transfuse patient as needed  Patient is tolerant to narcotics and needs high  dose narcotics for his cancer induced pain.  Likely discharge in AM  I will sign off at this point. Please call if i can be of further help.    DRUG ALLERGIES:  Allergies  Allergen Reactions  . Azithromycin Other (See Comments)    Pt states he gets real flushed red and sweaty.  . Penicillins Hives    Childhood reaction reported to patient by mother.  . Sulfa Antibiotics     Childhood reactions  . Erythromycin Base Swelling    CODE STATUS:     Code Status Orders        Start     Ordered   11/25/14 1400  Limited resuscitation (code)   Continuous    Question Answer Comment  In the event of cardiac or respiratory ARREST: Initiate Code Blue, Call Rapid Response No   In the event of cardiac or respiratory ARREST: Perform CPR No   In the event of cardiac or respiratory ARREST: Perform Intubation/Mechanical Ventilation No   In the event of cardiac or respiratory ARREST: Use NIPPV/BiPAp only if indicated Yes   In the event of cardiac or respiratory ARREST: Administer ACLS medications if indicated Yes   In the event of cardiac or respiratory ARREST: Perform Defibrillation or Cardioversion if indicated No      11/25/14  1400      TOTAL  TIME TAKING CARE OF THIS PATIENT: 35 minutes.  > 50% time spent in co-ordinating care and discussing with patient and family at bedside   Hillary Bow R M.D on 11/29/2014 at 12:41 PM  Between 7am to 6pm - Pager - [phone]  After 6pm go to [email or web address] - password [email or web address] [facility]  Office  [phone]  CC: Primary care physician; Crecencio Mc, MD

## 2014-11-29 NOTE — Progress Notes (Signed)
Nutrition Follow-up       INTERVENTION:   Meals and snacks: Cater to pt preferences; wife has regular menu. Continue sweet tea and pudding snack at this time called kitchen to verify order in place  NUTRITION DIAGNOSIS:   Increased nutrient needs related to cancer and cancer related treatments as evidenced by estimated needs, intake improving.    GOAL:   Patient will meet greater than or equal to 90% of their needs    MONITOR:    (Energy Intake, Anthropometrics, Electrolyte/Renal Profile, Digestive System)  REASON FOR ASSESSMENT:   Diagnosis    ASSESSMENT:   Pt admitted with intractable pain, infection; pt metastatic squamous cell cancer of the tongue, open wounds in neck and chest area   Current Nutrition: eating 80-90% of meals per pt and wife at bedside.  Enjoying hospital foods.     Last BM: 7/16   Medications: reviewed  Electrolyte/Renal Profile and Glucose Profile:   Recent Labs Lab 11/22/14 1345 11/25/14 0428 11/26/14 0523 11/28/14 0556  NA 130* 135 137  --   K 3.5 4.4 3.9  --   CL 94* 99* 104  --   CO2 30 30 28   --   BUN 10 14 15   --   CREATININE 0.71 0.62 0.47* 0.54*  CALCIUM 7.7* 8.2* 7.9*  --   MG  --   --  1.9  --   GLUCOSE 95 139* 124*  --    Protein Profile:  Recent Labs Lab 11/22/14 1345 11/25/14 0428  ALBUMIN 2.4* 1.9*      Weight Trend since Admission: Filed Weights   11/28/14 1700  Weight: 179 lb (81.194 kg)      Diet Order:  Diet regular Room service appropriate?: Yes; Fluid consistency:: Thin  Skin:   (open wounds on neck and chest)   Height:   Ht Readings from Last 1 Encounters:  11/28/14 6\' 1"  (1.854 m)    Weight:   Wt Readings from Last 1 Encounters:  11/28/14 179 lb (81.194 kg)      BMI:  Body mass index is 23.62 kg/(m^2).  Estimated Nutritional Needs:   Kcal:  2993-7169 kcals (BEE 1731, 1.3 AF, 1.1-1.3 IF)   Protein:  97-113 g (1.2-1.4 g/kg)   Fluid:  2025-2430 mL (25-30 ml/kg)    EDUCATION NEEDS:   No education needs identified at this time  LOW Care Level Allyana Vogan B. Zenia Resides, [place], [place] (pager)

## 2014-11-29 NOTE — Progress Notes (Signed)
ANTIBIOTIC CONSULT NOTE - FOLLOW UP  Pharmacy Consult for Vancomycin Dosing Indication: cellulitis  Allergies  Allergen Reactions  . Azithromycin Other (See Comments)    Pt states he gets real flushed red and sweaty.  . Penicillins Hives    Childhood reaction reported to patient by mother.  . Sulfa Antibiotics     Childhood reactions  . Erythromycin Base Swelling    Patient Measurements:   Vital Signs: Temp: 97.8 F (36.6 C) (07/18 0453) Temp Source: Oral (07/18 0453) BP: 115/67 mmHg (07/18 0453) Pulse Rate: 85 (07/18 0453) Intake/Output from previous day: 07/17 0701 - 07/18 0700 In: 480 [P.O.:480] Out: 750 [Urine:750] Intake/Output from this shift:    Labs:  Recent Labs  11/27/14 0606 11/28/14 0556  HGB 8.7*  --   CREATININE  --  0.54*   Estimated Creatinine Clearance: 127.6 mL/min (by C-G formula based on Cr of 0.54).  Recent Labs  11/26/14 2212  [place]     Microbiology: Recent Results (from the past 720 hour(s))  MRSA PCR Screening     Status: None   Collection Time: 11/24/14  6:22 PM  Result Value Ref Range Status   MRSA by PCR NEGATIVE NEGATIVE Final  Culture, blood (routine x 2)     Status: None   Collection Time: 11/24/14  6:52 PM  Result Value Ref Range Status   Specimen Description BLOOD RIGHT ASSIST CONTROL  Final   Special Requests BOTTLES DRAWN AEROBIC AND ANAEROBIC 10ML  Final   Culture NO GROWTH 5 DAYS  Final   Report Status 11/29/2014 FINAL  Final  Culture, blood (routine x 2)     Status: None   Collection Time: 11/24/14  6:52 PM  Result Value Ref Range Status   Specimen Description Blood  Final   Special Requests NONE  Final   Report Status 11/26/2014 FINAL  Final  Culture, blood (routine x 2)     Status: None   Collection Time: 11/24/14  6:52 PM  Result Value Ref Range Status   Specimen Description Blood  Final   Special Requests NONE  Final   Report Status 11/26/2014 FINAL  Final  Wound culture     Status: None  (Preliminary result)   Collection Time: 11/25/14  6:20 PM  Result Value Ref Range Status   Specimen Description BONE  Final   Special Requests Immunocompromised  Final   Gram Stain   Final    FEW WBC SEEN MANY GRAM NEGATIVE RODS MANY GRAM POSITIVE COCCI    Culture   Final    LIGHT GROWTH GRAM NEGATIVE RODS IDENTIFICATION AND SUSCEPTIBILITIES TO FOLLOW REPEATING TO CONFIRM ID    Report Status PENDING  Incomplete  Anaerobic culture     Status: None (Preliminary result)   Collection Time: 11/25/14  6:20 PM  Result Value Ref Range Status   Specimen Description BONE  Final   Special Requests Immunocompromised  Final   Culture HOLDING FOR POSSIBLE ANAEROBE  Final   Report Status PENDING  Incomplete    Anti-infectives    Start     Dose/Rate Route Frequency Ordered Stop   11/26/14 1000  metroNIDAZOLE (FLAGYL) IVPB 500 mg     500 mg 100 mL/hr over 60 Minutes Intravenous Every 8 hours 11/26/14 0945     11/24/14 2200  vancomycin (VANCOCIN) 1,500 mg in sodium chloride 0.9 % 500 mL IVPB     1,500 mg 250 mL/hr over 120 Minutes Intravenous Every 12 hours 11/24/14 2128  11/24/14 1900  levofloxacin (LEVAQUIN) IVPB 500 mg     500 mg 100 mL/hr over 60 Minutes Intravenous Every 24 hours 11/24/14 1828     11/24/14 1830  vancomycin (VANCOCIN) IVPB 1000 mg/200 mL premix  Status:  Discontinued     1,000 mg 200 mL/hr over 60 Minutes Intravenous  Once 11/24/14 1828 11/24/14 2128       Assessment: Patient with progressing carcinoma base of tongue. Failing multiple chemotherapy program and radiation and surgical program in the past with intractable pain not controlled with oral pain medication. Patient being treated with vancomycin (day 6), levofloxacin (day 6), and metronidazole (day 5) neck cellulitis/wound infection  Wound culture/bone growing GPC and GNR.  Unclear if possible treatment of osteomyelitis.  Therefore, will make goal trough 15-20.  Trough level of 17 at 2212 on 7/15.  Goal  of Therapy:  Vancomycin trough level 15-20  Plan:  Trough 7/15 therapeutic, renal function stable. Will continue current orders for vancomycin 1500mg  IV Q12H.    Will recheck trough as clinically indication.  Follow SCr.  Follow up cultures.   Pharmacy will continue to monitor and adjust per consult.    Rexene Edison, PharmD Clinical Pharmacist  11/29/2014,8:48 AM

## 2014-11-29 NOTE — Plan of Care (Signed)
Problem: General Medical Path Diagnosis Goal: Patient's pain/physical discomfort will be reduced to the level of patient's stated goal Outcome: Progressing Progress toward goals:  Wound:  Per family and Dr Pryor Ochoa the drainage has decreased.  Still malodorous.  Noted to have bone fragments when old dressing removed from chest wound. Dr Pryor Ochoa suctioned approximately 50 ml of drainage from chest wound. Pain: as noted in additional problems pain management. Pt does miinimal movement secondary to pain.

## 2014-11-30 DIAGNOSIS — R0902 Hypoxemia: Secondary | ICD-10-CM

## 2014-11-30 DIAGNOSIS — A499 Bacterial infection, unspecified: Secondary | ICD-10-CM

## 2014-11-30 LAB — CBC WITH DIFFERENTIAL/PLATELET
BAND NEUTROPHILS: 14 % — AB (ref 0–10)
BASOS ABS: 0 10*3/uL (ref 0.0–0.1)
BASOS PCT: 0 % (ref 0–1)
BLASTS: 0 %
EOS PCT: 0 % (ref 0–5)
Eosinophils Absolute: 0 10*3/uL (ref 0.0–0.7)
HEMATOCRIT: 31.3 % — AB (ref 40.0–52.0)
Hemoglobin: 10 g/dL — ABNORMAL LOW (ref 13.0–18.0)
Lymphocytes Relative: 2 % — ABNORMAL LOW (ref 12–46)
Lymphs Abs: 0.3 10*3/uL — ABNORMAL LOW (ref 0.7–4.0)
MCH: 32.7 pg (ref 26.0–34.0)
MCHC: 31.8 g/dL — ABNORMAL LOW (ref 32.0–36.0)
MCV: 102.9 fL — ABNORMAL HIGH (ref 80.0–100.0)
MONO ABS: 0.8 10*3/uL (ref 0.1–1.0)
MONOS PCT: 6 % (ref 3–12)
MYELOCYTES: 0 %
Metamyelocytes Relative: 3 %
NEUTROS PCT: 75 % (ref 43–77)
NRBC: 1 /100{WBCs} — AB
Neutro Abs: 12.1 10*3/uL — ABNORMAL HIGH (ref 1.7–7.7)
Other: 0 %
PLATELETS: 220 10*3/uL (ref 150–440)
PROMYELOCYTES ABS: 0 %
RBC: 3.05 MIL/uL — AB (ref 4.40–5.90)
RDW: 22.8 % — ABNORMAL HIGH (ref 11.5–14.5)
WBC: 13.2 10*3/uL — ABNORMAL HIGH (ref 3.8–10.6)

## 2014-11-30 LAB — CREATININE, SERUM
CREATININE: 0.67 mg/dL (ref 0.61–1.24)
GFR calc non Af Amer: >60 mL/min (ref >60)

## 2014-11-30 MED ORDER — GUAIFENESIN ER 600 MG PO TB12
600.0000 mg | ORAL_TABLET | Freq: Two times a day (BID) | ORAL | Status: DC
  Administered 2014-11-30 – 2014-12-01 (×3): 600 mg via ORAL
  Filled 2014-11-30 (×3): qty 1

## 2014-11-30 MED ORDER — CEFTRIAXONE SODIUM IN DEXTROSE 40 MG/ML IV SOLN: 2.0000 g | Freq: Once | INTRAVENOUS | Status: DC

## 2014-11-30 MED ORDER — CEFTRIAXONE SODIUM 2 G IJ SOLR
2.0000 g | Freq: Once | INTRAMUSCULAR | Status: AC
  Administered 2014-11-30: 16:00:00 2 g via INTRAVENOUS
  Filled 2014-11-30: qty 2

## 2014-11-30 MED ORDER — SODIUM CHLORIDE 0.9 % IJ SOLN
10.0000 mL | INTRAMUSCULAR | Status: DC | PRN
  Administered 2014-11-30: 10 mL via INTRAVENOUS
  Filled 2014-11-30: qty 10

## 2014-11-30 NOTE — Care Management (Addendum)
Discussed discharge plans with Dr. Jeb Levering this morning. May need hospital bed in the home. Spoke with Mr & Ms Carras at the bedside. Discussed hospital bed, wheelchair, and raised toilet seat in the home. They would like to think about needed equipment prior to making a decision. Discussed that [facility] would need to authorize this equipment prior to delivery to [place] RN MSN Care Management [phone]

## 2014-11-30 NOTE — Progress Notes (Signed)
   11/30/14 1310  Clinical Encounter Type  Visited With Family;Patient not available  Consult/Referral To Chaplain  Spoke to pt's mother in hallway.  She told me her son is getting better, but is still "having a difficult time."  She spoke to patient while I waited in hallway, but patient declined visit and asked if I could return another time.  Provided pastoral care and support to patient's mother in hallway.  [place] [phone]

## 2014-11-30 NOTE — Plan of Care (Signed)
Problem: Pain Management Goal: Pain control Patient will demonstrate personal actions to control pain. Outcome: Progressing Pain meds given as scheduled and PRN per eMAR. Pt able to rest comfortably this evening. IV Dilaudid given X2.  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 Margins were clear but close at 5 mm. Depth of invasion was 0.9 cm. There was no lymph vascular spread there was positive perineural spread. 22 nodes were at excised all negative for metastatic disease).extensive history of chemotherapy and radiation. Pt has open wounds on neck and chest from tumor invasion. Patient also has history of DVT in left arm and left leg. Moderate Fall precautions. Goal: Other Discharge Outcomes/Goals 1. Pain: Pain meds given as scheduled and PRN per eMAR. Pt able to rest comfortably this evening. IV Dilaudid given X2. 2. Patient hemodynamically stable this shift. BP 114/49 mmHg  Pulse 91  Temp(Src) 98.2 F (36.8 C) (Oral)  Resp 20  Ht 6\' 1"  (1.854 m)  Wt 179 lb (81.194 kg)  BMI 23.62 kg/m2  SpO2 90% 3. Complications: L. Chest dressing during day shift.  No dressing change needed this shift. Patient continuing on IV antibiotics and steroids this shift. 4. Diet: Patient tolerating current diet. 5. Activity: Patient in bed this shift. Patient is able to reposition self but prefers to remain supine in high fowlers because it aids in pain management for his neck. 6. Discharge plans for 7/19 or 7/20 depending on pain management.

## 2014-11-30 NOTE — Progress Notes (Signed)
[place] @ [facility] [phone]  [phone] OB: [birth year]  MR#: [id number]  HOZ#:[id number]  Patient Care Team: Crecencio Mc, MD as PCP - General (Internal Medicine) Algernon Huxley, MD as Consulting Physician (Vascular Surgery)  CHIEF COMPLAINT:  No chief complaint on file.   Oncology History   54. 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 Margins were clear but close at 5 mm. Depth of invasion was 0.9 cm. There was no lymph vascular spread there was positive perineural spread. 22 nodes were at excised all negative for metastatic disease). 2. Mediastinal/hilar Adenopathy on PET scan - EBUS procedure and biopsy at [facility] on 09/02/2011 negative for malignancy.  Also RLL lung biopsy at [facility] on 08/30/2011 negative for malignancy. 3. Patient had lower left cervical lymph node removed.  (Surgery was done at [facility]) followed by introperative radiation therapy.(May of 2014) 4  [place] in May of 2014 insufficient tissue 5. 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 6. Suspected Recurrent disease in the neck patient was started on cis-platinum, cetuximab and radiation therapy July17, 2014 7. Deep Vein  thromboses in the left lower extremity on Xaralto 8. Finished radiation and chemotherapy December 13, 2012 9. Abnormal CT scan and a PET scan.  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 Patient started on 5-FU , Cis-platinum, docetaxel from February 11, 2013 has finished total  5  cycles of chemotherapy. 10. Progressing disease.  Started on palliative radiation treatment with cetuximab.  (February, 201 5) 11. Finished radiation then cetuximab in April of 2015. 12. On carboplatinum and Taxol  and cetuximab 13 Started on  maintenance  cetuimab from January 21, 2014 14.progressive disease so started on  keytruda from March 15, 2014 15.ecent episode of thrombosis of left subclavian, left axillary veinpatient underwent thrombolytic therapy.  Removal of port.  (Because of infection)and started on Lovenox. 16.progressive disease on KEYTRUDA.  Palliative radiation therapy 17.palliative radiation therapy,march 201 18.  On IV methotrexate starting from April of 2016 19 progressive disease on methotrexate so patient was started on gemcitabine and vinorelbine from June of 2016     Primary cancer of tongue   08/26/2012 Initial Diagnosis Primary cancer of tongue    Oncology Flowsheet 11/25/2014 11/26/2014 11/27/2014 11/28/2014 11/29/2014 11/29/2014 11/30/2014  Day, Cycle - - - - - - -  dexamethasone (DECADRON) IV 16 mg 16 mg 16 mg 16 mg - - -  dexamethasone (DECADRON) PO - - - - 6 mg 6 mg 6 mg  enoxaparin (LOVENOX) [place] - - - - - - -  gemcitabine (GEMZAR) IV - - - - - - -  methotrexate (PF) IV - - - - - - -  methylPREDNISolone sodium succinate 125 mg/2 mL (SOLU-MEDROL) IM - - - - - - -  ondansetron (ZOFRAN) IV - - - - - - -  vinorelbine (NAVELBINE) IV - - - - - - -    INTERVAL HISTORY: 49 year old gentleman with carcinoma of head and neck stage IV disease. Patient was admitted in the hospital with intractable pain which is gradually getting better.  Feeling weak and tired.  Swelling of the left upper extremity.  Wound culture is pending As per HPI. Otherwise, a complete review of systems is negatve.  19th, 2016 Patient had significant more pain yesterday after p and Toradol was discontinued.  Patient also started on oxygen because of hypoxia last night. Patient has not ambulated yesterday. Wound culture is positive for gram-positive bacteria which is resistant to present intravenous antibiotic and only sensitive to cephalosporin, Augmentin, Bactrim DS PAST MEDICAL HISTORY: Past Medical History  Diagnosis Date  .  Allergy   . Cancer     tongue and neck  . DVT (deep venous thrombosis)     left arm and left leg    PAST SURGICAL HISTORY: Past Surgical History  Procedure Laterality Date  . Excision of tongue lesion with laser  08/13/11  . Neck dissection Bilateral 08/13/11  . Thoracotomy    . Peripheral vascular catheterization  10/27/2014    Procedure: PICC Line Insertion;  Surgeon: Katha Cabal, MD;  Location: [facility];  Service: Cardiovascular;;    FAMILY HISTORY Family History  Problem Relation Age of Onset  . Cancer Father 45    prostate          ADVANCED DIRECTIVES: Advance care directive: Patient does have advanced healthcare directive and has been reviewed.  Patient does not want to change it at present time    HEALTH MAINTENANCE: History  Substance Use Topics  . Smoking status: Never Smoker   . Smokeless tobacco: Not on file  . Alcohol Use: No     Comment: very limited      Allergies  Allergen Reactions  . Azithromycin Other (See Comments)    Pt states he gets real flushed red and sweaty.  . Penicillins Hives    Childhood reaction reported to patient by mother.  . Sulfa Antibiotics     Childhood reactions  . Erythromycin Base Swelling    Current Facility-Administered Medications  Medication Dose Route Frequency Provider Last Rate Last Dose  . acetaminophen (TYLENOL) tablet 650 mg  650 mg Oral Q4H PRN [name], MD      . citalopram (CELEXA) tablet 40 mg  40 mg Oral Daily [name], MD   40 mg at 11/30/14 0834  . dexamethasone (DECADRON) tablet 6 mg  6 mg Oral Q12H [name], MD   6 mg at 11/30/14 0834  . fentaNYL (DURAGESIC - dosed mcg/hr) 225 mcg  225 mcg Transdermal Q48H Hillary Bow, MD   225 mcg at 11/29/14 2051  . guaiFENesin (MUCINEX) 12 hr tablet 600 mg  600 mg Oral BID [name], MD   600 mg at 11/30/14 0833  . HYDROmorphone (DILAUDID) injection 3 mg  3 mg Intravenous Q1H PRN [name], MD   3 mg at 11/30/14 0843  .  HYDROmorphone (DILAUDID) tablet 6 mg  6 mg Oral 6 times per day [name], MD   6 mg at 11/30/14 1008  . metroNIDAZOLE (FLAGYL) IVPB 500 mg  500 mg Intravenous Q8H Theodoro Grist, MD   500 mg at 11/30/14 0603  . polyethylene glycol (MIRALAX / GLYCOLAX) packet 17 g  17 g Oral Daily [name], MD   17 g at 11/30/14 1009  . pregabalin (LYRICA) capsule 200 mg  200 mg Oral BID Theodoro Grist, MD   200 mg at 11/30/14 1009  . senna-docusate (Senokot-S) tablet 2 tablet  2 tablet Oral BID [name], MD   2 tablet at 11/30/14 1008  . sodium hypochlorite (DAKIN'S 1/4 STRENGTH) topical solution   Irrigation Daily PRN Nestor Lewandowsky, MD        OBJECTIVE:  Filed Vitals:  11/30/14 0525  BP: 120/67  Pulse: 85  Temp: 98.1 F (36.7 C)  Resp: 20     Body mass index is 23.62 kg/(m^2).    ECOG FS:2 - Symptomatic, <50% confined to bed  Review of system Gen. status: Performance status is declining. Increasing pain and discomfort  HEENT: Increasing swelling on the left side of the neck Increasing drainage Increasing swelling of the left side of the face  Severe pain throbbing  Swelling of left upper extremity Lungs: Increasing shortness of breath cough Cardiac: No chest pain.  No shortness of breath.  GI: Poor appetite and losing weight.  GU: No dysuria or hematuria.  Skin: No swelling.  No rash.  No ecchymoses.  Lower extremities no swelling.Neurological system: No dizziness.  No tingling.  His was.  no tingling numbness.  no focal weakness or any focal signs.\ Overall declining performance status  LAB RESULTS:  Admission on 11/24/2014  Component Date Value Ref Range Status  . WBC 11/25/2014 9.8  3.8 - 10.6 K/uL Final  . RBC 11/25/2014 2.77* 4.40 - 5.90 MIL/uL Final  . Hemoglobin 11/25/2014 9.2* 13.0 - 18.0 g/dL Final  . HCT 11/25/2014 27.9* 40.0 - 52.0 % Final  . MCV 11/25/2014 100.8* 80.0 - 100.0 fL Final  . MCH 11/25/2014 33.3  26.0 - 34.0 pg Final  . MCHC 11/25/2014 33.1  32.0 - 36.0 g/dL  Final  . RDW 11/25/2014 21.7* 11.5 - 14.5 % Final  . Platelets 11/25/2014 228  150 - 440 K/uL Final  . Sodium 11/25/2014 135  135 - 145 mmol/L Final  . Potassium 11/25/2014 4.4  3.5 - 5.1 mmol/L Final  . Chloride 11/25/2014 99* 101 - 111 mmol/L Final  . CO2 11/25/2014 30  22 - 32 mmol/L Final  . Glucose, Bld 11/25/2014 139* 65 - 99 mg/dL Final  . BUN 11/25/2014 14  6 - 20 mg/dL Final  . Creatinine, Ser 11/25/2014 0.62  0.61 - 1.24 mg/dL Final  . Calcium 11/25/2014 8.2* 8.9 - 10.3 mg/dL Final  . Total Protein 11/25/2014 4.8* 6.5 - 8.1 g/dL Final  . Albumin 11/25/2014 1.9* 3.5 - 5.0 g/dL Final  . AST 11/25/2014 15  15 - 41 U/L Final  . ALT 11/25/2014 39  17 - 63 U/L Final  . Alkaline Phosphatase 11/25/2014 120  38 - 126 U/L Final  . Total Bilirubin 11/25/2014 0.6  0.3 - 1.2 mg/dL Final  . GFR calc non Af Amer 11/25/2014 >60  >60 mL/min Final  . GFR calc Af Amer 11/25/2014 >60  >60 mL/min Final   Comment: (NOTE) The eGFR has been calculated using the CKD EPI equation. This calculation has not been validated in all clinical situations. eGFR's persistently <60 mL/min signify possible Chronic Kidney Disease.   . Anion gap 11/25/2014 6  5 - 15 Final  . Specimen Description 11/24/2014 BLOOD RIGHT ASSIST CONTROL   Final  . Special Requests 11/24/2014 BOTTLES DRAWN AEROBIC AND ANAEROBIC 10ML   Final  . Culture 11/24/2014 NO GROWTH 5 DAYS   Final  . Report Status 11/24/2014 11/29/2014 FINAL   Final  . Specimen Description 11/24/2014 Blood   Final  . Special Requests 11/24/2014 NONE   Final  . Report Status 11/24/2014 11/26/2014 FINAL   Final  . Specimen Description 11/24/2014 Blood   Final  . Special Requests 11/24/2014 NONE   Final  . Report Status 11/24/2014 11/26/2014 FINAL   Final  . MRSA by PCR 11/24/2014 NEGATIVE  NEGATIVE Final  .  Sodium 11/26/2014 137  135 - 145 mmol/L Final  . Potassium 11/26/2014 3.9  3.5 - 5.1 mmol/L Final  . Chloride 11/26/2014 104  101 - 111 mmol/L Final  .  CO2 11/26/2014 28  22 - 32 mmol/L Final  . Glucose, Bld 11/26/2014 124* 65 - 99 mg/dL Final  . BUN 11/26/2014 15  6 - 20 mg/dL Final  . Creatinine, Ser 11/26/2014 0.47* 0.61 - 1.24 mg/dL Final  . Calcium 11/26/2014 7.9* 8.9 - 10.3 mg/dL Final  . GFR calc non Af Amer 11/26/2014 >60  >60 mL/min Final  . GFR calc Af Amer 11/26/2014 >60  >60 mL/min Final   Comment: (NOTE) The eGFR has been calculated using the CKD EPI equation. This calculation has not been validated in all clinical situations. eGFR's persistently <60 mL/min signify possible Chronic Kidney Disease.   . Anion gap 11/26/2014 5  5 - 15 Final  . WBC 11/26/2014 8.0  3.8 - 10.6 K/uL Final  . RBC 11/26/2014 2.57* 4.40 - 5.90 MIL/uL Final  . Hemoglobin 11/26/2014 8.6* 13.0 - 18.0 g/dL Final  . HCT 11/26/2014 26.1* 40.0 - 52.0 % Final  . MCV 11/26/2014 101.5* 80.0 - 100.0 fL Final  . MCH 11/26/2014 33.5  26.0 - 34.0 pg Final  . MCHC 11/26/2014 33.0  32.0 - 36.0 g/dL Final  . RDW 11/26/2014 22.7* 11.5 - 14.5 % Final  . Platelets 11/26/2014 215  150 - 440 K/uL Final  . Magnesium 11/26/2014 1.9  1.7 - 2.4 mg/dL Final  . Specimen Description 11/25/2014 BONE   Final  . Special Requests 11/25/2014 Immunocompromised   Final  . Gram Stain 11/25/2014    Final                   Value:FEW WBC SEEN MANY GRAM NEGATIVE RODS MANY GRAM POSITIVE COCCI   . Culture 11/25/2014 MODERATE GROWTH ACALIGENES FAECALIS   Final  . Report Status 11/25/2014 11/29/2014 FINAL   Final  . Organism ID, Bacteria 11/25/2014 ACALIGENES FAECALIS   Final  . Specimen Description 11/25/2014 BONE   Final  . Special Requests 11/25/2014 Immunocompromised   Final  . Culture 11/25/2014 HOLDING FOR POSSIBLE ANAEROBE   Final  . Report Status 11/25/2014 PENDING   Incomplete  . Vancomycin Tr 11/26/2014 17  10 - 20 ug/mL Final  . Hemoglobin 11/27/2014 8.7* 13.0 - 18.0 g/dL Final  . Creatinine, Ser 11/28/2014 0.54* 0.61 - 1.24 mg/dL Final  . GFR calc non Af Amer 11/28/2014  >60  >60 mL/min Final  . GFR calc Af Amer 11/28/2014 >60  >60 mL/min Final   Comment: (NOTE) The eGFR has been calculated using the CKD EPI equation. This calculation has not been validated in all clinical situations. eGFR's persistently <60 mL/min signify possible Chronic Kidney Disease.      ASSESSMENT: 49 year old gentleman with stage IV and recurrent and progressive carcinoma of tongue Current culture is positive forb Acaligenes Fecalis sensitive to ceftriaxone, Bactrim DS, and Augmentin Patient is allergic to penicillin and sulfa Social was started on ceftriaxone and continues Flagyl cancer  Limited code  Try to ambulate patient May need IV ceftriaxone as outpatient will be arranged Discuss ed  possible options of chemotherapy since palliative care  Continue Toradol, Decadron, intravenous Dilaudid and Dilaudid by mouth       Palliative care options versus further chemotherapy with cis-platinum and 5-FU.  And or vectibix or cetuximab.    Primary cancer of tongue   Staging form: Lip and  Oral Cavity, AJCC 7th Edition     Clinical: Stage IVC (T2, N2, M1) - Unsigned   [name], MD   11/30/2014 10:39 AM

## 2014-11-30 NOTE — Progress Notes (Signed)
11/30/2014 12:46 PM  Celene Skeen [id number]    Temp:  [97.8 F (36.6 C)-98.2 F (36.8 C)] 98.1 F (36.7 C) (07/19 0525) Pulse Rate:  [85-100] 85 (07/19 0525) Resp:  [19-20] 20 (07/19 0525) BP: (114-120)/(49-71) 120/67 mmHg (07/19 0525) SpO2:  [90 %-95 %] 95 % (07/19 0525),     Intake/Output Summary (Last 24 hours) at 11/30/14 1246 Last data filed at 11/29/14 1721  Gross per 24 hour  Intake    240 ml  Output   1300 ml  Net  -1060 ml    Results for orders placed or performed during the hospital encounter of 11/24/14 (from the past 24 hour(s))  Creatinine, serum     Status: None   Collection Time: 11/30/14 10:22 AM  Result Value Ref Range   Creatinine, Ser 0.67 0.61 - 1.24 mg/dL   GFR calc non Af Amer >60 >60 mL/min   GFR calc Af Amer >60 >60 mL/min    SUBJECTIVE:  Continues to have pain.  Required IV pain meds X 1.  Culture back.  Has not started on Rocephin.  Increased swelling of left arm.  Patient ambulates to bedside commode but has not ambulated more in last several days.  OBJECTIVE: Gen- Responsive, alert Neck- packing with serosanguinous drainage, increased from yesterday.  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  Agree with compression on arm and elevation.  Defer to Onc regarding Lovenox but may benefit from restarting due to swelling. 3)  Patient with increased cough as well, on Mucinex now.  Needs to ambulate more.  Was seen by PT in ICU. 4)  Wife wishes to change dressing under nurse's guidance prior to discharge.  Shonique Pelphrey 11/30/2014, 12:46 PM

## 2014-12-01 ENCOUNTER — Other Ambulatory Visit: Payer: Self-pay | Admitting: *Deleted

## 2014-12-01 ENCOUNTER — Other Ambulatory Visit: Payer: Self-pay | Admitting: Family Medicine

## 2014-12-01 DIAGNOSIS — R52 Pain, unspecified: Secondary | ICD-10-CM

## 2014-12-01 DIAGNOSIS — C029 Malignant neoplasm of tongue, unspecified: Secondary | ICD-10-CM

## 2014-12-01 MED ORDER — METRONIDAZOLE 500 MG PO TABS: 500.0000 mg | ORAL_TABLET | Freq: Three times a day (TID) | ORAL | Status: AC

## 2014-12-01 MED ORDER — CITALOPRAM HYDROBROMIDE 40 MG PO TABS: 40.0000 mg | ORAL_TABLET | Freq: Every day | ORAL | Status: AC

## 2014-12-01 MED ORDER — DEXTROSE 5 % IV SOLN: 2.0000 g | INTRAVENOUS | Status: AC

## 2014-12-01 MED ORDER — HYDROMORPHONE HCL 2 MG PO TABS: ORAL_TABLET | ORAL | Status: DC

## 2014-12-01 MED ORDER — PREGABALIN 200 MG PO CAPS: 200.0000 mg | ORAL_CAPSULE | Freq: Two times a day (BID) | ORAL | Status: AC

## 2014-12-01 MED ORDER — FENTANYL 75 MCG/HR TD PT72: 225.0000 ug | MEDICATED_PATCH | TRANSDERMAL | Status: AC

## 2014-12-01 MED ORDER — SENNOSIDES-DOCUSATE SODIUM 8.6-50 MG PO TABS: 2.0000 | ORAL_TABLET | Freq: Two times a day (BID) | ORAL | Status: AC

## 2014-12-01 MED ORDER — POLYETHYLENE GLYCOL 3350 17 G PO PACK
17.0000 g | PACK | Freq: Every day | ORAL | Status: AC
Start: 2014-12-01 — End: ?

## 2014-12-01 MED ORDER — DEXAMETHASONE 6 MG PO TABS: 6.0000 mg | ORAL_TABLET | Freq: Two times a day (BID) | ORAL | Status: AC

## 2014-12-01 MED ORDER — FENTANYL 75 MCG/HR TD PT72: 225.0000 ug | MEDICATED_PATCH | TRANSDERMAL | Status: DC

## 2014-12-01 MED ORDER — HYDROMORPHONE HCL 2 MG PO TABS: 2.0000 mg | ORAL_TABLET | ORAL | Status: AC | PRN

## 2014-12-01 MED ORDER — DEXTROSE 5 % IV SOLN
2.0000 g | Freq: Once | INTRAVENOUS | Status: AC
  Administered 2014-12-01: 14:00:00 2 g via INTRAVENOUS
  Filled 2014-12-01: qty 2

## 2014-12-01 MED ORDER — PREGABALIN 200 MG PO CAPS: 200.0000 mg | ORAL_CAPSULE | Freq: Two times a day (BID) | ORAL | Status: DC

## 2014-12-01 NOTE — Plan of Care (Signed)
Problem: Discharge Progression Outcomes Goal: Other Discharge Outcomes/Goals Outcome: Progressing Picked up patient at 2300. Patient is alert and oriented, receiving scheduled 6 mg of Dilaudid. Wife at bedside. Patient is resting quietly.

## 2014-12-01 NOTE — Progress Notes (Signed)
[place] @ [facility] [phone]) [phone]  Fax:([phone]) [place] OB: [birth year]  MR#: [id number]  FIE#:[id number]  Patient Care Team: Crecencio Mc, MD as PCP - General (Internal Medicine) Algernon Huxley, MD as Consulting Physician (Vascular Surgery)  CHIEF COMPLAINT:  No chief complaint on file.   Oncology History   54. 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 Margins were clear but close at 5 mm. Depth of invasion was 0.9 cm. There was no lymph vascular spread there was positive perineural spread. 22 nodes were at excised all negative for metastatic disease). 2. Mediastinal/hilar Adenopathy on PET scan - EBUS procedure and biopsy at [facility] on 09/02/2011 negative for malignancy.  Also RLL lung biopsy at [facility] on 08/30/2011 negative for malignancy. 3. Patient had lower left cervical lymph node removed.  (Surgery was done at [facility]) followed by introperative radiation therapy.(May of 2014) 4  Ebus in May of 2014 insufficient tissue 5. 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 6. Suspected Recurrent disease in the neck patient was started on cis-platinum, cetuximab and radiation therapy July17, 2014 7. Deep Vein  thromboses in the left lower extremity on Xaralto 8. Finished radiation and chemotherapy December 13, 2012 9. Abnormal CT scan and a PET scan.  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 Patient started on 5-FU , Cis-platinum, docetaxel from February 11, 2013 has finished total  5  cycles of chemotherapy. 10. Progressing disease.  Started on palliative radiation treatment with cetuximab.  (February, 201 5) 11. Finished radiation then cetuximab in April of 2015. 12. On carboplatinum and Taxol  and cetuximab 13 Started on  maintenance  cetuimab from January 21, 2014 14.progressive disease so started on  keytruda from March 15, 2014 15.ecent episode of thrombosis of left subclavian, left axillary veinpatient underwent thrombolytic therapy.  Removal of port.  (Because of infection)and started on Lovenox. 16.progressive disease on KEYTRUDA.  Palliative radiation therapy 17.palliative radiation therapy,march 201 18.  On IV methotrexate starting from April of 2016 19 progressive disease on methotrexate so patient was started on gemcitabine and vinorelbine from June of 2016     Primary cancer of tongue   08/26/2012 Initial Diagnosis Primary cancer of tongue    Oncology Flowsheet 11/27/2014 11/28/2014 11/29/2014 11/29/2014 11/30/2014 11/30/2014 12/01/2014  Day, Cycle - - - - - - -  dexamethasone (DECADRON) IV 16 mg 16 mg - - - - -  dexamethasone (DECADRON) PO - - 6 mg 6 mg 6 mg 6 mg 6 mg  enoxaparin (LOVENOX)  - - - - - - -  gemcitabine (GEMZAR) IV - - - - - - -  methotrexate (PF) IV - - - - - - -  methylPREDNISolone sodium succinate 125 mg/2 mL (SOLU-MEDROL) IM - - - - - - -  ondansetron (ZOFRAN) IV - - - - - - -  vinorelbine (NAVELBINE) IV - - - - - - -    INTERVAL HISTORY: 49 year old gentleman with carcinoma of head and neck stage IV disease. Patient was admitted in the hospital with intractable pain which is gradually getting better.  Feeling weak and tired.  Swelling of the left upper extremity.  Wound culture is pending As per HPI. Otherwise, a complete review of systems is negatve.  19th, 2016 Patient had significant more pain yesterday after p and Toradol was discontinued.  Patient also started on oxygen because of hypoxia last night. Patient has not ambulated yesterday. Wound culture is positive for gram-positive bacteria which is resistant to present intravenous antibiotic and only sensitive to cephalosporin, Augmentin, Bactrim DS  December 01, 2014 Patient is off oxygen.  Ambulated with the help of  walker and assistance.  Pain is under better control.  We will discharge this patient with home health agency continuing wound care. PAST MEDICAL HISTORY: Past Medical History  Diagnosis Date  . Allergy   . Cancer     tongue and neck  . DVT (deep venous thrombosis)     left arm and left leg    PAST SURGICAL HISTORY: Past Surgical History  Procedure Laterality Date  . Excision of tongue lesion with laser  08/13/11  . Neck dissection Bilateral 08/13/11  . Thoracotomy    . Peripheral vascular catheterization  10/27/2014    Procedure: PICC Line Insertion;  Surgeon: Katha Cabal, MD;  Location: [place] [facility];  Service: Cardiovascular;;    FAMILY HISTORY Family History  Problem Relation Age of Onset  . Cancer Father 41    prostate          ADVANCED DIRECTIVES: Advance care directive: Patient does have advanced healthcare directive and has been reviewed.  Patient does not want to change it at present time    HEALTH MAINTENANCE: History  Substance Use Topics  . Smoking status: Never Smoker   . Smokeless tobacco: Not on file  . Alcohol Use: No     Comment: very limited      Allergies  Allergen Reactions  . Azithromycin Other (See Comments)    Pt states he gets real flushed red and sweaty.  . Penicillins Hives    Childhood reaction reported to patient by mother.  . Sulfa Antibiotics     Childhood reactions  . Erythromycin Base Swelling    Current Facility-Administered Medications  Medication Dose Route Frequency Provider Last Rate Last Dose  . acetaminophen (TYLENOL) tablet 650 mg  650 mg Oral Q4H PRN [name], MD      . citalopram (CELEXA) tablet 40 mg  40 mg Oral Daily [name], MD   40 mg at 12/01/14 0823  . dexamethasone (DECADRON) tablet 6 mg  6 mg Oral Q12H [name], MD   6 mg at 12/01/14 0824  . fentaNYL (DURAGESIC - dosed mcg/hr) 225 mcg  225 mcg Transdermal Q48H Hillary Bow, MD   225 mcg at 11/29/14 2051  . HYDROmorphone  (DILAUDID) tablet 6 mg  6 mg Oral 6 times per day [name], MD   6 mg at 12/01/14 0824  . polyethylene glycol (MIRALAX / GLYCOLAX) packet 17 g  17 g Oral Daily [name], MD   17 g at 12/01/14 0824  . pregabalin (LYRICA) capsule 200 mg  200 mg Oral BID Theodoro Grist, MD   200 mg at 11/30/14 1940  . senna-docusate (Senokot-S) tablet 2 tablet  2 tablet Oral BID [name], MD   2 tablet at 12/01/14 0824  . sodium chloride 0.9 % injection 10 mL  10 mL Intravenous PRN [name], MD   10 mL at 11/30/14 2114  . sodium hypochlorite (DAKIN'S 1/4 STRENGTH) topical solution   Irrigation Daily PRN Nestor Lewandowsky, MD        OBJECTIVE:  Filed Vitals:   12/01/14 0513  BP: 118/68  Pulse: 98  Temp: 98.2 F (36.8 C)  Resp: 20     Body mass index is 23.62 kg/(m^2).    ECOG FS:2 - Symptomatic, <50% confined to bed  Review of system Gen. status: Performance status is declining. Increasing pain and discomfort  HEENT: Increasing swelling on the left side of the neck Increasing drainage Increasing swelling of the left side of the face  Severe pain throbbing  Swelling of left upper extremity Lungs: Increasing shortness of breath cough Cardiac: No chest pain.  No shortness of breath.  GI: Poor appetite and losing weight.  GU: No dysuria or hematuria.  Skin: No swelling.  No rash.  No ecchymoses.  Lower extremities no swelling.Neurological system: No dizziness.  No tingling.  His was.  no tingling numbness.  no focal weakness or any focal signs.\ Overall declining performance status  LAB RESULTS:  Admission on 11/24/2014  Component Date Value Ref Range Status  . WBC 11/25/2014 9.8  3.8 - 10.6 K/uL Final  . RBC 11/25/2014 2.77* 4.40 - 5.90 MIL/uL Final  . Hemoglobin 11/25/2014 9.2* 13.0 - 18.0 g/dL Final  . HCT 11/25/2014 27.9* 40.0 - 52.0 % Final  . MCV 11/25/2014 100.8* 80.0 - 100.0 fL Final  . MCH 11/25/2014 33.3  26.0 - 34.0 pg Final  . MCHC 11/25/2014 33.1  32.0 - 36.0 g/dL Final  . RDW  11/25/2014 21.7* 11.5 - 14.5 % Final  . Platelets 11/25/2014 228  150 - 440 K/uL Final  . Sodium 11/25/2014 135  135 - 145 mmol/L Final  . Potassium 11/25/2014 4.4  3.5 - 5.1 mmol/L Final  . Chloride 11/25/2014 99* 101 - 111 mmol/L Final  . CO2 11/25/2014 30  22 - 32 mmol/L Final  . Glucose, Bld 11/25/2014 139* 65 - 99 mg/dL Final  . BUN 11/25/2014 14  6 - 20 mg/dL Final  . Creatinine, Ser 11/25/2014 0.62  0.61 - 1.24 mg/dL Final  . Calcium 11/25/2014 8.2* 8.9 - 10.3 mg/dL Final  . Total Protein 11/25/2014 4.8* 6.5 - 8.1 g/dL Final  . Albumin 11/25/2014 1.9* 3.5 - 5.0 g/dL Final  . AST 11/25/2014 15  15 - 41 U/L Final  . ALT 11/25/2014 39  17 - 63 U/L Final  . Alkaline Phosphatase 11/25/2014 120  38 - 126 U/L Final  . Total Bilirubin 11/25/2014 0.6  0.3 - 1.2 mg/dL Final  . GFR calc non Af Amer 11/25/2014 >60  >60 mL/min Final  . GFR calc Af Amer 11/25/2014 >60  >60 mL/min Final   Comment: (NOTE) The eGFR has been calculated using the CKD EPI equation. This calculation has not been validated in all clinical situations. eGFR's persistently <60 mL/min signify possible Chronic Kidney Disease.   . Anion gap 11/25/2014 6  5 - 15 Final  . Specimen Description 11/24/2014 BLOOD RIGHT ASSIST CONTROL   Final  . Special Requests 11/24/2014 BOTTLES DRAWN AEROBIC AND ANAEROBIC 10ML   Final  . Culture 11/24/2014 NO GROWTH 5 DAYS   Final  . Report Status 11/24/2014 11/29/2014 FINAL   Final  . Specimen Description 11/24/2014 Blood   Final  . Special Requests 11/24/2014 NONE   Final  . Report Status 11/24/2014 11/26/2014 FINAL   Final  . Specimen Description 11/24/2014 Blood   Final  . Special Requests 11/24/2014 NONE   Final  . Report Status 11/24/2014 11/26/2014 FINAL   Final  . MRSA by PCR 11/24/2014 NEGATIVE  NEGATIVE Final  . Sodium 11/26/2014 137  135 - 145 mmol/L Final  .  Potassium 11/26/2014 3.9  3.5 - 5.1 mmol/L Final  . Chloride 11/26/2014 104  101 - 111 mmol/L Final  . CO2 11/26/2014  28  22 - 32 mmol/L Final  . Glucose, Bld 11/26/2014 124* 65 - 99 mg/dL Final  . BUN 11/26/2014 15  6 - 20 mg/dL Final  . Creatinine, Ser 11/26/2014 0.47* 0.61 - 1.24 mg/dL Final  . Calcium 11/26/2014 7.9* 8.9 - 10.3 mg/dL Final  . GFR calc non Af Amer 11/26/2014 >60  >60 mL/min Final  . GFR calc Af Amer 11/26/2014 >60  >60 mL/min Final   Comment: (NOTE) The eGFR has been calculated using the CKD EPI equation. This calculation has not been validated in all clinical situations. eGFR's persistently <60 mL/min signify possible Chronic Kidney Disease.   . Anion gap 11/26/2014 5  5 - 15 Final  . WBC 11/26/2014 8.0  3.8 - 10.6 K/uL Final  . RBC 11/26/2014 2.57* 4.40 - 5.90 MIL/uL Final  . Hemoglobin 11/26/2014 8.6* 13.0 - 18.0 g/dL Final  . HCT 11/26/2014 26.1* 40.0 - 52.0 % Final  . MCV 11/26/2014 101.5* 80.0 - 100.0 fL Final  . MCH 11/26/2014 33.5  26.0 - 34.0 pg Final  . MCHC 11/26/2014 33.0  32.0 - 36.0 g/dL Final  . RDW 11/26/2014 22.7* 11.5 - 14.5 % Final  . Platelets 11/26/2014 215  150 - 440 K/uL Final  . Magnesium 11/26/2014 1.9  1.7 - 2.4 mg/dL Final  . Specimen Description 11/25/2014 BONE   Final  . Special Requests 11/25/2014 Immunocompromised   Final  . Gram Stain 11/25/2014    Final                   Value:FEW WBC SEEN MANY GRAM NEGATIVE RODS MANY GRAM POSITIVE COCCI   . Culture 11/25/2014 MODERATE GROWTH ACALIGENES FAECALIS   Final  . Report Status 11/25/2014 11/29/2014 FINAL   Final  . Organism ID, Bacteria 11/25/2014 ACALIGENES FAECALIS   Final  . Specimen Description 11/25/2014 BONE   Final  . Special Requests 11/25/2014 Immunocompromised   Final  . Culture 11/25/2014 HOLDING FOR POSSIBLE ANAEROBE   Final  . Report Status 11/25/2014 PENDING   Incomplete  . Vancomycin Tr 11/26/2014 17  10 - 20 ug/mL Final  . Hemoglobin 11/27/2014 8.7* 13.0 - 18.0 g/dL Final  . Creatinine, Ser 11/28/2014 0.54* 0.61 - 1.24 mg/dL Final  . GFR calc non Af Amer 11/28/2014 >60  >60 mL/min  Final  . GFR calc Af Amer 11/28/2014 >60  >60 mL/min Final   Comment: (NOTE) The eGFR has been calculated using the CKD EPI equation. This calculation has not been validated in all clinical situations. eGFR's persistently <60 mL/min signify possible Chronic Kidney Disease.   . WBC 11/30/2014 13.2* 3.8 - 10.6 K/uL Final  . RBC 11/30/2014 3.05* 4.40 - 5.90 MIL/uL Final  . Hemoglobin 11/30/2014 10.0* 13.0 - 18.0 g/dL Final  . HCT 11/30/2014 31.3* 40.0 - 52.0 % Final  . MCV 11/30/2014 102.9* 80.0 - 100.0 fL Final  . MCH 11/30/2014 32.7  26.0 - 34.0 pg Final  . MCHC 11/30/2014 31.8* 32.0 - 36.0 g/dL Final  . RDW 11/30/2014 22.8* 11.5 - 14.5 % Final  . Platelets 11/30/2014 220  150 - 440 K/uL Final  . Neutrophils Relative % 11/30/2014 75  43 - 77 % Final  . Lymphocytes Relative 11/30/2014 2* 12 - 46 % Final  . Monocytes Relative 11/30/2014 6  3 - 12 % Final  .  Eosinophils Relative 11/30/2014 0  0 - 5 % Final  . Basophils Relative 11/30/2014 0  0 - 1 % Final  . Band Neutrophils 11/30/2014 14* 0 - 10 % Final  . Metamyelocytes Relative 11/30/2014 3   Final  . Myelocytes 11/30/2014 0   Final  . Promyelocytes Absolute 11/30/2014 0   Final  . Blasts 11/30/2014 0   Final  . nRBC 11/30/2014 1* 0 /100 WBC Final  . Other 11/30/2014 0   Final  . Smear Review 11/30/2014 MORPHOLOGY UNREMARKABLE   Final  . Neutro Abs 11/30/2014 12.1* 1.7 - 7.7 K/uL Final  . Lymphs Abs 11/30/2014 0.3* 0.7 - 4.0 K/uL Final  . Monocytes Absolute 11/30/2014 0.8  0.1 - 1.0 K/uL Final  . Eosinophils Absolute 11/30/2014 0.0  0.0 - 0.7 K/uL Final  . Basophils Absolute 11/30/2014 0.0  0.0 - 0.1 K/uL Final  . Creatinine, Ser 11/30/2014 0.67  0.61 - 1.24 mg/dL Final  . GFR calc non Af Amer 11/30/2014 >60  >60 mL/min Final  . GFR calc Af Amer 11/30/2014 >60  >60 mL/min Final   Comment: (NOTE) The eGFR has been calculated using the CKD EPI equation. This calculation has not been validated in all clinical situations. eGFR's  persistently <60 mL/min signify possible Chronic Kidney Disease.      ASSESSMENT: 49 year old gentleman with stage IV and recurrent and progressive carcinoma of tongue Current culture is positive forb Acaligenes Fecalis sensitive to ceftriaxone, Bactrim DS, and Augmentin Patient is allergic to penicillin and sulfa Social was started on ceftriaxone and continues Flagyl cancer  Limited code  Try to ambulate patient May need IV ceftriaxone as outpatient will be arranged Discuss ed  possible options of chemotherapy since palliative care  Continue Toradol, Decadron, intravenous Dilaudid and Dilaudid by mouth   Patient would need IV Rocephin 2 g intravenously daily for 5 days.  Patient would need a hospital bed because patient suffers from chronic neck pain and pain in the left upper extremity due to progressing malignancy and cellulitis.  Patient has recurrent head and neck cancer.  Hospital bed will eliminate or help to reduce his pain and discomfort to ensure easy movement.  And changing in body position would be much more comfortable. Prognosis is guarded    Palliative care options versus further chemotherapy with cis-platinum and 5-FU.  And or vectibix or cetuximab.    Primary cancer of tongue   Staging form: Lip and Oral Cavity, AJCC 7th Edition     Clinical: Stage IVC (T2, N2, M1) - Unsigned   [name], MD   12/01/2014 9:31 AM

## 2014-12-01 NOTE — Progress Notes (Signed)
Discharge instructions given to patient and wife along with prescriptions-escorted home via car and wife

## 2014-12-01 NOTE — Care Management (Signed)
Discharge to home today per Dr. Jeb Levering. Will be followed by [facility] for Nursing and IV Rocephin. Hospital bed and bedside commode ordered from [facility] Wife will transport Shelbie Ammons RN MSN Care Management [phone]

## 2014-12-01 NOTE — Progress Notes (Signed)
Dr Oliva Bustard called and confirmed that the cancer center will call patient for appointment. Take mucinex if needed.

## 2014-12-01 NOTE — Progress Notes (Signed)
..   12/01/2014 12:35 PM  Celene Skeen [id number]    Temp:  [97.7 F (36.5 C)-98.3 F (36.8 C)] 98.2 F (36.8 C) (07/20 0513) Pulse Rate:  [98-105] 98 (07/20 0513) Resp:  [18-20] 20 (07/20 0513) BP: (105-120)/(65-71) 118/68 mmHg (07/20 0513) SpO2:  [90 %-91 %] 91 % (07/20 0513),     Intake/Output Summary (Last 24 hours) at 12/01/14 1235 Last data filed at 11/30/14 2100  Gross per 24 hour  Intake      0 ml  Output    600 ml  Net   -600 ml    No results found for this or any previous visit (from the past 24 hour(s)).  SUBJECTIVE:  No acute events.  Concern over packing dressing yesterday afternoon.  Instructed nurse it is ok to lightly pack with single gauze at this time.  Continues to have pain.  Anticipate discharge later today with Rocephin and home health.  OBJECTIVE:   Gen- supine in bed, dressing intact Neck- dressing with serosanguinous fluid, improved from previous  IMPRESSION:  Recurrent SCCA with open wound/infection  PLAN:  Continue Rocephin and daily dressing changes.  PICC line dressing change today prior discharge.  Discussed with patient and family that is any concern arises about the wound/packing that I would be happy to see them in my office over the next several days.  Defer to Heme/Onc regarding palliative treatment.  Micala Saltsman 12/01/2014, 12:35 PM

## 2014-12-01 NOTE — Discharge Summary (Signed)
Physician Discharge Summary  Patient ID: Corey Sims MRN: [id number] DOB/AGE: 48-Apr-1967 48 y.o.  Admit date: 11/24/2014 Discharge date: 12/01/2014  Admission Diagnoses: Intractable pain Squamous cell carcinoma of head and neck Open wound Cellulitis of the face with    ACALIGENES FAECALIS bacteria.   Discharge Diagnoses:  Active Problems:   Intractable pain  Squamous cell carcinoma of head and neck progressing disease. Cellulitis of the face Left upper extremity swelling most likely lymphedema  Discharged Condition: poor  Hospital Course:  During hospital stay patient was started on IV Dilaudid.  IV Toradol steroid.  Wound culture was up pain.  Appropriate antibiotic was started as patient was allergic to Bactrim as well as penicillin IV Rocephin was started to which bacteria was sensitive. Palliative care consult was up pain.  After prolonged discussion with patient he agreed for limited code. Lovenox was discontinued because of bone bleeding. Patient had a left upper extremities extremities swelling most likely due to lymphedema versus thrombosis Overall patient requires significant amount of pain medication to control pain Patient is now being discharged in overall poor general condition.  Possibility of hospice would be considered in family is thinking about palliative care option.    Consults: general surgery and ENT evaluation  ENT evaluation Palliative care evaluation  Significant Diagnostic Studies: labs:  1 culture blood and wound  Treatments: IV hydration and antibiotics: vancomycin, IV Levaquin. IV ceftriaxone IV Dilaudid later on switched over to by mouth Dilaudid. IV steroid Wound dressing  Discharge Exam: Blood pressure 114/68, pulse 96, temperature 98.5 F (36.9 C), temperature source Oral, resp. rate 20, height 6\' 1"  (1.854 m), weight 179 lb (81.194 kg), SpO2 91 %. Patient continues to decline has left upper extremity swelling.  Facial swelling is  improved.  Tachycardia.  Lungs rhonchi and rales.  Disposition: 01-Home or Self Care  Home health agency to continue dressing and continue IV antibiotics  Discharge Instructions    DME Bedside commode    Complete by:  As directed      DME Hospital bed    Complete by:  As directed   Bed type:  Semi-electric     Discharge wound care:    Complete by:  As directed   As per hospital management. Please instruct family and write instructions.            Medication List    STOP taking these medications        doxycycline 100 MG capsule  Commonly known as:  VIBRAMYCIN     doxycycline 100 MG tablet  Commonly known as:  VIBRA-TABS     enoxaparin 80 MG/0.8ML injection  Commonly known as:  LOVENOX     levofloxacin 500 MG tablet  Commonly known as:  LEVAQUIN     oxyCODONE 15 MG immediate release tablet  Commonly known as:  ROXICODONE     PERIDEX 0.12 % solution  Generic drug:  chlorhexidine      TAKE these medications        azelastine 0.1 % nasal spray  Commonly known as:  ASTELIN  Place 1 spray into the nose 2 (two) times daily as needed. For allergies     calcium-vitamin D 500-200 MG-UNIT per tablet  Commonly known as:  OSCAL WITH D  Take 1 tablet by mouth 2 (two) times daily.     cefTRIAXone 2 g in dextrose 5 % 50 mL  Inject 2 g into the vein daily.     citalopram 40 MG tablet  Commonly known as:  CELEXA  Take 1 tablet (40 mg total) by mouth daily.     dexamethasone 6 MG tablet  Commonly known as:  DECADRON  Take 1 tablet (6 mg total) by mouth every 12 (twelve) hours.     docusate sodium 100 MG capsule  Commonly known as:  COLACE  Take 100 mg by mouth daily as needed. for constipation.     fentaNYL 75 MCG/HR  Commonly known as:  DURAGESIC - dosed mcg/hr  Place 3 patches (225 mcg total) onto the skin every other day.     fluticasone 50 MCG/ACT nasal spray  Commonly known as:  FLONASE  Place 2 sprays into the nose daily as needed. for allergies      HYDROmorphone 2 MG tablet  Commonly known as:  DILAUDID  Take 1 tablet (2 mg total) by mouth every 4 (four) hours as needed for severe pain.     HYDROmorphone 2 MG tablet  Commonly known as:  DILAUDID  Take 3 tabs (6mg ) every 4 hours (6 times per day) May also take 2-3 tabs every 3 hours for breakthrough pain     metroNIDAZOLE 500 MG tablet  Commonly known as:  FLAGYL  Take 1 tablet (500 mg total) by mouth 3 (three) times daily.     omeprazole 20 MG tablet  Commonly known as:  PRILOSEC OTC  Take 20 mg by mouth every morning.     polyethylene glycol packet  Commonly known as:  MIRALAX / GLYCOLAX  Take 17 g by mouth daily.     pregabalin 200 MG capsule  Commonly known as:  LYRICA  Take 1 capsule (200 mg total) by mouth 2 (two) times daily.     pregabalin 200 MG capsule  Commonly known as:  LYRICA  Take 1 capsule (200 mg total) by mouth 2 (two) times daily.     senna-docusate 8.6-50 MG per tablet  Commonly known as:  Senokot-S  Take 2 tablets by mouth 2 (two) times daily.         SignedForest Gleason 12/01/2014, 4:40 PM

## 2014-12-02 LAB — ANAEROBIC CULTURE

## 2014-12-03 ENCOUNTER — Telehealth: Payer: Self-pay | Admitting: *Deleted

## 2014-12-03 LAB — CULTURE, BLOOD (ROUTINE X 2)

## 2014-12-03 NOTE — Telephone Encounter (Signed)
Needs order for 8.5 X 9 Allevyn Life Dressing.  Product #[id number].  Please fax order to [phone]

## 2014-12-03 NOTE — Telephone Encounter (Signed)
Order faxed.

## 2014-12-06 ENCOUNTER — Ambulatory Visit (HOSPITAL_COMMUNITY)
Admission: AD | Admit: 2014-12-06 | Discharge: 2014-12-06 | Disposition: A | Discharge status: Home / Self Care | Payer: BC Managed Care – PPO | Source: Other Acute Inpatient Hospital | Attending: Emergency Medicine | Admitting: Emergency Medicine

## 2014-12-06 ENCOUNTER — Emergency Department: Payer: BC Managed Care – PPO

## 2014-12-06 ENCOUNTER — Other Ambulatory Visit: Payer: Self-pay

## 2014-12-06 ENCOUNTER — Emergency Department
Admission: EM | Admit: 2014-12-06 | Discharge: 2014-12-06 | Payer: BC Managed Care – PPO | Attending: Emergency Medicine | Admitting: Emergency Medicine

## 2014-12-06 ENCOUNTER — Encounter: Payer: Self-pay | Admitting: Emergency Medicine

## 2014-12-06 DIAGNOSIS — Z880 Allergy status to penicillin: Secondary | ICD-10-CM | POA: Insufficient documentation

## 2014-12-06 DIAGNOSIS — I2699 Other pulmonary embolism without acute cor pulmonale: Secondary | ICD-10-CM | POA: Insufficient documentation

## 2014-12-06 DIAGNOSIS — R0602 Shortness of breath: Secondary | ICD-10-CM | POA: Diagnosis present

## 2014-12-06 DIAGNOSIS — Z79899 Other long term (current) drug therapy: Secondary | ICD-10-CM | POA: Insufficient documentation

## 2014-12-06 DIAGNOSIS — R079 Chest pain, unspecified: Secondary | ICD-10-CM | POA: Diagnosis not present

## 2014-12-06 LAB — BASIC METABOLIC PANEL
Anion gap: 9 (ref 5–15)
BUN: 16 mg/dL (ref 6–20)
CO2: 27 mmol/L (ref 22–32)
CREATININE: 0.68 mg/dL (ref 0.61–1.24)
Calcium: 9.1 mg/dL (ref 8.9–10.3)
Chloride: 97 mmol/L — ABNORMAL LOW (ref 101–111)
GFR calc Af Amer: >60 mL/min (ref >60)
Glucose, Bld: 126 mg/dL — ABNORMAL HIGH (ref 65–99)
Potassium: 4.2 mmol/L (ref 3.5–5.1)
SODIUM: 133 mmol/L — AB (ref 135–145)

## 2014-12-06 LAB — CBC WITH DIFFERENTIAL/PLATELET
BASOS ABS: 0.2 10*3/uL — AB (ref 0–0.1)
Basophils Relative: 1 %
EOS PCT: 0 %
Eosinophils Absolute: 0 10*3/uL (ref 0–0.7)
HCT: 35.4 % — ABNORMAL LOW (ref 40.0–52.0)
HEMOGLOBIN: 11.4 g/dL — AB (ref 13.0–18.0)
LYMPHS ABS: 0.3 10*3/uL — AB (ref 1.0–3.6)
Lymphocytes Relative: 1 %
MCH: 33.5 pg (ref 26.0–34.0)
MCHC: 32.2 g/dL (ref 32.0–36.0)
MCV: 104 fL — ABNORMAL HIGH (ref 80.0–100.0)
Monocytes Absolute: 0.9 10*3/uL (ref 0.2–1.0)
Monocytes Relative: 4 %
NEUTROS PCT: 94 %
Neutro Abs: 22 10*3/uL — ABNORMAL HIGH (ref 1.4–6.5)
Platelets: 172 10*3/uL (ref 150–440)
RBC: 3.41 MIL/uL — ABNORMAL LOW (ref 4.40–5.90)
RDW: 24.1 % — ABNORMAL HIGH (ref 11.5–14.5)
WBC: 23.5 10*3/uL — ABNORMAL HIGH (ref 3.8–10.6)

## 2014-12-06 LAB — TROPONIN I: TROPONIN I: 0.1 ng/mL — AB (ref <0.031)

## 2014-12-06 MED ORDER — HYDROMORPHONE HCL 1 MG/ML IJ SOLN
INTRAMUSCULAR | Status: AC
  Filled 2014-12-06: qty 1

## 2014-12-06 MED ORDER — HEPARIN (PORCINE) IN NACL 100-0.45 UNIT/ML-% IJ SOLN
1400.0000 [IU]/h | INTRAMUSCULAR | Status: DC
  Administered 2014-12-06: 1400 [IU]/h via INTRAVENOUS
  Filled 2014-12-06 (×2): qty 250

## 2014-12-06 MED ORDER — HYDROMORPHONE HCL 1 MG/ML IJ SOLN
1.0000 mg | Freq: Once | INTRAMUSCULAR | Status: AC
  Administered 2014-12-06: 1 mg via INTRAVENOUS

## 2014-12-06 MED ORDER — HEPARIN BOLUS VIA INFUSION
5000.0000 [IU] | Freq: Once | INTRAVENOUS | Status: AC
  Administered 2014-12-06: 5000 [IU] via INTRAVENOUS
  Filled 2014-12-06: qty 5000

## 2014-12-06 MED ORDER — SODIUM CHLORIDE 0.9 % IV BOLUS (SEPSIS)
1000.0000 mL | Freq: Once | INTRAVENOUS | Status: AC
  Administered 2014-12-06: 1000 mL via INTRAVENOUS

## 2014-12-06 MED ORDER — HEPARIN SODIUM (PORCINE) 5000 UNIT/ML IJ SOLN: 4000.0000 [IU] | Freq: Once | INTRAMUSCULAR | Status: DC

## 2014-12-06 MED ORDER — IOHEXOL 350 MG/ML SOLN
100.0000 mL | Freq: Once | INTRAVENOUS | Status: AC | PRN
  Administered 2014-12-06: 100 mL via INTRAVENOUS

## 2014-12-06 MED ORDER — HYDROMORPHONE HCL 1 MG/ML IJ SOLN
1.0000 mg | Freq: Once | INTRAMUSCULAR | Status: AC
  Administered 2014-12-06: 1 mg via INTRAVENOUS
  Filled 2014-12-06: qty 1

## 2014-12-06 NOTE — Consult Note (Addendum)
ANTICOAGULATION CONSULT NOTE - Initial Consult  Pharmacy Consult for Heparin Indication: pulmonary embolus  Allergies  Allergen Reactions  . Azithromycin Other (See Comments)    Pt states he gets real flushed red and sweaty.  . Penicillins Hives    Childhood reaction reported to patient by mother.  . Sulfa Antibiotics     Childhood reactions  . Erythromycin Base Swelling    Patient Measurements: Height: 6\' 1"  (185.4 cm) Weight: 180 lb (81.647 kg) IBW/kg (Calculated) : 79.9 Heparin Dosing Weight: 81.6kg  Vital Signs: Temp: 98 F (36.7 C) (07/25 0946) Temp Source: Oral (07/25 0946) BP: 95/38 mmHg (07/25 1134) Pulse Rate: 112 (07/25 1134)  Labs:  Recent Labs  12/06/14 0959  HGB 11.4*  HCT 35.4*  PLT 172  CREATININE 0.68  TROPONINI 0.10*    Estimated Creatinine Clearance: 127.6 mL/min (by C-G formula based on Cr of 0.68).   Medical History: Past Medical History  Diagnosis Date  . Allergy   . Cancer     tongue and neck  . DVT (deep venous thrombosis)     left arm and left leg    Medications:  Scheduled:  . heparin  4,000 Units Intravenous Once  . HYDROmorphone        Assessment: 49 year old male with PE treated with Heparin Goal of Therapy:  Heparin level 0.3-0.7 units/ml Monitor platelets by anticoagulation protocol: Yes   Plan:  Give 5000 units bolus x 1 Start heparin infusion at 1400 units/hr Check anti-Xa level in 8 hours and daily while on heparin Continue to monitor H&H and platelets Follow up with heparin level in 6 hrs **  Brinleigh Tew D Carlisle Torgeson, Pharm.D Clinical Pharmacist  12/06/2014,12:36 PM

## 2014-12-06 NOTE — Progress Notes (Signed)
°   12/06/14 1015  Clinical Encounter Type  Visited With Patient and family together  Visit Type Follow-up  Spiritual Encounters  Spiritual Needs Prayer (Prayer and emotional and grief support provided.)  Advance Directives (For Healthcare)  Does patient have an advance directive? No  Would patient like information on creating an advanced directive? No - patient declined information

## 2014-12-06 NOTE — ED Notes (Signed)
Lab called with elevated troponin of 0.10, advised Dr. Archie Balboa and he acknowledged result

## 2014-12-06 NOTE — ED Provider Notes (Signed)
[facility] Emergency Department Provider Note   ____________________________________________  Time seen: On EMS arrival  I have reviewed the triage vital signs and the nursing notes.   HISTORY  Chief Complaint Shortness of Breath   History limited by: Not Limited   HPI Corey Sims is a 49 y.o. male with history of oral carcinoma who presents to the emergency department today because ofshortness of breath. Patient states that this started this morning after having a bowel movement. He describes profound shortness of breath with exertion. No new chest pain. 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 The patient denies any fevers.    Past Medical History  Diagnosis Date  . Allergy   . Cancer     tongue and neck  . DVT (deep venous thrombosis)     left arm and left leg    Patient Active Problem List   Diagnosis Date Noted  . Intractable pain 11/24/2014  . Leg pain 09/26/2014  . Bilateral leg weakness 09/25/2014  . Hyponatremia 09/25/2014  . 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 Primary cancer of tongue 08/26/2012  . H/O tongue cancer 08/31/2011  . LAD (lymphadenopathy), mediastinal 08/31/2011    Past Surgical History  Procedure Laterality Date  . Excision of tongue lesion with laser  08/13/11  . Neck dissection Bilateral 08/13/11  . Thoracotomy    . Peripheral vascular catheterization  10/27/2014    Procedure: PICC Line Insertion;  Surgeon: Katha Cabal, MD;  Location: [facility];  Service: Cardiovascular;;    Current Outpatient Rx  Name  Route  Sig  Dispense  Refill  . azelastine (ASTELIN) 0.1 % nasal spray   Nasal   Place 1 spray into the nose 2 (two) times daily as needed. For allergies         . calcium-vitamin D (OSCAL WITH D) 500-200 MG-UNIT per tablet   Oral   Take 1 tablet by mouth 2 (two)  times daily. Patient not taking: Reported on 11/25/2014   60 tablet   3   . cefTRIAXone 2 g in dextrose 5 % 50 mL   Intravenous   Inject 2 g into the vein daily.   1 Dose   0   . citalopram (CELEXA) 40 MG tablet   Oral   Take 1 tablet (40 mg total) by mouth daily.   30 tablet   5   . dexamethasone (DECADRON) 6 MG tablet   Oral   Take 1 tablet (6 mg total) by mouth every 12 (twelve) hours.   45 tablet   1   . docusate sodium (COLACE) 100 MG capsule   Oral   Take 100 mg by mouth daily as needed. for constipation.         . fentaNYL (DURAGESIC - DOSED MCG/HR) 75 MCG/HR   Transdermal   Place 3 patches (225 mcg total) onto the skin every other day.   45 patch   0   . fluticasone (FLONASE) 50 MCG/ACT nasal spray   Nasal   Place 2 sprays into the nose daily as needed. for allergies         . HYDROmorphone (DILAUDID) 2 MG tablet   Oral   Take 1 tablet (2 mg total) by mouth every 4 (four) hours as needed for severe pain.   60 tablet   0   . HYDROmorphone (DILAUDID) 2  MG tablet      Take 3 tabs (6mg ) every 4 hours (6 times per day) May also take 2-3 tabs every 3 hours for breakthrough pain   200 tablet   0   . metroNIDAZOLE (FLAGYL) 500 MG tablet   Oral   Take 1 tablet (500 mg total) by mouth 3 (three) times daily.   15 tablet   0   . omeprazole (PRILOSEC OTC) 20 MG tablet   Oral   Take 20 mg by mouth every morning.         . polyethylene glycol (MIRALAX / GLYCOLAX) packet   Oral   Take 17 g by mouth daily.   14 each   0   . pregabalin (LYRICA) 200 MG capsule   Oral   Take 1 capsule (200 mg total) by mouth 2 (two) times daily.   60 capsule   2   . pregabalin (LYRICA) 200 MG capsule   Oral   Take 1 capsule (200 mg total) by mouth 2 (two) times daily.   60 capsule   2   . senna-docusate (SENOKOT-S) 8.6-50 MG per tablet   Oral   Take 2 tablets by mouth 2 (two) times daily.   120 tablet   2     Allergies Azithromycin; Penicillins; Sulfa  antibiotics; and Erythromycin base  Family History  Problem Relation Age of Onset  . Cancer Father 42    prostate     Social History History  Substance Use Topics  . Smoking status: Never Smoker   . Smokeless tobacco: Not on file  . Alcohol Use: No     Comment: very limited    Review of Systems  Constitutional: Negative for fever. Cardiovascular: Positive for chest pain. Respiratory: Positive for shortness of breath. Gastrointestinal: Negative for abdominal pain, vomiting and diarrhea. Genitourinary: Negative for dysuria. Musculoskeletal: Negative for back pain. Skin: Negative for rash. Neurological: Negative for headaches, focal weakness or numbness.  10-point ROS otherwise negative.  ____________________________________________   PHYSICAL EXAM:  VITAL SIGNS:  98 F (36.7 C)   121   22  94/71 mmHg  96 %  2 L/min     Constitutional: Alert and oriented. Appears in moderate respiratory distress and uncomfortable. Eyes: Conjunctivae are normal. PERRL. Normal extraocular movements. ENT   Head: Normocephalic and atraumatic.   Nose: No congestion/rhinnorhea.   Mouth/Throat: Mucous membranes are moist.   Neck: No stridor. Patient does have erosive wounds to the base of his neck primarily on the left side. No active bleeding appreciated. Cardiovascular: Tachycardic, regular rhythm.  No murmurs, rubs, or gallops. Respiratory: Increased respiratory effort. Question minimal wheezing in the bases. Gastrointestinal: Soft and nontender. No distention.  Genitourinary: Deferred Musculoskeletal: Normal range of motion in all extremities. No joint effusions.  No lower extremity tenderness nor edema. Neurologic:  Normal speech and language. No gross focal neurologic deficits are appreciated. Speech is normal.  Skin:  Skin is warm, dry and intact. No rash noted. Psychiatric: Mood and affect are normal. Speech and behavior are normal. Patient exhibits appropriate  insight and judgment.  ____________________________________________    LABS (pertinent positives/negatives)  Labs Reviewed  CBC WITH DIFFERENTIAL/PLATELET - Abnormal; Notable for the following:    WBC 23.5 (*)    RBC 3.41 (*)    Hemoglobin 11.4 (*)    HCT 35.4 (*)    MCV 104.0 (*)    RDW 24.1 (*)    Neutro Abs 22.0 (*)    Lymphs Abs 0.3 (*)  Basophils Absolute 0.2 (*)    All other components within normal limits  BASIC METABOLIC PANEL - Abnormal; Notable for the following:    Sodium 133 (*)    Chloride 97 (*)    Glucose, Bld 126 (*)    All other components within normal limits  TROPONIN I - Abnormal; Notable for the following:    Troponin I 0.10 (*)    All other components within normal limits  PROTIME-INR  APTT  HEPARIN LEVEL (UNFRACTIONATED)     ____________________________________________  EKG  I, Nance Pear, attending physician, personally viewed and interpreted this EKG  EKG Time: 0941 Rate: 126 Rhythm: sinus tachycardia Axis: normal Intervals: qtc 434 QRS: narrow ST changes: no st elevation ____________________________________________    RADIOLOGY  CT angiogram RADIOLOGY INTERPRETATION  I, Nance Pear, attending physician, personally viewed and interpreted these images: Location: Chest Type of study: CT angio Number of views: multiple slices Pertinent positive/negative findings: large bilateral pulmonary emboli Clinical Impression: pulmonary emboli   ____________________________________________   PROCEDURES  Procedure(s) performed: None  Critical Care performed: Yes, see critical care note(s)   CRITICAL CARE Performed by: Nance Pear   Total critical care time: 90  Critical care time was exclusive of separately billable procedures and treating other patients.  Critical care was necessary to treat or prevent imminent or life-threatening deterioration.  Critical care was time spent personally by me on the following  activities: development of treatment plan with patient and/or surrogate as well as nursing, discussions with consultants, evaluation of patient's response to treatment, examination of patient, obtaining history from patient or surrogate, ordering and performing treatments and interventions, ordering and review of laboratory studies, ordering and review of radiographic studies, pulse oximetry and re-evaluation of patient's condition.   ____________________________________________   INITIAL IMPRESSION / ASSESSMENT AND PLAN / ED COURSE  Pertinent labs & imaging results that were available during my care of the patient were reviewed by me and considered in my medical decision making (see chart for details).  Patient presented to the emergency department today because of shortness of breath. Patient is a cancer patient and does have history of DVTs in the upper extremity. Patient is currently not being anticoagulated secondary to bleeding from the erosive cancer. Of course pulmonary embolism was high on the differential the CT angiogram was performed. This did show multiple bilateral PEs with evidence of right heart strain. This was further evidenced in the blood work which did show elevated troponin. At this point feel patient would not benefit from thrombolysis here given erosive cancerous lesions. Will however start heparin. After discussion with the vascular surgeon here at [facility] the decision was made to attempt to transfer the patient to a facility that has potential for mechanical thrombectomy. I did discuss with vascular surgeon on-call at [facility]. They do have the capability of doing a mechanical thrombectomy however of course they would have to evaluate the patient first. Will transfer the patient to the emergency department at [facility] where he can have further evaluation and potentially undergo mechanical thrombectomy.   ____________________________________________   FINAL CLINICAL IMPRESSION(S) / ED  DIAGNOSES  Final diagnoses:  Pulmonary embolism     Nance Pear, MD 12/06/14 [phone]

## 2014-12-06 NOTE — ED Notes (Signed)
Pt to ED via EMS transport from home, pt has neck and throat CA, got up to have BM today and became sob, upon EMS arrival pt's initial o2 sat was 78 %, placed on 2 liters and increased to 92%, pt in no distress at time of arrival to ED, pale in color, has had some increased weakness recently, recently admitted in hospital on 7/13 for 3 days, during this time pt was taken off of lovenox, has blood clot in right arm, pt denies any new pain at present, last chemo treatment 6/25

## 2014-12-08 ENCOUNTER — Other Ambulatory Visit: Payer: BC Managed Care – PPO

## 2014-12-08 ENCOUNTER — Ambulatory Visit: Payer: BC Managed Care – PPO | Admitting: Oncology

## 2014-12-09 ENCOUNTER — Inpatient Hospital Stay: Payer: BC Managed Care – PPO | Admitting: Oncology

## 2014-12-09 ENCOUNTER — Inpatient Hospital Stay: Payer: BC Managed Care – PPO

## 2014-12-10 ENCOUNTER — Ambulatory Visit: Payer: BC Managed Care – PPO | Admitting: Surgery

## 2014-12-13 DEATH — deceased

## 2015-05-12 ENCOUNTER — Other Ambulatory Visit: Payer: Self-pay | Admitting: Nurse Practitioner

## 2015-11-12 IMAGING — US ULTRASOUND CORE BIOPSY
1 series · 12 of 12 positions shown · non-contrast
Comparison: none

CLINICAL DATA: Head and neck carcinoma post radiation therapy.
Cystic destructive lesion at the left sternoclavicular joint.

EXAM:
ULTRASOUND-GUIDED  ASPIRATION BIOPSY
TECHNIQUE: The procedure, risks (including but not limited to bleeding,
infection, organ damage ), benefits, and alternatives were explained
to the patient. Questions regarding the procedure were encouraged
and answered. The patient understands and consents to the procedure.

[Series 1: ultrasound core biopsy · 0.07mm/px · 12 of 12 slices shown]
[im 1/12]
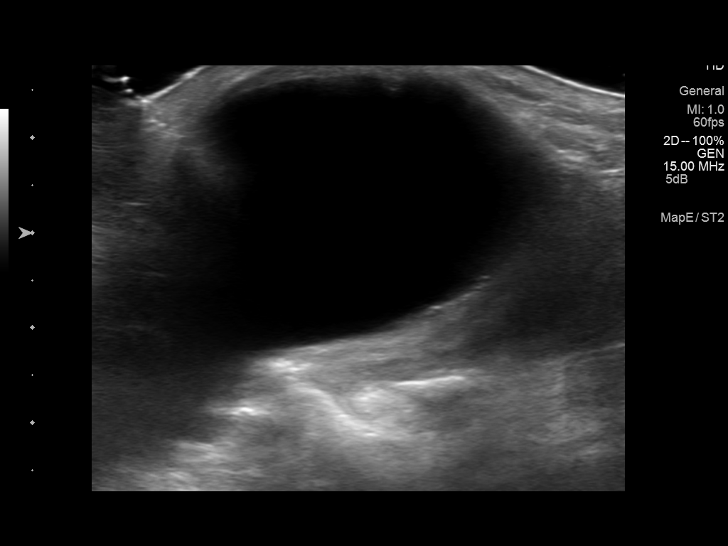
[im 2/12]
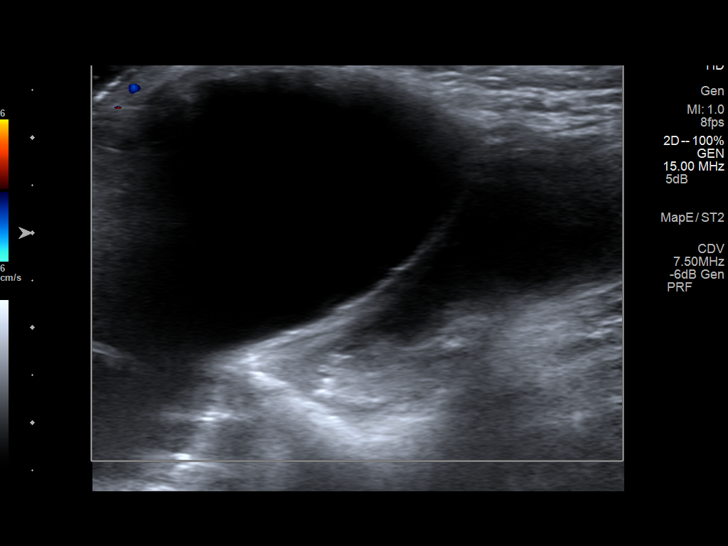
[im 3/12]
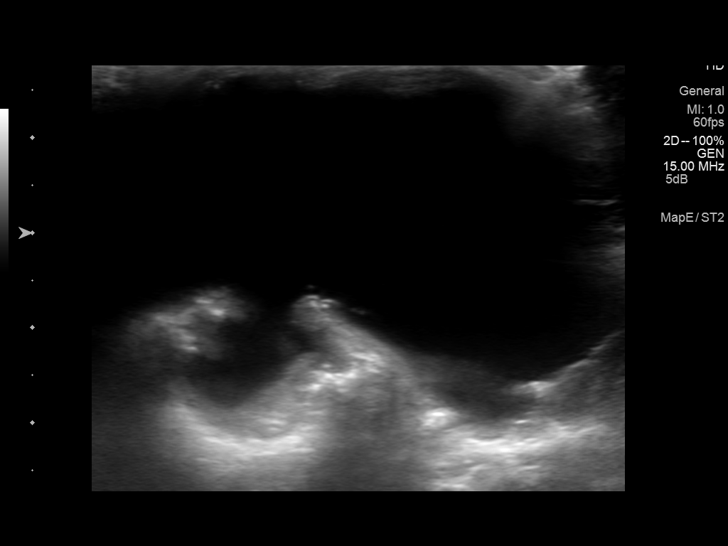
[im 4/12]
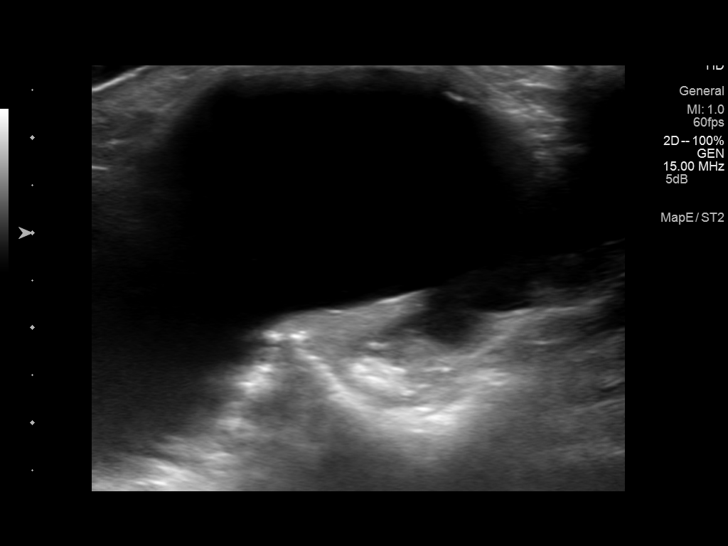
[im 5/12]
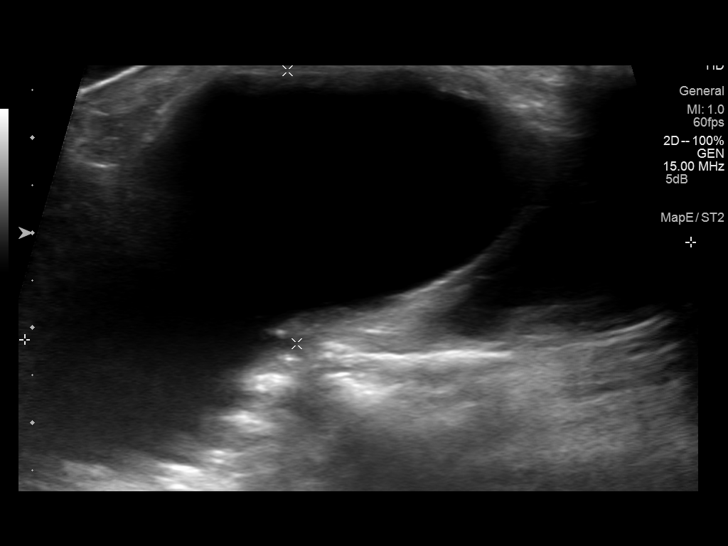
[im 6/12]
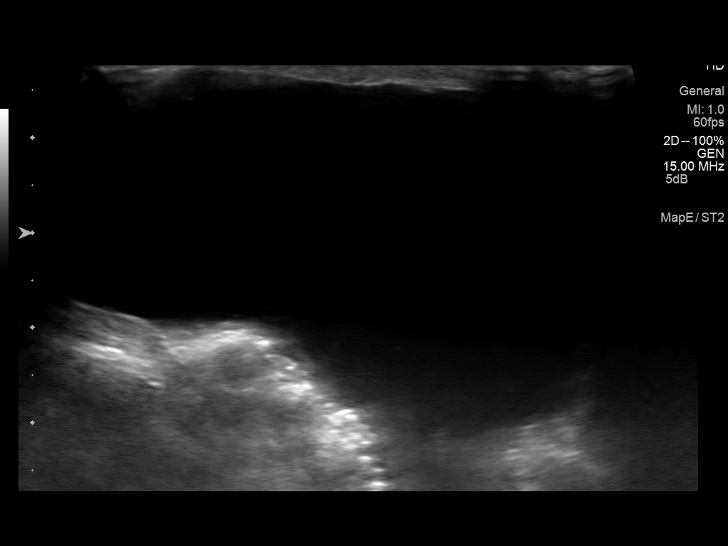
[im 7/12]
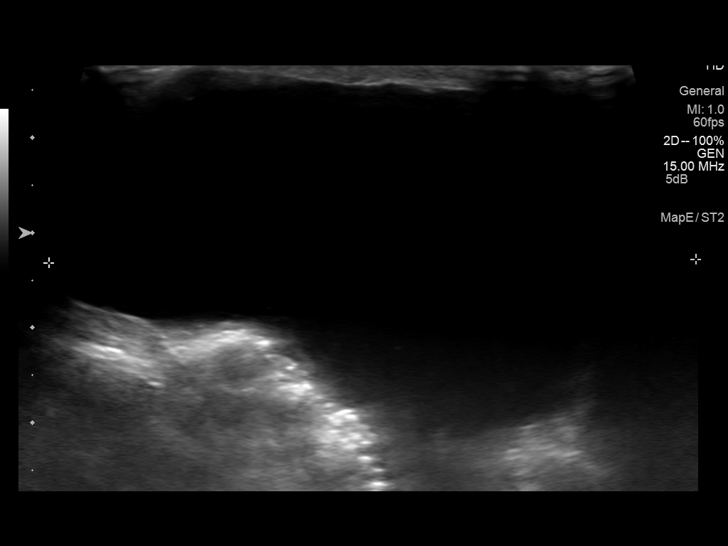
[im 8/12]
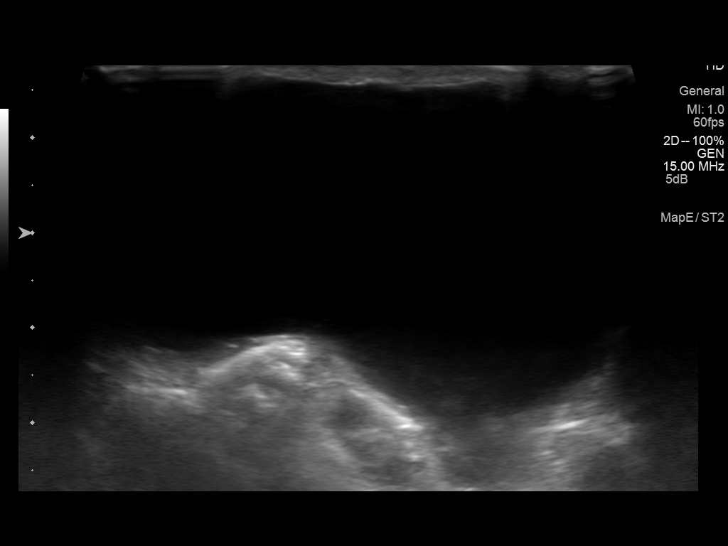
[im 9/12]
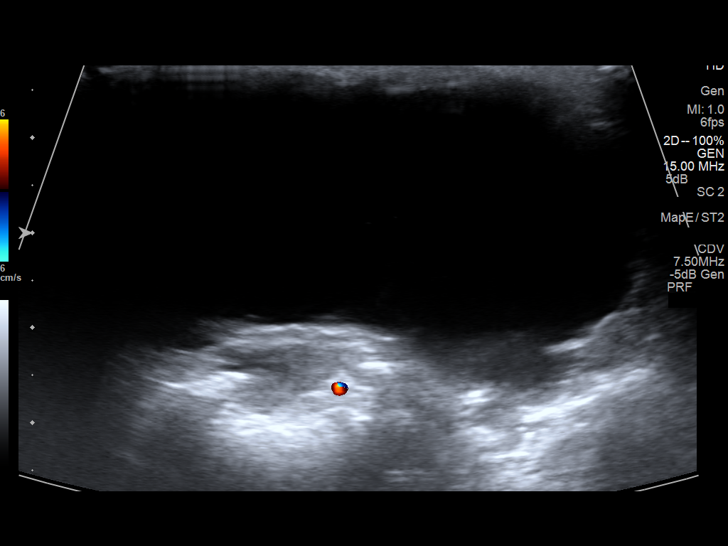
[im 10/12]
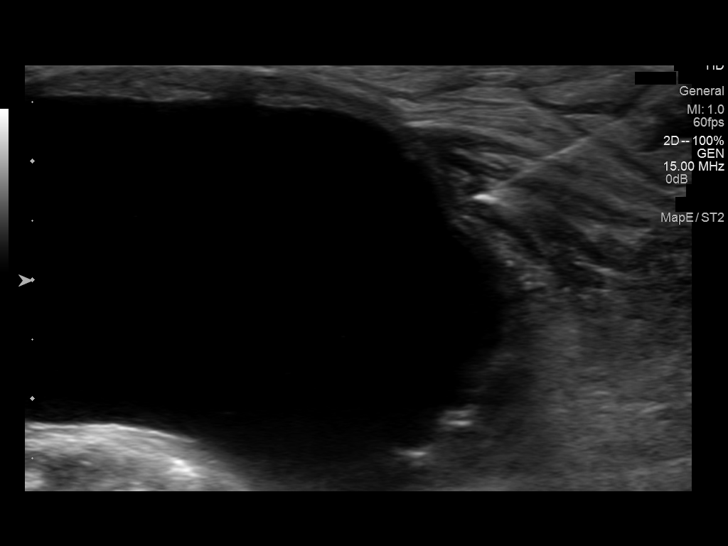
[im 11/12]
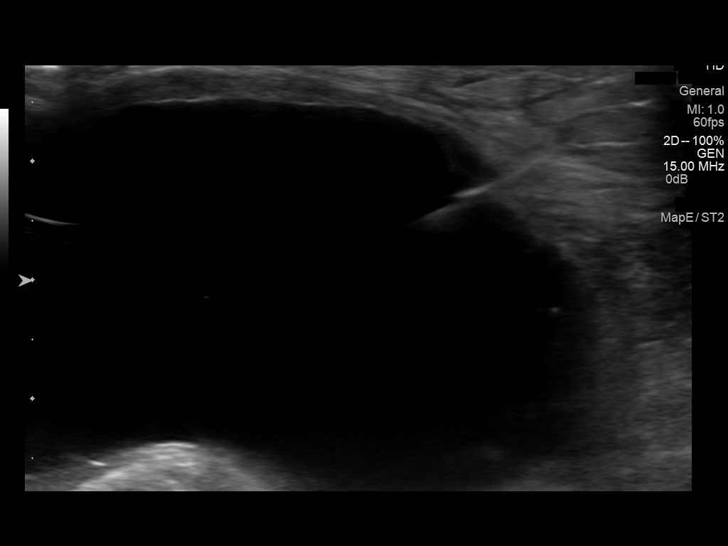
[im 12/12]
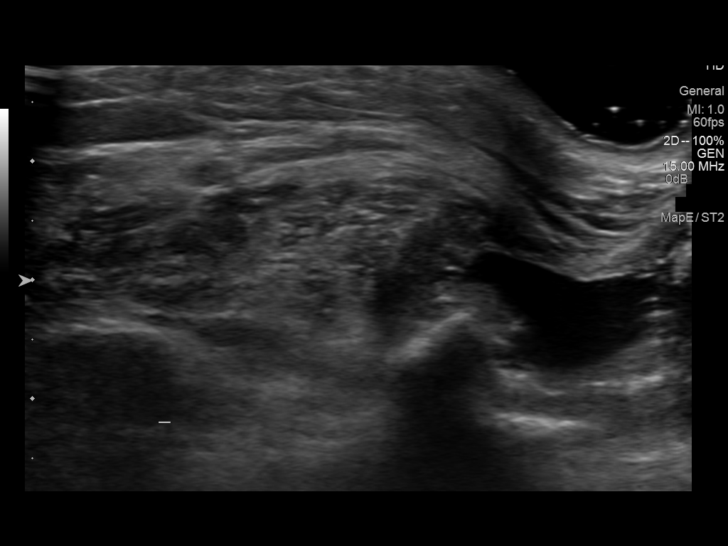

[12 of 12 positions shown; findings below may reference images not displayed]

Survey ultrasound was performed and the cystic lesion overlying the
left sternoclavicular joint was localized. An appropriate skin entry
site was determined. Skin was marked, then prepped with
chlorhexidine, draped in usual sterile fashion, and infiltrated
locally with 1% lidocaine. Under real-time ultrasound guidance, an
18 gauge needle was advanced into the collection and approximately 7
mL of clear amber fluid were aspirated, sample sent for cytology
analysis. Postprocedure scanning shows only a trace amount of
residual fluid. No significant nodularity or mass component. The
patient tolerated procedure well, with no immediate complications.
IMPRESSION: 1. Technically successful ultrasound-guided aspiration biopsy of
cystic left sternoclavicular lesion, sample sent for cytology
analysis.

## 2015-12-07 IMAGING — CR DG CHEST 1V PORT
1 series · 1 of 1 positions shown · non-contrast
Comparison: 05/04/2013

CLINICAL DATA: Weakness.  Neck cancer

EXAM:
PORTABLE CHEST - 1 VIEW

[ap]
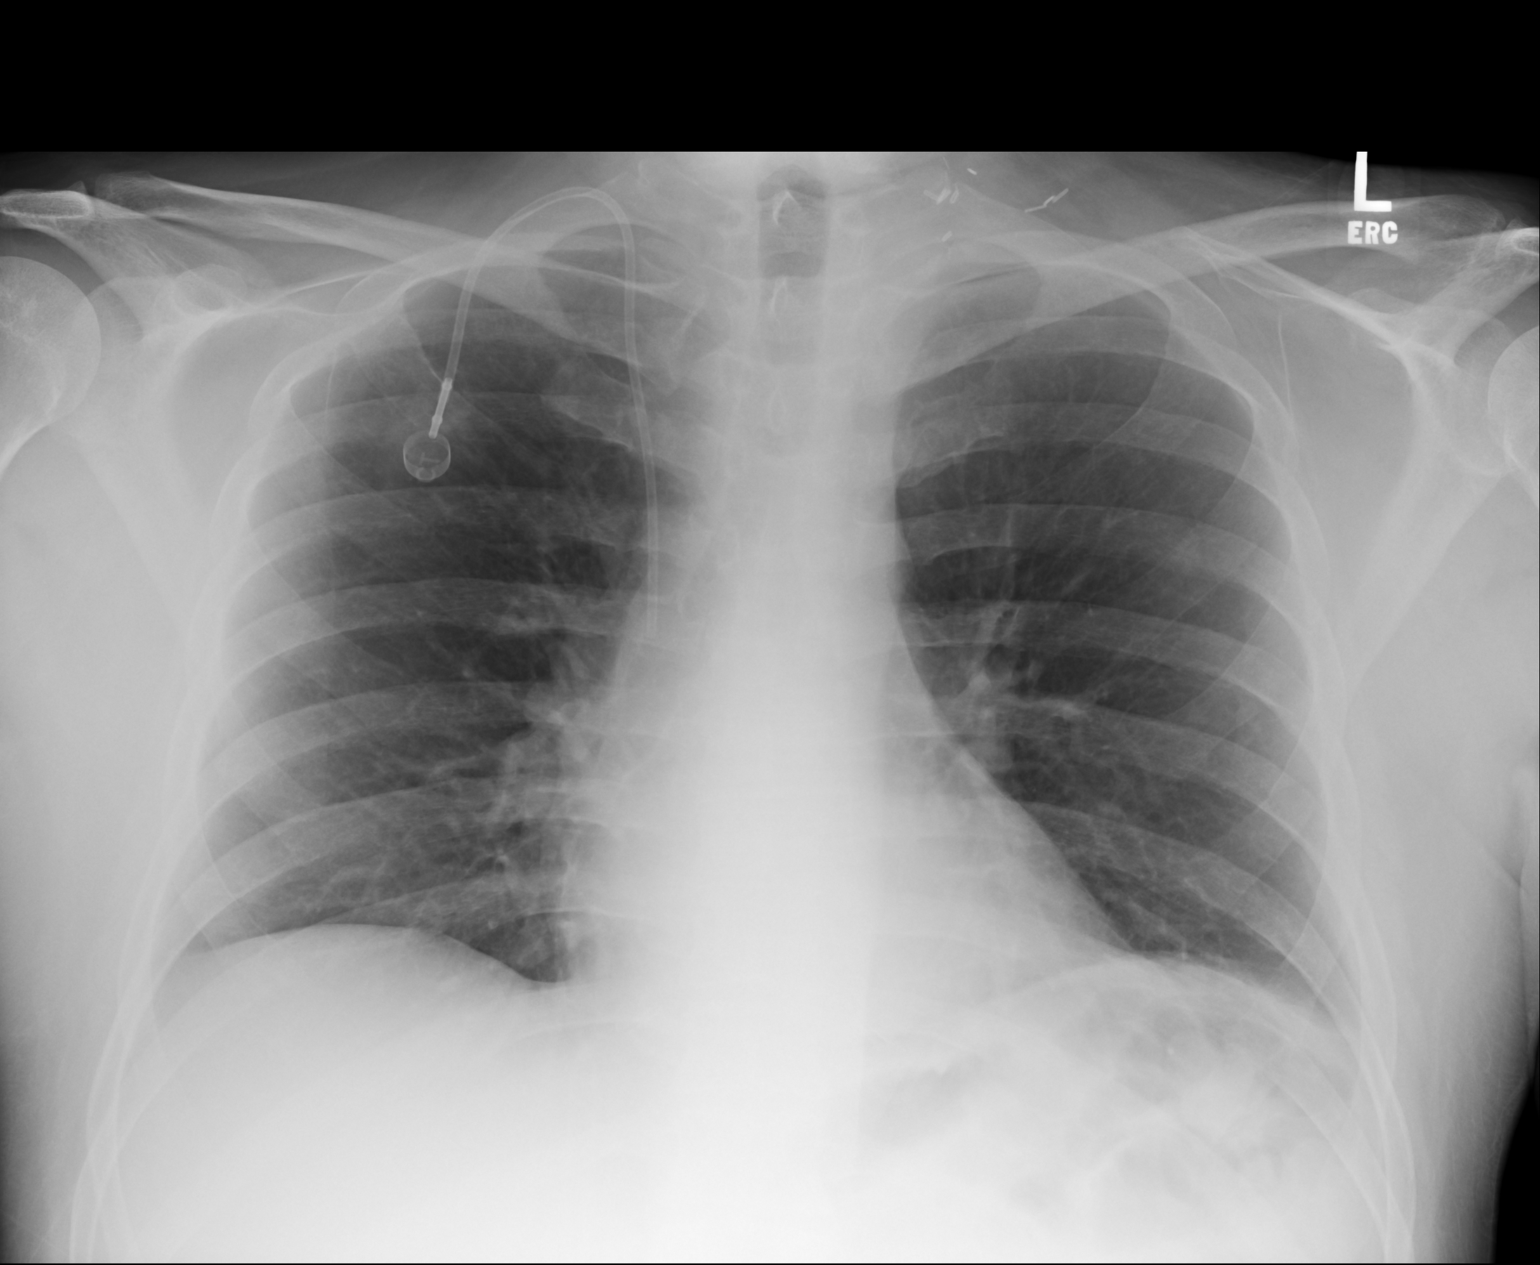

[1 of 1 positions shown; findings below may reference images not displayed]

FINDINGS: Right IJ porta catheter, tip at the mid SVC.

Normal heart size. Stable upper mediastinal contours. Known
bilateral pulmonary nodules are not well visualized. No
consolidation, edema, effusion, or pneumothorax. Surgical clips in
the left neck related to cancer treatment.
IMPRESSION: 1.  No active disease.
2. Known bilateral pulmonary nodules are not well visualized
radiographically.
